# Patient Record
Sex: Female | Born: 1940 | Race: White | Hispanic: No | Marital: Married | Smoking: Never smoker
Health system: Southern US, Community
[De-identification: ages and names within clinical notes are randomized; demographics above are authoritative.]

## PROBLEM LIST (undated history)

## (undated) DIAGNOSIS — E785 Hyperlipidemia, unspecified: Secondary | ICD-10-CM

## (undated) DIAGNOSIS — D219 Benign neoplasm of connective and other soft tissue, unspecified: Secondary | ICD-10-CM

## (undated) DIAGNOSIS — E039 Hypothyroidism, unspecified: Secondary | ICD-10-CM

## (undated) DIAGNOSIS — T7840XA Allergy, unspecified, initial encounter: Secondary | ICD-10-CM

## (undated) DIAGNOSIS — B009 Herpesviral infection, unspecified: Secondary | ICD-10-CM

## (undated) DIAGNOSIS — K589 Irritable bowel syndrome without diarrhea: Secondary | ICD-10-CM

## (undated) DIAGNOSIS — J45909 Unspecified asthma, uncomplicated: Secondary | ICD-10-CM

## (undated) DIAGNOSIS — I491 Atrial premature depolarization: Secondary | ICD-10-CM

## (undated) DIAGNOSIS — K5790 Diverticulosis of intestine, part unspecified, without perforation or abscess without bleeding: Secondary | ICD-10-CM

## (undated) DIAGNOSIS — R42 Dizziness and giddiness: Secondary | ICD-10-CM

## (undated) HISTORY — DX: Benign neoplasm of connective and other soft tissue, unspecified: D21.9

## (undated) HISTORY — DX: Hyperlipidemia, unspecified: E78.5

## (undated) HISTORY — DX: Unspecified asthma, uncomplicated: J45.909

## (undated) HISTORY — PX: CHOLECYSTECTOMY: SHX55

## (undated) HISTORY — DX: Herpesviral infection, unspecified: B00.9

## (undated) HISTORY — DX: Irritable bowel syndrome, unspecified: K58.9

## (undated) HISTORY — DX: Atrial premature depolarization: I49.1

## (undated) HISTORY — DX: Diverticulosis of intestine, part unspecified, without perforation or abscess without bleeding: K57.90

## (undated) HISTORY — DX: Dizziness and giddiness: R42

## (undated) HISTORY — PX: EYE SURGERY: SHX253

## (undated) HISTORY — DX: Allergy, unspecified, initial encounter: T78.40XA

## (undated) HISTORY — PX: ABDOMINAL HYSTERECTOMY: SHX81

## (undated) HISTORY — PX: CATARACT EXTRACTION: SUR2

## (undated) HISTORY — PX: SEPTOPLASTY: SUR1290

## (undated) HISTORY — PX: THIGH FASCIOTOMY: SHX2495

---

## 1970-10-26 HISTORY — PX: REPAIR FASCIAL DEFECT LEG: SUR1172

## 1998-08-14 ENCOUNTER — Ambulatory Visit (HOSPITAL_COMMUNITY): Admission: RE | Admit: 1998-08-14 | Discharge: 1998-08-14 | Payer: Self-pay | Admitting: Specialist

## 1998-10-23 ENCOUNTER — Ambulatory Visit (HOSPITAL_COMMUNITY): Admission: RE | Admit: 1998-10-23 | Discharge: 1998-10-23 | Payer: Self-pay | Admitting: Specialist

## 1999-01-15 ENCOUNTER — Ambulatory Visit (HOSPITAL_COMMUNITY): Admission: RE | Admit: 1999-01-15 | Discharge: 1999-01-15 | Payer: Self-pay | Admitting: Specialist

## 1999-10-27 HISTORY — PX: OTHER SURGICAL HISTORY: SHX169

## 2000-03-16 ENCOUNTER — Encounter (INDEPENDENT_AMBULATORY_CARE_PROVIDER_SITE_OTHER): Payer: Self-pay

## 2000-03-16 ENCOUNTER — Ambulatory Visit (HOSPITAL_COMMUNITY): Admission: RE | Admit: 2000-03-16 | Discharge: 2000-03-16 | Payer: Self-pay | Admitting: Gastroenterology

## 2000-10-14 ENCOUNTER — Other Ambulatory Visit: Admission: RE | Admit: 2000-10-14 | Discharge: 2000-10-14 | Payer: Self-pay | Admitting: Obstetrics and Gynecology

## 2001-11-14 ENCOUNTER — Other Ambulatory Visit: Admission: RE | Admit: 2001-11-14 | Discharge: 2001-11-14 | Payer: Self-pay | Admitting: Obstetrics and Gynecology

## 2001-12-23 ENCOUNTER — Emergency Department (HOSPITAL_COMMUNITY): Admission: EM | Admit: 2001-12-23 | Discharge: 2001-12-23 | Payer: Self-pay | Admitting: Emergency Medicine

## 2001-12-23 ENCOUNTER — Encounter: Payer: Self-pay | Admitting: Emergency Medicine

## 2002-05-19 ENCOUNTER — Encounter: Admission: RE | Admit: 2002-05-19 | Discharge: 2002-05-19 | Payer: Self-pay | Admitting: Obstetrics and Gynecology

## 2002-05-19 ENCOUNTER — Encounter: Payer: Self-pay | Admitting: Obstetrics and Gynecology

## 2003-05-22 ENCOUNTER — Encounter: Admission: RE | Admit: 2003-05-22 | Discharge: 2003-05-22 | Payer: Self-pay | Admitting: Obstetrics and Gynecology

## 2003-05-22 ENCOUNTER — Encounter: Payer: Self-pay | Admitting: Obstetrics and Gynecology

## 2004-06-11 ENCOUNTER — Encounter: Admission: RE | Admit: 2004-06-11 | Discharge: 2004-06-11 | Payer: Self-pay | Admitting: Obstetrics and Gynecology

## 2005-03-20 ENCOUNTER — Ambulatory Visit: Payer: Self-pay | Admitting: Internal Medicine

## 2005-07-27 ENCOUNTER — Encounter: Admission: RE | Admit: 2005-07-27 | Discharge: 2005-07-27 | Payer: Self-pay | Admitting: Obstetrics and Gynecology

## 2005-08-14 ENCOUNTER — Ambulatory Visit: Payer: Self-pay | Admitting: Internal Medicine

## 2005-09-04 ENCOUNTER — Ambulatory Visit: Payer: Self-pay | Admitting: Internal Medicine

## 2005-10-02 ENCOUNTER — Ambulatory Visit: Payer: Self-pay | Admitting: Internal Medicine

## 2006-03-19 ENCOUNTER — Ambulatory Visit: Payer: Self-pay | Admitting: Internal Medicine

## 2006-07-30 ENCOUNTER — Encounter: Admission: RE | Admit: 2006-07-30 | Discharge: 2006-07-30 | Payer: Self-pay | Admitting: Obstetrics and Gynecology

## 2007-08-02 DIAGNOSIS — J45909 Unspecified asthma, uncomplicated: Secondary | ICD-10-CM | POA: Insufficient documentation

## 2007-08-02 DIAGNOSIS — Z860100 Personal history of colon polyps, unspecified: Secondary | ICD-10-CM | POA: Insufficient documentation

## 2007-08-08 ENCOUNTER — Ambulatory Visit: Payer: Self-pay | Admitting: Internal Medicine

## 2007-08-08 DIAGNOSIS — E785 Hyperlipidemia, unspecified: Secondary | ICD-10-CM

## 2007-08-08 DIAGNOSIS — R5381 Other malaise: Secondary | ICD-10-CM | POA: Insufficient documentation

## 2007-08-08 DIAGNOSIS — R5383 Other fatigue: Secondary | ICD-10-CM

## 2007-08-08 DIAGNOSIS — E7800 Pure hypercholesterolemia, unspecified: Secondary | ICD-10-CM | POA: Insufficient documentation

## 2007-08-08 DIAGNOSIS — M25559 Pain in unspecified hip: Secondary | ICD-10-CM

## 2007-08-08 LAB — CONVERTED CEMR LAB
Blood in Urine, dipstick: NEGATIVE
Protein, U semiquant: NEGATIVE
Specific Gravity, Urine: 1.02
Urobilinogen, UA: NEGATIVE
WBC Urine, dipstick: NEGATIVE

## 2007-08-09 ENCOUNTER — Encounter: Admission: RE | Admit: 2007-08-09 | Discharge: 2007-08-09 | Payer: Self-pay | Admitting: Obstetrics and Gynecology

## 2007-08-09 ENCOUNTER — Ambulatory Visit: Payer: Self-pay | Admitting: Internal Medicine

## 2007-08-10 ENCOUNTER — Encounter (INDEPENDENT_AMBULATORY_CARE_PROVIDER_SITE_OTHER): Payer: Self-pay | Admitting: *Deleted

## 2007-08-29 ENCOUNTER — Telehealth (INDEPENDENT_AMBULATORY_CARE_PROVIDER_SITE_OTHER): Payer: Self-pay | Admitting: *Deleted

## 2007-09-05 ENCOUNTER — Telehealth (INDEPENDENT_AMBULATORY_CARE_PROVIDER_SITE_OTHER): Payer: Self-pay | Admitting: *Deleted

## 2007-09-05 ENCOUNTER — Encounter (INDEPENDENT_AMBULATORY_CARE_PROVIDER_SITE_OTHER): Payer: Self-pay | Admitting: *Deleted

## 2007-12-09 ENCOUNTER — Ambulatory Visit: Payer: Self-pay | Admitting: Internal Medicine

## 2007-12-16 ENCOUNTER — Telehealth (INDEPENDENT_AMBULATORY_CARE_PROVIDER_SITE_OTHER): Payer: Self-pay | Admitting: *Deleted

## 2007-12-19 ENCOUNTER — Encounter (INDEPENDENT_AMBULATORY_CARE_PROVIDER_SITE_OTHER): Payer: Self-pay | Admitting: *Deleted

## 2007-12-19 LAB — CONVERTED CEMR LAB
ALT: 25 units/L (ref 0–35)
AST: 25 units/L (ref 0–37)
Albumin: 3.7 g/dL (ref 3.5–5.2)
Alkaline Phosphatase: 46 units/L (ref 39–117)
LDL Cholesterol: 103 mg/dL — ABNORMAL HIGH (ref 0–99)
Total CHOL/HDL Ratio: 3.6
Total Protein: 6.7 g/dL (ref 6.0–8.3)
Triglycerides: 123 mg/dL (ref 0–149)
VLDL: 25 mg/dL (ref 0–40)

## 2008-02-14 ENCOUNTER — Telehealth (INDEPENDENT_AMBULATORY_CARE_PROVIDER_SITE_OTHER): Payer: Self-pay | Admitting: *Deleted

## 2008-03-30 ENCOUNTER — Ambulatory Visit: Payer: Self-pay | Admitting: Internal Medicine

## 2008-05-28 ENCOUNTER — Telehealth: Payer: Self-pay | Admitting: Internal Medicine

## 2008-06-06 ENCOUNTER — Ambulatory Visit: Payer: Self-pay | Admitting: Internal Medicine

## 2008-06-10 LAB — CONVERTED CEMR LAB
Cholesterol: 184 mg/dL (ref 0–200)
HDL: 40.5 mg/dL (ref >39.0)
LDL Cholesterol: 119 mg/dL — ABNORMAL HIGH (ref 0–99)
TSH: 4.07 microintl units/mL (ref 0.35–5.50)
Triglycerides: 123 mg/dL (ref 0–149)

## 2008-06-11 ENCOUNTER — Encounter (INDEPENDENT_AMBULATORY_CARE_PROVIDER_SITE_OTHER): Payer: Self-pay | Admitting: *Deleted

## 2008-06-14 ENCOUNTER — Ambulatory Visit: Payer: Self-pay | Admitting: Internal Medicine

## 2008-06-22 ENCOUNTER — Encounter: Payer: Self-pay | Admitting: Internal Medicine

## 2008-07-06 ENCOUNTER — Encounter: Payer: Self-pay | Admitting: Internal Medicine

## 2008-07-13 ENCOUNTER — Telehealth (INDEPENDENT_AMBULATORY_CARE_PROVIDER_SITE_OTHER): Payer: Self-pay | Admitting: *Deleted

## 2008-07-26 ENCOUNTER — Telehealth (INDEPENDENT_AMBULATORY_CARE_PROVIDER_SITE_OTHER): Payer: Self-pay | Admitting: *Deleted

## 2008-08-15 ENCOUNTER — Encounter: Admission: RE | Admit: 2008-08-15 | Discharge: 2008-08-15 | Payer: Self-pay | Admitting: Obstetrics and Gynecology

## 2008-09-25 ENCOUNTER — Telehealth (INDEPENDENT_AMBULATORY_CARE_PROVIDER_SITE_OTHER): Payer: Self-pay | Admitting: *Deleted

## 2008-10-01 ENCOUNTER — Telehealth (INDEPENDENT_AMBULATORY_CARE_PROVIDER_SITE_OTHER): Payer: Self-pay | Admitting: *Deleted

## 2008-10-15 ENCOUNTER — Ambulatory Visit: Payer: Self-pay | Admitting: Internal Medicine

## 2008-10-16 ENCOUNTER — Telehealth (INDEPENDENT_AMBULATORY_CARE_PROVIDER_SITE_OTHER): Payer: Self-pay | Admitting: *Deleted

## 2008-10-16 LAB — CONVERTED CEMR LAB
ALT: 32 units/L (ref 0–35)
Alkaline Phosphatase: 59 units/L (ref 39–117)
CO2: 29 meq/L (ref 19–32)
Chloride: 108 meq/L (ref 96–112)
Creatinine, Ser: 0.8 mg/dL (ref 0.4–1.2)
GFR calc Af Amer: 93 mL/min
HDL: 50 mg/dL (ref >39.0)
LDL Cholesterol: 116 mg/dL — ABNORMAL HIGH (ref 0–99)
Potassium: 4.1 meq/L (ref 3.5–5.1)
Sodium: 142 meq/L (ref 135–145)
TSH: 0.09 microintl units/mL — ABNORMAL LOW (ref 0.35–5.50)
Total CHOL/HDL Ratio: 3.9

## 2008-10-18 ENCOUNTER — Ambulatory Visit: Payer: Self-pay | Admitting: Internal Medicine

## 2008-10-18 DIAGNOSIS — R946 Abnormal results of thyroid function studies: Secondary | ICD-10-CM

## 2008-10-18 DIAGNOSIS — K5730 Diverticulosis of large intestine without perforation or abscess without bleeding: Secondary | ICD-10-CM | POA: Insufficient documentation

## 2008-10-18 LAB — CONVERTED CEMR LAB
HDL goal, serum: 50 mg/dL
LDL Goal: 100 mg/dL

## 2008-10-31 ENCOUNTER — Telehealth (INDEPENDENT_AMBULATORY_CARE_PROVIDER_SITE_OTHER): Payer: Self-pay | Admitting: *Deleted

## 2008-12-19 ENCOUNTER — Ambulatory Visit: Payer: Self-pay | Admitting: Internal Medicine

## 2008-12-19 LAB — CONVERTED CEMR LAB
ALT: 21 units/L (ref 0–35)
AST: 21 units/L (ref 0–37)
Albumin: 3.9 g/dL (ref 3.5–5.2)
Alkaline Phosphatase: 45 units/L (ref 39–117)
Bilirubin, Direct: 0.1 mg/dL (ref 0.0–0.3)
Cholesterol: 207 mg/dL (ref 0–200)
Direct LDL: 123.6 mg/dL
Free T4: 0.7 ng/dL (ref 0.6–1.6)
HDL: 50.8 mg/dL (ref >39.0)
T3, Free: 3.1 pg/mL (ref 2.3–4.2)
TSH: 12.27 microintl units/mL — ABNORMAL HIGH (ref 0.35–5.50)
Total Bilirubin: 0.7 mg/dL (ref 0.3–1.2)
Total CHOL/HDL Ratio: 4.1
Total Protein: 6.9 g/dL (ref 6.0–8.3)
Triglycerides: 128 mg/dL (ref 0–149)
VLDL: 26 mg/dL (ref 0–40)

## 2008-12-20 ENCOUNTER — Ambulatory Visit: Payer: Self-pay | Admitting: Internal Medicine

## 2008-12-25 ENCOUNTER — Telehealth (INDEPENDENT_AMBULATORY_CARE_PROVIDER_SITE_OTHER): Payer: Self-pay | Admitting: *Deleted

## 2009-01-08 ENCOUNTER — Telehealth (INDEPENDENT_AMBULATORY_CARE_PROVIDER_SITE_OTHER): Payer: Self-pay | Admitting: *Deleted

## 2009-01-14 ENCOUNTER — Telehealth (INDEPENDENT_AMBULATORY_CARE_PROVIDER_SITE_OTHER): Payer: Self-pay | Admitting: *Deleted

## 2009-04-05 ENCOUNTER — Ambulatory Visit: Payer: Self-pay | Admitting: Internal Medicine

## 2009-04-06 LAB — CONVERTED CEMR LAB: TSH: 2.33 microintl units/mL (ref 0.35–5.50)

## 2009-04-08 ENCOUNTER — Encounter (INDEPENDENT_AMBULATORY_CARE_PROVIDER_SITE_OTHER): Payer: Self-pay | Admitting: *Deleted

## 2009-04-15 ENCOUNTER — Telehealth (INDEPENDENT_AMBULATORY_CARE_PROVIDER_SITE_OTHER): Payer: Self-pay | Admitting: *Deleted

## 2009-05-10 ENCOUNTER — Ambulatory Visit: Payer: Self-pay | Admitting: Internal Medicine

## 2009-06-03 ENCOUNTER — Telehealth (INDEPENDENT_AMBULATORY_CARE_PROVIDER_SITE_OTHER): Payer: Self-pay | Admitting: *Deleted

## 2009-06-07 ENCOUNTER — Telehealth (INDEPENDENT_AMBULATORY_CARE_PROVIDER_SITE_OTHER): Payer: Self-pay | Admitting: *Deleted

## 2009-07-09 ENCOUNTER — Telehealth (INDEPENDENT_AMBULATORY_CARE_PROVIDER_SITE_OTHER): Payer: Self-pay | Admitting: *Deleted

## 2009-08-09 ENCOUNTER — Encounter: Payer: Self-pay | Admitting: Internal Medicine

## 2009-08-20 ENCOUNTER — Encounter: Admission: RE | Admit: 2009-08-20 | Discharge: 2009-08-20 | Payer: Self-pay | Admitting: Obstetrics and Gynecology

## 2009-09-16 ENCOUNTER — Ambulatory Visit: Payer: Self-pay | Admitting: Internal Medicine

## 2009-09-18 ENCOUNTER — Telehealth (INDEPENDENT_AMBULATORY_CARE_PROVIDER_SITE_OTHER): Payer: Self-pay | Admitting: *Deleted

## 2009-09-24 LAB — CONVERTED CEMR LAB: TSH: 2.81 microintl units/mL (ref 0.35–5.50)

## 2009-09-25 ENCOUNTER — Encounter: Payer: Self-pay | Admitting: Internal Medicine

## 2009-10-29 ENCOUNTER — Encounter (INDEPENDENT_AMBULATORY_CARE_PROVIDER_SITE_OTHER): Payer: Self-pay | Admitting: *Deleted

## 2009-10-29 ENCOUNTER — Telehealth (INDEPENDENT_AMBULATORY_CARE_PROVIDER_SITE_OTHER): Payer: Self-pay | Admitting: *Deleted

## 2009-11-11 ENCOUNTER — Ambulatory Visit: Payer: Self-pay | Admitting: Internal Medicine

## 2009-12-10 ENCOUNTER — Telehealth (INDEPENDENT_AMBULATORY_CARE_PROVIDER_SITE_OTHER): Payer: Self-pay | Admitting: *Deleted

## 2010-01-29 ENCOUNTER — Telehealth (INDEPENDENT_AMBULATORY_CARE_PROVIDER_SITE_OTHER): Payer: Self-pay | Admitting: *Deleted

## 2010-01-30 ENCOUNTER — Telehealth (INDEPENDENT_AMBULATORY_CARE_PROVIDER_SITE_OTHER): Payer: Self-pay | Admitting: *Deleted

## 2010-03-10 ENCOUNTER — Ambulatory Visit: Payer: Self-pay | Admitting: Internal Medicine

## 2010-03-10 DIAGNOSIS — H9319 Tinnitus, unspecified ear: Secondary | ICD-10-CM

## 2010-03-10 DIAGNOSIS — H9313 Tinnitus, bilateral: Secondary | ICD-10-CM | POA: Insufficient documentation

## 2010-03-10 DIAGNOSIS — R259 Unspecified abnormal involuntary movements: Secondary | ICD-10-CM

## 2010-03-10 DIAGNOSIS — D179 Benign lipomatous neoplasm, unspecified: Secondary | ICD-10-CM | POA: Insufficient documentation

## 2010-05-06 ENCOUNTER — Encounter: Payer: Self-pay | Admitting: Internal Medicine

## 2010-05-19 ENCOUNTER — Encounter: Payer: Self-pay | Admitting: Internal Medicine

## 2010-08-11 ENCOUNTER — Encounter: Payer: Self-pay | Admitting: Internal Medicine

## 2010-08-20 ENCOUNTER — Encounter: Admission: RE | Admit: 2010-08-20 | Discharge: 2010-08-20 | Payer: Self-pay | Admitting: Obstetrics and Gynecology

## 2010-11-23 LAB — CONVERTED CEMR LAB
ALT: 22 units/L (ref 0–35)
ALT: 26 units/L (ref 0–35)
Albumin: 4.1 g/dL (ref 3.5–5.2)
Alkaline Phosphatase: 51 units/L (ref 39–117)
BUN: 16 mg/dL (ref 6–23)
BUN: 16 mg/dL (ref 6–23)
Basophils Absolute: 0 10*3/uL (ref 0.0–0.1)
Basophils Relative: 0.7 % (ref 0.0–1.0)
Basophils Relative: 0.7 % (ref 0.0–3.0)
Bilirubin, Direct: 0.1 mg/dL (ref 0.0–0.3)
Bilirubin, Direct: 0.1 mg/dL (ref 0.0–0.3)
CO2: 27 meq/L (ref 19–32)
Cholesterol: 197 mg/dL (ref 0–200)
Creatinine, Ser: 0.8 mg/dL (ref 0.4–1.2)
Eosinophils Absolute: 0.2 10*3/uL (ref 0.0–0.7)
Eosinophils Relative: 3.1 % (ref 0.0–5.0)
Free T4: 0.9 ng/dL (ref 0.6–1.6)
GFR calc Af Amer: 93 mL/min
Glucose, Bld: 92 mg/dL (ref 70–99)
HDL: 54.7 mg/dL (ref >39.0)
Hemoglobin: 13.4 g/dL (ref 12.0–15.0)
Lymphocytes Relative: 36.2 % (ref 12.0–46.0)
Lymphs Abs: 1.9 10*3/uL (ref 0.7–4.0)
MCHC: 34.2 g/dL (ref 30.0–36.0)
MCV: 89.6 fL (ref 78.0–100.0)
MCV: 89.6 fL (ref 78.0–100.0)
Monocytes Relative: 5.4 % (ref 3.0–12.0)
Monocytes Relative: 7.5 % (ref 3.0–11.0)
Neutro Abs: 2.9 10*3/uL (ref 1.4–7.7)
Neutro Abs: 3.1 10*3/uL (ref 1.4–7.7)
Neutrophils Relative %: 50.6 % (ref 43.0–77.0)
Platelets: 198 10*3/uL (ref 150.0–400.0)
RDW: 14.6 % (ref 11.5–14.6)
Sodium: 140 meq/L (ref 135–145)
Sodium: 144 meq/L (ref 135–145)
TSH: 4.2 microintl units/mL (ref 0.35–5.50)
Total Bilirubin: 0.6 mg/dL (ref 0.3–1.2)
Total Bilirubin: 0.8 mg/dL (ref 0.3–1.2)
Total CHOL/HDL Ratio: 4
Triglycerides: 199 mg/dL — ABNORMAL HIGH (ref 0–149)
VLDL: 44.8 mg/dL — ABNORMAL HIGH (ref 0.0–40.0)
WBC: 5.3 10*3/uL (ref 4.5–10.5)
WBC: 6.1 10*3/uL (ref 4.5–10.5)

## 2010-11-25 NOTE — Assessment & Plan Note (Signed)
Summary: MED REFILL///SPH   Vital Signs:  Patient profile:   70 year old female Height:      67.25 inches Weight:      197.6 pounds BMI:     30.83 Temp:     98.3 degrees F oral Pulse rate:   69 / minute Resp:     14 per minute BP sitting:   132 / 68  (left arm) Cuff size:   large  Vitals Entered By: Shonna Chock (Mar 10, 2010 11:16 AM) CC: 1.) Yearly Follow-up with fasting labs 2.) Tremours still present  3.)Ringing in ears worse x couple months 4.)Discuss Inhaler Use 5.) Cyst on right shoulder 6.)Sleep and memory issues Comments REVIEWED MED LIST, PATIENT AGREED DOSE AND INSTRUCTION CORRECT    CC:  1.) Yearly Follow-up with fasting labs 2.) Tremours still present  3.)Ringing in ears worse x couple months 4.)Discuss Inhaler Use 5.) Cyst on right shoulder 6.)Sleep and memory issues.  History of Present Illness: Melissa Lowe is here for med refill; she has several active concerns.The  #1 concern  is tremor which   initially  was of hands & chin > 12 months ago. Now it is mainly in L hand & in chin, especially @ rest. There is no FH of [place]'s; but her mother, now 59, has been noted to have hand tremors for 2-3 years. #2 concern  is increasing tinnitus R > L.No excess noise or ASA exposure. #3 ,she has to use albuterol pre -exercise for past  12 months. #4 "cyst " R shoulder  Preventive Screening-Counseling & Management  Alcohol-Tobacco     Smoking Status: never  Caffeine-Diet-Exercise     Does Patient Exercise: yes  Allergies: 1)  ! Tetracycline 2)  ! Sulfa  Past History:  Past Medical History: Asthma Hyperlipidemia ; PMH elevated LFTs on Lipitor Colonic polyps, hx of Diverticulosis, colon Tremor  Past Surgical History: Septoplasty Cholecystectomy Colon polypectomy 1999; negative   except Tics in 2002; 2004;2009  Cataract extraction & lens implants Hysterectomy for fibroids(no BSO) G 0 P 0  Family History: Family History of Asthma; no FH thyroid  disease Father: cancer ,? throat Mother: dementia, tremor Siblings:  sister asthma, breast cancer , hypercoagulopathy  Social History: no diet Married Never Smoked Alcohol use-yes: socially Regular exercise-yes: sporadic  Review of Systems General:  Complains of sleep disorder; denies fatigue; Disrupted sleep with difficulty falling back to sleep. Eyes:  Denies blurring, double vision, and vision loss-both eyes. ENT:  Complains of decreased hearing; denies ear discharge, earache, nasal congestion, and sinus pressure. CV:  Complains of shortness of breath with exertion; denies chest pain or discomfort, difficulty breathing at night, difficulty breathing while lying down, palpitations, swelling of feet, and swelling of hands; EIB picture. Resp:  Complains of excessive snoring; denies cough, hypersomnolence, morning headaches, and sputum productive. GI:  Denies abdominal pain, bloody stools, dark tarry stools, and indigestion. GU:  Denies discharge, dysuria, and hematuria. MS:  Denies joint pain, low back pain, mid back pain, and thoracic pain. Derm:  Denies changes in nail beds, dryness, hair loss, lesion(s), and rash. Neuro:  Denies brief paralysis, disturbances in coordination, numbness, poor balance, tingling, and weakness. Psych:  Denies anxiety and depression. Endo:  Denies cold intolerance, excessive hunger, excessive thirst, excessive urination, and heat intolerance. Heme:  Denies abnormal bruising and bleeding.  Physical Exam  General:  well-nourished; alert,appropriate and cooperative throughout examination Head:  Normocephalic and atraumatic without obvious abnormalities. No apparent alopecia or balding. Eyes:  No corneal or conjunctival inflammation noted. EOMI. Perrla. Funduscopic exam benign, without hemorrhages, exudates or papilledema.Field of  Vision grossly normal. Ears:  External ear exam shows no significant lesions or deformities.  Otoscopic examination reveals clear  canals, tympanic membranes are intact bilaterally without bulging, retraction, inflammation or discharge. Hearing is grossly normal bilaterally. Nose:  External nasal examination shows no deformity or inflammation. Nasal mucosa are pink and moist without lesions or exudates. Mouth:  Oral mucosa and oropharynx without lesions or exudates.  Teeth in good repair. Neck:  No deformities, masses, or tenderness noted. Lungs:  Normal respiratory effort, chest expands symmetrically. Lungs are clear to auscultation, no crackles or wheezes. Heart:  Normal rate and regular rhythm. S1 and S2 normal without gallop, murmur, click, rub.S4 Abdomen:  Bowel sounds positive,abdomen soft and non-tender without masses, organomegaly or hernias noted. Genitalia:  Dr Rosalio Macadamia Msk:  No deformity or scoliosis noted of thoracic or lumbar spine.   Pulses:  R and L carotid,radial,dorsalis pedis and posterior tibial pulses are full and equal bilaterally Extremities:  No clubbing, cyanosis, edema, or deformity noted with normal full range of motion of all joints.   Neurologic:  alert & oriented X3, cranial nerves II-XII intact, strength normal in all extremities, sensation intact to light touch, gait normal, DTRs symmetrical and normal, finger-to-nose normal, and Romberg negative.  Fine tremor of hands & head Skin:  Lipoma R shoulder area Cervical Nodes:  No lymphadenopathy noted Axillary Nodes:  No palpable lymphadenopathy Psych:  memory intact for recent and remote, normally interactive, and good eye contact.     Impression & Recommendations:  Problem # 1:  TREMOR (ICD-781.0)  head & hands; Beta blocker therapy contraindicated due to RAD (asthma)  Orders: Venipuncture (54098) TLB-BMP (Basic Metabolic Panel-BMET) (80048-METABOL) TLB-CBC Platelet - w/Differential (85025-CBCD) Neurology Referral (Neuro) TLB-TSH (Thyroid Stimulating Hormone) (84443-TSH)  Problem # 2:  TINNITUS (ICD-388.30)  Orders: ENT Referral  (ENT)  Problem # 3:  ASTHMA (ICD-493.90)  EIB variant; Serevent w/o anti-inflammatory may have increased long term risk. This agent may actually  exacerbate  tremor  The following medications were removed from the medication list:    Serevent Diskus 50 Mcg/dose Aepb (Salmeterol xinafoate) .Marland Kitchen... 1 spray once daily Her updated medication list for this problem includes:    Singulair 10 Mg Tabs (Montelukast sodium) .Marland Kitchen... 1 by mouth once daily    Albuterol 90 Mcg/act Aers (Albuterol) ..... Use as needed    Symbicort 80-4.5 Mcg/act Aero (Budesonide-formoterol fumarate) .Marland Kitchen... 1-2 puffs every 12 hrs ; gaargle & spit after use  Orders: Venipuncture (11914) TLB-CBC Platelet - w/Differential (85025-CBCD)  Problem # 4:  HYPERLIPIDEMIA NEC/NOS (ICD-272.4)  Orders: Venipuncture (78295) TLB-Lipid Panel (80061-LIPID) TLB-Hepatic/Liver Function Pnl (80076-HEPATIC) EKG w/ Interpretation (93000)  Problem # 5:  THYROID FUNCTION TEST, ABNORMAL (ICD-794.5)  Problem # 6:  LIPOMA (ICD-214.9) reassured as to benign nature  Complete Medication List: 1)  Singulair 10 Mg Tabs (Montelukast sodium) .Marland Kitchen.. 1 by mouth once daily 2)  Albuterol 90 Mcg/act Aers (Albuterol) .... Use as needed 3)  Multivitamins Tabs (Multiple vitamin) .Marland Kitchen.. 1 tablet daily 4)  Bl Vitamin C 500 Mg Tabs (Ascorbic acid) .Marland Kitchen.. 1 tablet daily 5)  Viactiv 500-100-40 Chew (Calcium-vitamin d-vitamin k) .Marland Kitchen.. 1 chewable daily 6)  Menostar 14 Mcg/24hr Ptwk (Estradiol) .... Change sat 7)  Vitamin D3 1000 Unit Caps (Cholecalciferol) .Marland Kitchen.. 1 by mouth once daily 8)  Pravachol 40 Mg Tabs (pravastatin Sodium)  .Marland Kitchen.. 1 at bedtime 9)  Prilosec Otc 20 Mg  Tbec (Omeprazole magnesium) .... Take 1 tablet by mouth once a day 10)  Aspirin Adult Low Strength 81 Mg Tbec (Aspirin) .Marland Kitchen.. 1 by mouth once daily 11)  Levoxyl 25 Mcg Tabs (Levothyroxine sodium) .... 2 once daily 12)  Symbicort 80-4.5 Mcg/act Aero (Budesonide-formoterol fumarate) .Marland Kitchen.. 1-2 puffs every 12  hrs ; gaargle & spit after use  Patient Instructions: 1)  Stop Serevent due to tremor & because of potential cardiovascular risks when used alone. Prescriptions: LEVOXYL 25 MCG TABS (LEVOTHYROXINE SODIUM) 2 once daily  #180 x 3   Entered and Authorized by:   Marga Melnick MD   Signed by:   Marga Melnick MD on 03/10/2010   Method used:   Print then Give to Patient   RxID:   [id number] SYMBICORT 80-4.5 MCG/ACT AERO (BUDESONIDE-FORMOTEROL FUMARATE) 1-2 puffs every 12 hrs ; gaargle & spit after use  #1 x 11   Entered and Authorized by:   Marga Melnick MD   Signed by:   Marga Melnick MD on 03/10/2010   Method used:   Print then Give to Patient   RxID:   [id number] PRAVACHOL 40 MG  TABS (PRAVASTATIN SODIUM) 1 at bedtime  #90 x 3   Entered and Authorized by:   Marga Melnick MD   Signed by:   Marga Melnick MD on 03/10/2010   Method used:   Print then Give to Patient   RxID:   [id number] ALBUTEROL 90 MCG/ACT  AERS (ALBUTEROL) use as needed  #3 x 2   Entered and Authorized by:   Marga Melnick MD   Signed by:   Marga Melnick MD on 03/10/2010   Method used:   Electronically to        [facility]* (retail)       [address]       [place], [place]  [id number]       Ph: [phone]       Fax: [phone]   RxID:   [id number] SINGULAIR 10 MG  TABS (MONTELUKAST SODIUM) 1 by mouth once daily  #90 x 3   Entered and Authorized by:   Marga Melnick MD   Signed by:   Marga Melnick MD on 03/10/2010   Method used:   Electronically to        [facility]* (retail)       [address]       [place], [place]  [address]       Ph: [phone]       Fax: [phone]   RxID:   [id number]

## 2010-11-25 NOTE — Progress Notes (Signed)
Summary: REFILL  Phone Note Refill Request Message from:  Fax from Pharmacy on January 29, 2010 8:36 AM  Refills Requested: Medication #1:  PRAVACHOL 40 MG  TABS (PRAVASTATIN SODIUM) 1 at bedtime**LABS DUE NOW** [facility] FAX [phone]   Method Requested: Fax to Local Pharmacy Next Appointment Scheduled: 02/07/2010 Initial call taken by: Barb Merino,  January 29, 2010 8:39 AM    Prescriptions: PRAVACHOL 40 MG  TABS (PRAVASTATIN SODIUM) 1 at bedtime**LABS DUE NOW**  #90 x 0   Entered by:   Shonna Chock   Authorized by:   Marga Melnick MD   Signed by:   Shonna Chock on 01/29/2010   Method used:   Faxed to ...       [facility]* (mail-order)             ,          Ph: [phone]       Fax: [phone]   RxID:   [id number]

## 2010-11-25 NOTE — Letter (Signed)
Summary: Primary Care Appointment Letter  [facility]  [address] [place], [place] [address]   Phone: [phone]  Fax: [phone]    10/29/2009 MRN: [id number]  [facility] [address] [place], [place]  [address]  Dear Ms. Garnetta Buddy,   Your Primary Care Physician Marga Melnick MD has indicated that:    ____X___it is time to schedule an appointment( Our records indicate that you are due for a yearly follow-up on your medications "30 min appointment" and fasting labs) This is necessary to continue refilling meds.    _______you missed your appointment on______ and need to call and          reschedule.    _______you need to have lab work done.    _______you need to schedule an appointment discuss lab or test results.    _______you need to call to reschedule your appointment that is                       scheduled on _________.     Please call our office as soon as possible. Our phone number is [phone]. Please press option 1. Our office is open 8a-12noon and 1p-5p, Monday through Friday.     Thank you,    [facility]

## 2010-11-25 NOTE — Progress Notes (Signed)
Summary: Med List Brought by Patient/Melissa Lowe  Med List Brought by Patient/Melissa Lowe   Imported By: Lanelle Bal 10/25/2008 09:40:39  _____________________________________________________________________  External Attachment:    Type:   Image     Comment:   External Document

## 2010-11-25 NOTE — Progress Notes (Signed)
Summary: refill  Phone Note Refill Request Message from:  Fax from Pharmacy on [facility] fax [phone]  Refills Requested: Medication #1:  PRAVACHOL 40 MG  TABS (PRAVASTATIN SODIUM) 1 at bedtime**LABS DUE NOW** Initial call taken by: Barb Merino,  October 29, 2009 9:20 AM    Prescriptions: PRAVACHOL 40 MG  TABS (PRAVASTATIN SODIUM) 1 at bedtime**LABS DUE NOW**  #90 x 0   Entered by:   Shonna Chock   Authorized by:   Marga Melnick MD   Signed by:   Shonna Chock on 10/29/2009   Method used:   Faxed to ...       [facility]* (mail-order)             ,          Ph: [phone]       Fax: [phone]   RxID:   [id number]

## 2010-11-25 NOTE — Miscellaneous (Signed)
Summary: [facility]  [facility]   Imported By: Lanelle Bal 08/19/2010 10:31:53  _____________________________________________________________________  External Attachment:    Type:   Image     Comment:   External Document

## 2010-11-25 NOTE — Progress Notes (Signed)
Summary: Refill Request  Phone Note Refill Request Message from:  Pharmacy on [facility] Fax #: [phone]  Refills Requested: Medication #1:  LEVOXYL 25 MCG TABS 1 once dailyX 1 week   Dosage confirmed as above?Dosage Confirmed   Supply Requested: 6 months Initial call taken by: Harold Barban,  December 10, 2009 10:39 AM    Prescriptions: LEVOXYL 25 MCG TABS (LEVOTHYROXINE SODIUM) 1 once dailyX 1 week ,then 2 once daily  #180 x 0   Entered by:   Kandice Hams   Authorized by:   Marga Melnick MD   Signed by:   Kandice Hams on 12/10/2009   Method used:   Faxed to ...       [facility]* (mail-order)             ,          Ph: [phone]       Fax: [phone]   RxID:   [id number]

## 2010-11-25 NOTE — Letter (Signed)
Summary: [facility]   Imported By: Lanelle Bal 06/04/2010 09:48:25  _____________________________________________________________________  External Attachment:    Type:   Image     Comment:   External Document

## 2010-11-25 NOTE — Progress Notes (Signed)
Summary: Refill Request  Phone Note Refill Request Message from:  Pharmacy on [facility] Fax #: [phone]  Refills Requested: Medication #1:  SINGULAIR 10 MG  TABS 1 by mouth once daily   Dosage confirmed as above?Dosage Confirmed   Supply Requested: 3 months   Notes: 1 refill Initial call taken by: Harold Barban,  January 30, 2010 8:39 AM    Prescriptions: SINGULAIR 10 MG  TABS (MONTELUKAST SODIUM) 1 by mouth once daily  #90 x 0   Entered by:   Shonna Chock   Authorized by:   Marga Melnick MD   Signed by:   Shonna Chock on 01/30/2010   Method used:   Faxed to ...       [facility]* (mail-order)             ,          Ph: [phone]       Fax: [phone]   RxID:   [id number]

## 2010-11-25 NOTE — Letter (Signed)
Summary: [facility]   Imported By: Lennie Odor 05/22/2010 11:47:24  _____________________________________________________________________  External Attachment:    Type:   Image     Comment:   External Document

## 2011-01-01 ENCOUNTER — Ambulatory Visit (INDEPENDENT_AMBULATORY_CARE_PROVIDER_SITE_OTHER): Payer: 59 | Admitting: Internal Medicine

## 2011-01-01 ENCOUNTER — Other Ambulatory Visit: Payer: Self-pay | Admitting: Internal Medicine

## 2011-01-01 ENCOUNTER — Encounter: Payer: Self-pay | Admitting: Internal Medicine

## 2011-01-01 DIAGNOSIS — R319 Hematuria, unspecified: Secondary | ICD-10-CM

## 2011-01-01 DIAGNOSIS — R1033 Periumbilical pain: Secondary | ICD-10-CM

## 2011-01-01 DIAGNOSIS — R197 Diarrhea, unspecified: Secondary | ICD-10-CM

## 2011-01-01 LAB — CONVERTED CEMR LAB
Glucose, Urine, Semiquant: NEGATIVE
Protein, U semiquant: 100

## 2011-01-01 LAB — CBC WITH DIFFERENTIAL/PLATELET
Basophils Relative: 0.3 % (ref 0.0–3.0)
Eosinophils Absolute: 0.1 10*3/uL (ref 0.0–0.7)
Lymphs Abs: 1.1 10*3/uL (ref 0.7–4.0)
Monocytes Relative: 9 % (ref 3.0–12.0)
Neutro Abs: 4.8 10*3/uL (ref 1.4–7.7)
Neutrophils Relative %: 73.1 % (ref 43.0–77.0)
RBC: 4.98 Mil/uL (ref 3.87–5.11)

## 2011-01-01 LAB — HEPATIC FUNCTION PANEL
ALT: 42 U/L — ABNORMAL HIGH (ref 0–35)
AST: 35 U/L (ref 0–37)
Alkaline Phosphatase: 60 U/L (ref 39–117)
Bilirubin, Direct: 0.3 mg/dL (ref 0.0–0.3)
Total Bilirubin: 0.7 mg/dL (ref 0.3–1.2)
Total Protein: 7.1 g/dL (ref 6.0–8.3)

## 2011-01-01 LAB — BASIC METABOLIC PANEL
BUN: 17 mg/dL (ref 6–23)
Calcium: 9.2 mg/dL (ref 8.4–10.5)
Creatinine, Ser: 1 mg/dL (ref 0.4–1.2)

## 2011-01-02 ENCOUNTER — Encounter: Payer: Self-pay | Admitting: Internal Medicine

## 2011-01-06 NOTE — Assessment & Plan Note (Signed)
Summary: fever, chills, diarrhea, stomach discomfort/cdj   Vital Signs:  Patient profile:   70 year old female Weight:      186 pounds BMI:     29.02 Temp:     98.6 degrees F oral Resp:     15 per minute BP sitting:   110 / 60  (left arm)  Vitals Entered By: Doristine Devoid CMA (January 01, 2011 12:08 PM) CC: fever up to 101.4, chills, diarrhea, and abdominal cramps xmon., Abdominal pain, Diarrhea   CC:  fever up to 101.4, chills, diarrhea, and abdominal cramps xmon., Abdominal pain, and Diarrhea.  History of Present Illness:    Onset 12/29/2010 as chills ; temp to 101.4 the next am. Now she  reports nausea, diarrhea, and anorexia, but denies vomiting, constipation, melena, and hematemesis.  The location of the pain is periumbilical.  The pain is described as intermittent , cramping in quality, and radiating to the back.  Associated symptoms include weight loss of 2# in 3 days.  The patient denies the following symptoms: chest pain, jaundice, and dark urine.  The pain has no trigger The pain is better with Hyocyamine. She took leftover Cipro & Metronidazole 03/06 &03/07 . PMH of Diverticulitis ;but  it did not present like this. It was LLQ cramping. She  reports >6 stools per day, watery/unformed stools, blood in stool, fecal urgency, nocturnal diarrhea, and fasting diarrhea, but denies mucus in stool, greasy stools, malodorous stools, fecal soiling, bloating, and gassiness.  She denies the following risk factors for diarrhea: recent antibiotic use, recent hospitalization, eating suspicious food, eating undercooked meat, eating raw eggs, eating shellfish, international travel, and drinking contaminated water.    Current Medications (verified): 1)  Singulair 10 Mg  Tabs (Montelukast Sodium) .Marland Kitchen.. 1 By Mouth Once Daily **appointment Due 02/2011** 2)  Albuterol 90 Mcg/act  Aers (Albuterol) .... Use As Needed 3)  Multivitamins   Tabs (Multiple Vitamin) .Marland Kitchen.. 1 Tablet Daily 4)  Bl Vitamin C 500 Mg   Tabs (Ascorbic Acid) .Marland Kitchen.. 1 Tablet Daily 5)  Viactiv 500-100-40  Chew (Calcium-Vitamin D-Vitamin K) .Marland Kitchen.. 1 Chewable Daily 6)  Menostar 14 Mcg/24hr Ptwk (Estradiol) .... Change Sat 7)  Vitamin D3 1000 Unit Caps (Cholecalciferol) .Marland Kitchen.. 1 By Mouth Once Daily 8)  Pravachol 40 Mg  Tabs (Pravastatin Sodium) .Marland Kitchen.. 1 At Bedtime 9)  Prilosec Otc 20 Mg  Tbec (Omeprazole Magnesium) .... Take 1 Tablet By Mouth Once A Day 10)  Aspirin Adult Low Strength 81 Mg  Tbec (Aspirin) .Marland Kitchen.. 1 By Mouth Once Daily 11)  Levoxyl 25 Mcg Tabs (Levothyroxine Sodium) .... 2 Once Daily 12)  Symbicort 80-4.5 Mcg/act Aero (Budesonide-Formoterol Fumarate) .Marland Kitchen.. 1-2 Puffs Every 12 Hrs ; [employer] After Use  Allergies (verified): 1)  ! Tetracycline 2)  ! Sulfa  Past History:  Past Medical History: Asthma Hyperlipidemia ; PMH elevated LFTs on Lipitor Colonic polyps, hx of Diverticulosis, colon; PMH of Diverticulitis Tremor  Past Surgical History: Septoplasty Cholecystectomy Colon polypectomy 1999; negative   except Tics in 2002; 2004;2009, Dr Kinnie Scales Cataract extraction & lens implants Hysterectomy for fibroids(no BSO) G 0 P 0  Physical Exam  General:  Appears uncomfortable  but in no acute distress; alert,appropriate and cooperative throughout examination Eyes:  No corneal or conjunctival inflammation noted. No icterus Mouth:  Oral mucosa and oropharynx without lesions or exudates.  Teeth in good repair. Tongue moist. Minimal uvular/ pharyngeal erythema.   Lungs:  Normal respiratory effort, chest expands symmetrically. Lungs are clear  to auscultation, no crackles or wheezes. Heart:  regular rhythm, no murmur, no rub, no JVD, no HJR, and S4 gallop.   Abdomen:  Bowel sounds positive but slightly decreased ,abdomen soft but   tender  to L of umbilicus without masses, organomegaly or hernias noted. Pulses:  R and L radial,dorsalis pedis and posterior tibial pulses are full and equal bilaterally Extremities:  No  clubbing, cyanosis, edema Skin:  Intact without suspicious lesions or rashes. Slight tenting ; no jaundice Cervical Nodes:  No lymphadenopathy noted Axillary Nodes:  No palpable lymphadenopathy; very ticklish Psych:  memory intact for recent and remote, normally interactive, and good eye contact.     Impression & Recommendations:  Problem # 1:  ABDOMINAL PAIN, PERIUMBILICAL (ICD-789.05)  Tender L lateral abdomen  Orders: Venipuncture (04540) TLB-CBC Platelet - w/Differential (85025-CBCD) TLB-Hepatic/Liver Function Pnl (80076-HEPATIC) TLB-BMP (Basic Metabolic Panel-BMET) (80048-METABOL) UA Dipstick W/ Micro (manual) (98119) T-Culture, Urine ([address])  Problem # 2:  DIARRHEA (ICD-787.91)  Orders: Venipuncture (30865) TLB-CBC Platelet - w/Differential (85025-CBCD) TLB-Hepatic/Liver Function Pnl (80076-HEPATIC) TLB-BMP (Basic Metabolic Panel-BMET) (80048-METABOL)  Her updated medication list for this problem includes:    Lonox 2.5-0.025 Mg Tabs (Diphenoxylate-atropine) .Marland Kitchen... 1 as needed for diarrhea  Complete Medication List: 1)  Singulair 10 Mg Tabs (Montelukast sodium) .Marland Kitchen.. 1 by mouth once daily **appointment due 02/2011** 2)  Albuterol 90 Mcg/act Aers (Albuterol) .... Use as needed 3)  Multivitamins Tabs (Multiple vitamin) .Marland Kitchen.. 1 tablet daily 4)  Bl Vitamin C 500 Mg Tabs (Ascorbic acid) .Marland Kitchen.. 1 tablet daily 5)  Viactiv 500-100-40 Chew (Calcium-vitamin d-vitamin k) .Marland Kitchen.. 1 chewable daily 6)  Menostar 14 Mcg/24hr Ptwk (Estradiol) .... Change sat 7)  Vitamin D3 1000 Unit Caps (Cholecalciferol) .Marland Kitchen.. 1 by mouth once daily 8)  Pravachol 40 Mg Tabs (pravastatin Sodium)  .Marland Kitchen.. 1 at bedtime 9)  Prilosec Otc 20 Mg Tbec (Omeprazole magnesium) .... Take 1 tablet by mouth once a day 10)  Aspirin Adult Low Strength 81 Mg Tbec (Aspirin) .Marland Kitchen.. 1 by mouth once daily 11)  Levoxyl 25 Mcg Tabs (Levothyroxine sodium) .... 2 once daily 12)  Symbicort 80-4.5 Mcg/act Aero (Budesonide-formoterol  fumarate) .Marland Kitchen.. 1-2 puffs every 12 hrs ; gaargle & spit after use 13)  Ciprofloxacin Hcl 500 Mg Tabs (Ciprofloxacin hcl) .Marland Kitchen.. 1 two times a day 14)  Metronidazole 500 Mg Tabs (Metronidazole) .Marland Kitchen.. 1 three times a day 15)  Lonox 2.5-0.025 Mg Tabs (Diphenoxylate-atropine) .Marland Kitchen.. 1 as needed for diarrhea   Patient Instructions: 1)  Stool cultures if diarrhea fails to resolve. 2)  Drink clear liquids only for the next 24 hours, then slowly add other liquids and food as you  tolerate them. Prescriptions: LONOX 2.5-0.025 MG TABS (DIPHENOXYLATE-ATROPINE) 1 as needed for diarrhea  #12 x 0   Entered and Authorized by:   Marga Melnick MD   Signed by:   Marga Melnick MD on 01/01/2011   Method used:   Print then Give to Patient   RxID:   [id number] METRONIDAZOLE 500 MG TABS (METRONIDAZOLE) 1 three times a day  #21 x 0   Entered and Authorized by:   Marga Melnick MD   Signed by:   Marga Melnick MD on 01/01/2011   Method used:   Electronically to        [facility] [phone]* (retail)       [address]       [place], [place]  [address]  Ph: [phone]       Fax: [phone]   RxID:   [id number] CIPROFLOXACIN HCL 500 MG TABS (CIPROFLOXACIN HCL) 1 two times a day  #14 x 0   Entered and Authorized by:   Marga Melnick MD   Signed by:   Marga Melnick MD on 01/01/2011   Method used:   Electronically to        [id number]* (retail)       [address]       [place], [place]  [address]       Ph: [phone]       Fax: [phone]   RxID:   [id number]    Orders Added: 1)  Est. Patient Level IV [88416] 2)  Venipuncture [60630] 3)  TLB-CBC Platelet - w/Differential [85025-CBCD] 4)  TLB-Hepatic/Liver Function Pnl [80076-HEPATIC] 5)  TLB-BMP (Basic Metabolic Panel-BMET) [80048-METABOL] 6)  UA Dipstick W/ Micro (manual) [81000] 7)  T-Culture, Urine [[address]-93235]    Laboratory  Results   Urine Tests   Date/Time Reported: January 01, 2011 1:37 PM   Routine Urinalysis   Color: straw Appearance: Hazy Glucose: negative   (Normal Range: Negative) Bilirubin: negative   (Normal Range: Negative) Ketone: large (80)   (Normal Range: Negative) Spec. Gravity: >=1.030   (Normal Range: 1.003-1.035) Blood: large   (Normal Range: Negative) pH: 5.0   (Normal Range: 5.0-8.0) Protein: 100   (Normal Range: Negative) Urobilinogen: negative   (Normal Range: 0-1) Nitrite: negative   (Normal Range: Negative) Leukocyte Esterace: moderate   (Normal Range: Negative)    Comments: cx sent [facility]  January 01, 2011 1:38 PM

## 2011-02-04 ENCOUNTER — Other Ambulatory Visit: Payer: Self-pay | Admitting: Internal Medicine

## 2011-02-09 ENCOUNTER — Ambulatory Visit (INDEPENDENT_AMBULATORY_CARE_PROVIDER_SITE_OTHER): Payer: 59 | Admitting: Internal Medicine

## 2011-02-09 ENCOUNTER — Encounter: Payer: Self-pay | Admitting: Internal Medicine

## 2011-02-09 VITALS — BP 118/76 | HR 72 | Temp 98.5°F | Wt 189.6 lb

## 2011-02-09 DIAGNOSIS — J309 Allergic rhinitis, unspecified: Secondary | ICD-10-CM

## 2011-02-09 DIAGNOSIS — J45909 Unspecified asthma, uncomplicated: Secondary | ICD-10-CM

## 2011-02-09 MED ORDER — FLUTICASONE PROPIONATE 50 MCG/ACT NA SUSP: 1.0000 | Freq: Every day | NASAL | Status: AC

## 2011-02-09 NOTE — Patient Instructions (Signed)
Use the fluticasone 1 spray daily to twice a day after cleansing the nares with sterile saline. This can be  A Neti Rinse or Neti pot

## 2011-02-09 NOTE — Progress Notes (Signed)
  Subjective:    Patient ID: Melissa Lowe, female    DOB: Sep 26, 1941, 70 y.o.   MRN: [id number]  HPI she has been on Symbicort with good control. The peak flows are good, in the range of 450 L per second.  Unfortunately this  Costs approximately $60 a month ($180 for 3 months).  She questions whether this is the best treatment for her. Previously she was on Serevent by itself.  Over the weekend she's had some extrinsic symptoms with itchy eyes and hard sneezing. She has drainage which is clear from her head. She's had some frontal headache/pressure. She denies fever chills or sweats or purulent secretions. He also started having some cough.       Review of Systems     Objective:   Physical Exam on exam she's in no acute distress. The nares are clear; she has some septal dislocation.   Ear  exam and oropharyngeal exam are  unremarkable.  She has no lymphadenopathy the neck or axilla.  Chest is clear without increased work of breathing or rales, rhonchi, or wheezes.  She has a slow S4 without significant murmurs or gallops.         Assessment & Plan:  #1 asthma/reactive airways disease with good control.  #2 new-onset extrinsic rhinoconjunctivitis symptoms, probably pollen induced.  Plan: #1  Neti Rinse will recommended for nasal clearing. Generic fluticasone would also be appropriate. Zyrtec at night should control the extrinsic symptoms.  She's been asked to see what would be the alternative to using Symbicort on her plan. It is critical to treat the inflammation; it was explained albuterol and Serevent would not have any anti-inflammatory effect.

## 2011-03-13 NOTE — Procedures (Signed)
[facility]  Patient:    Melissa Lowe, Melissa Lowe                    MRN: [id number] Proc. Date: 03/16/00 Adm. Date:  [id number] Disc. Date: [id number] Attending:  Deneen Harts CC:         Titus Dubin. Alwyn Ren, M.D. [facility]             Sherry A. Rosalio Macadamia, M.D.                           Procedure Report  PROCEDURE: Colonoscopic polypectomy.  ENDOSCOPIST: Griffith Citron, M.D.  INDICATIONS FOR PROCEDURE: The patient is a 70 year old white female with recurrent left lower quadrant pain beginning June 1999, approximately every six months with diagnosis of recurrent diverticulitis.  Abdominal CT revealed minimal diverticular change and no acute inflammation.  The patient is undergoing colonoscopy for neoplasia surveillance and to further evaluate etiology of her abdominal pain.  Bowel habits are regular.  No change in stool caliber.  Denies hematochezia.  Hemoccult negative January 2001.  DESCRIPTION OF PROCEDURE: After reviewing the nature of the procedure with the patient including potential risks and complications, and after discussion of alternative methods of diagnosis and treatment, informed consent was signed.  The patient was premedicated, receiving IV sedation totalling Versed 6 mg, fentanyl 75 mcg administered in divided doses prior to and during the course of the procedure.  Using an Olympus pediatric PCF-140L video colonoscope the rectum was intubated after normal digital examination.  The scope was inserted and advanced under direct vision around the entire length of the colon to the cecum, identified by the appendiceal orifice and ileocecal valve.  Preparation was excellent throughout.  The terminal ileum was intubated over its distal 10 cm, which appeared normal. Photo documentation was obtained.  The scope was then withdrawal into the cecum, where photo documentation of the appendix and ileocecal valve were obtained.  The scope was  slowly withdrawal with careful inspection of the entire colon in a retrograde manner, including retroflex view of the rectal vault.  A 5 mm polyp was located in the proximal descending colon.  This was resected with hot biopsy forceps.  Two additional sessile polyps, one at 60 cm and one at 50 cm, both approximately 8 mm in diameter, were resected with electrocautery snare, recovered, and submitted to pathology.  No additional neoplasia was identified.  Sigmoid diverticulosis was present.  This was quite mild and the diverticular pockets were quite shallow in nature.  No acute inflammation.  Muscular sigmoid contractions were appreciated.  Retroflex view of the rectal vault was normal.  The colon was decompressed and the scope withdrawal.  The patient tolerated the procedure without difficulty, being maintained on video scope monitoring, with low-flow oxygen throughout.  Time 2, technical 2, preparation 1, total score = 5.  ASSESSMENT:  1. Colon polyps, benign appearing, sessile, resected; pathology pending.  2. Diverticulosis, mild, sigmoid.  RECOMMENDATIONS:  1. Follow up pathology.  2. Post polypectomy instructions reviewed.  3. Repeat colonoscopy in three years if adenoma.  4. Antispasmodic therapy p.r.n. left lower quadrant pain. DD:  03/16/00 TD:  03/21/00 Job: [id number]

## 2011-03-29 ENCOUNTER — Other Ambulatory Visit: Payer: Self-pay | Admitting: Internal Medicine

## 2011-04-27 ENCOUNTER — Other Ambulatory Visit: Payer: Self-pay | Admitting: Internal Medicine

## 2011-04-27 NOTE — Telephone Encounter (Signed)
**  Yearly DUE**

## 2011-04-28 ENCOUNTER — Other Ambulatory Visit: Payer: Self-pay | Admitting: Internal Medicine

## 2011-04-30 ENCOUNTER — Other Ambulatory Visit: Payer: Self-pay | Admitting: *Deleted

## 2011-04-30 ENCOUNTER — Other Ambulatory Visit: Payer: Self-pay

## 2011-04-30 MED ORDER — ALBUTEROL 90 MCG/ACT IN AERS: 2.0000 | INHALATION_SPRAY | RESPIRATORY_TRACT | Status: DC | PRN

## 2011-04-30 MED ORDER — BUDESONIDE-FORMOTEROL FUMARATE 80-4.5 MCG/ACT IN AERO: 2.0000 | INHALATION_SPRAY | Freq: Two times a day (BID) | RESPIRATORY_TRACT | Status: DC

## 2011-04-30 NOTE — Telephone Encounter (Signed)
RX sent to pharmacy  

## 2011-05-25 ENCOUNTER — Telehealth: Payer: Self-pay | Admitting: Internal Medicine

## 2011-05-25 NOTE — Telephone Encounter (Signed)
Patient due for labs- appt scheduled [phone] - need lab order

## 2011-05-25 NOTE — Telephone Encounter (Signed)
Lipid/Hep 272.4/995.20 TSH 244.9.  If patient was to schedule CPX she could have full lab panel: Lipid/Hep/BMP/CBCD/TSH/Stool Cards v70.0/272/4/995/20/244.9 (If she has a supplement insurance to [employer])

## 2011-06-02 ENCOUNTER — Telehealth: Payer: Self-pay | Admitting: Internal Medicine

## 2011-06-02 ENCOUNTER — Other Ambulatory Visit: Payer: Self-pay | Admitting: Internal Medicine

## 2011-06-02 DIAGNOSIS — E039 Hypothyroidism, unspecified: Secondary | ICD-10-CM

## 2011-06-02 DIAGNOSIS — T887XXA Unspecified adverse effect of drug or medicament, initial encounter: Secondary | ICD-10-CM

## 2011-06-02 DIAGNOSIS — E785 Hyperlipidemia, unspecified: Secondary | ICD-10-CM

## 2011-06-02 NOTE — Telephone Encounter (Signed)
Pt decided to make appt to discuss diverticulitis and rectocele w/ Hop tomorrow.

## 2011-06-03 ENCOUNTER — Encounter: Payer: Self-pay | Admitting: Internal Medicine

## 2011-06-03 ENCOUNTER — Ambulatory Visit (INDEPENDENT_AMBULATORY_CARE_PROVIDER_SITE_OTHER): Payer: 59 | Admitting: Internal Medicine

## 2011-06-03 ENCOUNTER — Other Ambulatory Visit: Payer: 59

## 2011-06-03 DIAGNOSIS — E039 Hypothyroidism, unspecified: Secondary | ICD-10-CM

## 2011-06-03 DIAGNOSIS — M545 Low back pain: Secondary | ICD-10-CM

## 2011-06-03 DIAGNOSIS — T887XXA Unspecified adverse effect of drug or medicament, initial encounter: Secondary | ICD-10-CM

## 2011-06-03 DIAGNOSIS — Z86010 Personal history of colonic polyps: Secondary | ICD-10-CM

## 2011-06-03 DIAGNOSIS — E785 Hyperlipidemia, unspecified: Secondary | ICD-10-CM

## 2011-06-03 DIAGNOSIS — R1032 Left lower quadrant pain: Secondary | ICD-10-CM

## 2011-06-03 DIAGNOSIS — K5730 Diverticulosis of large intestine without perforation or abscess without bleeding: Secondary | ICD-10-CM

## 2011-06-03 LAB — LIPID PANEL
Cholesterol: 157 mg/dL (ref 0–200)
HDL: 59.2 mg/dL (ref >39.00)
Triglycerides: 66 mg/dL (ref 0.0–149.0)
VLDL: 13.2 mg/dL (ref 0.0–40.0)

## 2011-06-03 LAB — CBC WITH DIFFERENTIAL/PLATELET
Basophils Absolute: 0 10*3/uL (ref 0.0–0.1)
Basophils Relative: 0.7 % (ref 0.0–3.0)
Eosinophils Absolute: 0.1 10*3/uL (ref 0.0–0.7)
Hemoglobin: 13 g/dL (ref 12.0–15.0)
Lymphocytes Relative: 20.4 % (ref 12.0–46.0)
MCHC: 33.5 g/dL (ref 30.0–36.0)
MCV: 89.7 fl (ref 78.0–100.0)
Monocytes Absolute: 0.7 10*3/uL (ref 0.1–1.0)
Neutro Abs: 4.9 10*3/uL (ref 1.4–7.7)
Neutrophils Relative %: 67.6 % (ref 43.0–77.0)
RBC: 4.31 Mil/uL (ref 3.87–5.11)
RDW: 14.8 % — ABNORMAL HIGH (ref 11.5–14.6)

## 2011-06-03 LAB — HEPATIC FUNCTION PANEL
Albumin: 4 g/dL (ref 3.5–5.2)
Alkaline Phosphatase: 81 U/L (ref 39–117)
Total Protein: 7 g/dL (ref 6.0–8.3)

## 2011-06-03 LAB — TSH: TSH: 1.82 u[IU]/mL (ref 0.35–5.50)

## 2011-06-03 LAB — POCT URINALYSIS DIPSTICK
Bilirubin, UA: NEGATIVE
Blood, UA: NEGATIVE
Glucose, UA: NEGATIVE
Leukocytes, UA: NEGATIVE
Nitrite, UA: NEGATIVE
Urobilinogen, UA: 0.2
pH, UA: 5

## 2011-06-03 MED ORDER — TRAMADOL HCL 50 MG PO TABS: 50.0000 mg | ORAL_TABLET | Freq: Four times a day (QID) | ORAL | Status: DC | PRN

## 2011-06-03 NOTE — Patient Instructions (Addendum)
Complete the entire course of antibiotics. Review this note to make corrections as needed. Share data with your gynecologist.   She has requested a standing prescription for Cipro and metronidazole at home should she have recurrent flares of diverticulitis. I shall ask Dr. Jennye Boroughs opinion  of this course of action. If she is having recurrent diverticulitis; she should be under his direct supervision for management

## 2011-06-03 NOTE — Progress Notes (Signed)
  Subjective:    Patient ID: Melissa Lowe, female    DOB: 24-Sep-1941, 70 y.o.   MRN: [id number]  HPI ABDOMINAL PAIN: Location: LLQ  Onset: 05/29/2011   Radiation: no  Severity: up to 9 Quality: cramping  Duration: up to 10 sec but recurrent 8/3-4  Better with: Cipro & Metronidazole & soft/liquid diet  Worse with: no triggers Symptoms Nausea/Vomiting: no  Diarrhea: 8/5 Constipation: no  Melena/BRBPR: no  Anorexia: yes,   Fever/Chills: yes, 8/4  Up to 101.1  Dysuria/hematuria/pyuria: no  Rash: no  Wt loss: no   LMP: TAH 1998 for fibroids Vaginal  discharge/ bleeding: no    Past Surgeries: last colonoscopy 2010 by Dr Medoff:diverticulosis; pre- malignant polyp 2000, but none since.  While showering she has felt pressure in the rectal area. She has gone to reference sources and questions the rectocele versus enterocele.She is to be evaluated 8/16  by her new gynecologist, Dr Seymour Bars. She has had some lumbosacral area back pain intermittently since 8/3; this was treated with aspirin.       Review of Systems      Objective:   Physical Exam Gen.: Healthy and well-nourished in appearance. Alert, appropriate and cooperative throughout exam. Eyes: No corneal or conjunctival inflammation noted.No icterus Mouth: Oral mucosa and oropharynx reveal no lesions or exudates. Teeth in good repair. No oropharyngeal erythema Neck: No deformities, masses, or tenderness noted.  Lungs: Normal respiratory effort; chest expands symmetrically. Lungs are clear to auscultation without rales, wheezes, or increased work of breathing. Heart: Normal rate and rhythm. Normal S1 and S2. No gallop, click, or rub. S4 w/o  murmur. Abdomen: Bowel sounds normal; abdomen soft and essentially  nontender. No masses, organomegaly or hernias noted.                                            Musculoskeletal/extremities: No deformity or scoliosis noted of  the thoracic or lumbar spine. No clubbing, cyanosis, edema.  Hammertoe & bunions of feet  noted. Range of motion  normal .Tone & strength  normal.Nail health  Good. She lay back & sat up w/o help. Vascular: Carotid, radial artery, dorsalis pedis and  posterior tibial pulses are full and equal. No bruits present. Neurologic: Alert and oriented x3. Deep tendon reflexes symmetrical and normal.  Heel & toe walking WNL.        Skin: Intact without suspicious lesions or rashes. Lymph: No cervical, axillary, or inguinal lymphadenopathy present. Psych: Mood and affect are normal. Normally interactive                                                                                         Assessment & Plan:  #1 left lower quadrant pain; most likely low-grade diverticulitis responsive to antibiotic therapy  #2 low back pain; this is more likely low back syndrome rather than related to the rectocele, if present.  Plan: See orders and recommendations.

## 2011-06-12 ENCOUNTER — Other Ambulatory Visit: Payer: Self-pay

## 2011-06-12 MED ORDER — MONTELUKAST SODIUM 10 MG PO TABS: 10.0000 mg | ORAL_TABLET | Freq: Every day | ORAL | Status: DC

## 2011-06-12 NOTE — Telephone Encounter (Signed)
RX sent to pharmacy  

## 2011-07-15 ENCOUNTER — Other Ambulatory Visit: Payer: Self-pay | Admitting: Obstetrics & Gynecology

## 2011-07-15 DIAGNOSIS — Z1231 Encounter for screening mammogram for malignant neoplasm of breast: Secondary | ICD-10-CM

## 2011-09-01 ENCOUNTER — Ambulatory Visit
Admission: RE | Admit: 2011-09-01 | Discharge: 2011-09-01 | Disposition: A | Discharge status: Home / Self Care | Payer: 59 | Source: Ambulatory Visit | Attending: Obstetrics & Gynecology | Admitting: Obstetrics & Gynecology

## 2011-09-01 DIAGNOSIS — Z1231 Encounter for screening mammogram for malignant neoplasm of breast: Secondary | ICD-10-CM

## 2011-10-07 ENCOUNTER — Other Ambulatory Visit: Payer: Self-pay | Admitting: Internal Medicine

## 2011-10-23 ENCOUNTER — Ambulatory Visit (INDEPENDENT_AMBULATORY_CARE_PROVIDER_SITE_OTHER): Payer: 59 | Admitting: Internal Medicine

## 2011-10-23 ENCOUNTER — Encounter: Payer: Self-pay | Admitting: Internal Medicine

## 2011-10-23 DIAGNOSIS — J209 Acute bronchitis, unspecified: Secondary | ICD-10-CM

## 2011-10-23 DIAGNOSIS — J45901 Unspecified asthma with (acute) exacerbation: Secondary | ICD-10-CM

## 2011-10-23 MED ORDER — AZITHROMYCIN 250 MG PO TABS: ORAL_TABLET | ORAL | Status: AC

## 2011-10-23 NOTE — Patient Instructions (Addendum)
Increase the maintenance inhaler (Symbicort) to 2 puffs every 12 hours. Use your rescue MDI  1-2 puffs every 4 hours if having coughing and wheezing.

## 2011-10-23 NOTE — Progress Notes (Signed)
  Subjective:    Patient ID: Melissa Lowe, female    DOB: 1940-11-20, 70 y.o.   MRN: [id number]  HPI Respiratory tract infection Onset/symptoms:1 week ago as dry & nasal congestion Exposures (illness/environmental/extrinsic):no Progression of symptoms:to lower chest congestion & paroxysmal cough with scant sputum Treatments/response:Hall's lozenges & ASA with some benefit. Isolated rescue MDI use on 12/24 Present symptoms: Fever/chills/sweats:no Frontal headache:no Facial pain:no Nasal purulence:no Sore throat:no Dental pain:no Lymphadenopathy:no Wheezing/shortness of breath:no but peak flow 400 vs normal of  450 Cough/sputum/hemoptysis: sputum not visualized Pleuritic pain:no Associated extrinsic/allergic symptoms:itchy eyes/ sneezing:sneezing Smoking history:never           Review of Systems     Objective:   Physical Exam General appearance is of good health and nourishment; no acute distress or increased work of breathing is present.  No  lymphadenopathy about the head, neck, or axilla noted.   Eyes: No conjunctival inflammation or lid edema is present.   Ears:  External ear exam shows no significant lesions or deformities.  Otoscopic examination reveals clear canals, tympanic membranes are intact bilaterally without bulging, retraction, inflammation or discharge.  Nose:  External nasal examination shows no deformity or inflammation. Nasal mucosa are pink and moist without lesions or exudates. No septal dislocation .No obstruction to airflow.   Oral exam: Dental hygiene is good; lips and gums are healthy appearing.There is no oropharyngeal erythema or exudate noted.     Heart:  Normal rate and regular rhythm. S1 and S2 normal without gallop, murmur, click, rub or other extra sounds.   Lungs: She exhibits homogenous, low-grade, musical wheezing over the posterior chest. This is greatest at the mid chest level. No increased work of breathing.    Extremities:  No  cyanosis, edema, or clubbing  noted    Skin: Warm & dry           Assessment & Plan:    #1 bronchitis, acute with bronchospasm. This is superimposed on a past history of asthma. There is no suggestion of concomitant rhinosinusitis. Although there is not been a significant decrease in her peak flow recordings and she has not been using her rescue MDI; her maintenance therapy should be increased.  Plan: See orders and recommendations

## 2011-11-03 ENCOUNTER — Other Ambulatory Visit: Payer: Self-pay | Admitting: Internal Medicine

## 2011-11-10 ENCOUNTER — Other Ambulatory Visit: Payer: Self-pay | Admitting: *Deleted

## 2011-11-10 MED ORDER — LEVOTHYROXINE SODIUM 25 MCG PO TABS: ORAL_TABLET | ORAL | Status: DC

## 2011-11-10 NOTE — Telephone Encounter (Signed)
Left message on voicemail informing patient that we can correct rx and send in for a 90 day supply for patient had recent labs will will ok #180/1 refill, patient to call and verify pharmacy location

## 2011-11-10 NOTE — Telephone Encounter (Signed)
Patient called back and stated she would like rx sent to [facility]

## 2011-11-10 NOTE — Telephone Encounter (Signed)
Pt left VM that she usually get this med renewed for 90-days supply but for some reason that last couple of times it has been for 30 days.  Pt would like to know why this has been done and would like to get new Rx sent in for 90 day supply. Pt did not leave pharmacy.

## 2012-03-07 ENCOUNTER — Telehealth: Payer: Self-pay | Admitting: *Deleted

## 2012-03-07 MED ORDER — SYNTHROID 25 MCG PO TABS: 25.0000 ug | ORAL_TABLET | Freq: Every day | ORAL | Status: DC

## 2012-03-07 NOTE — Telephone Encounter (Signed)
Rx sent, left Pt detail message.  

## 2012-03-07 NOTE — Telephone Encounter (Signed)
Pt states that pharmacy advise that levothyroxine is on recall so alternative med is needed. Please advise

## 2012-03-07 NOTE — Telephone Encounter (Signed)
Can switch to Synthroid (DAW) daily

## 2012-03-14 MED ORDER — LEVOTHYROXINE SODIUM 25 MCG PO TABS: 25.0000 ug | ORAL_TABLET | Freq: Every day | ORAL | Status: DC

## 2012-03-14 NOTE — Telephone Encounter (Signed)
Pt called back stating that insurance will not cover Synthroid DAW but will do the generic levoxyl .Please advise

## 2012-03-14 NOTE — Telephone Encounter (Signed)
Addended by: Arnette Norris on: 03/14/2012 05:44 PM   Modules accepted: Orders

## 2012-03-14 NOTE — Telephone Encounter (Signed)
Ok to change but testing will need to be done in 2 months  244.9  TSH

## 2012-03-14 NOTE — Telephone Encounter (Signed)
Left message to call office

## 2012-03-14 NOTE — Telephone Encounter (Signed)
Rx faxed

## 2012-03-15 NOTE — Telephone Encounter (Signed)
Discuss with patient will call back later to schedule appt.

## 2012-03-16 ENCOUNTER — Other Ambulatory Visit: Payer: Self-pay | Admitting: *Deleted

## 2012-03-16 MED ORDER — LEVOTHYROXINE SODIUM 25 MCG PO TABS: 25.0000 ug | ORAL_TABLET | Freq: Every day | ORAL | Status: DC

## 2012-05-07 ENCOUNTER — Other Ambulatory Visit: Payer: Self-pay | Admitting: Internal Medicine

## 2012-05-12 ENCOUNTER — Other Ambulatory Visit: Payer: Self-pay | Admitting: Internal Medicine

## 2012-05-12 ENCOUNTER — Encounter: Payer: Self-pay | Admitting: Internal Medicine

## 2012-05-12 ENCOUNTER — Ambulatory Visit (INDEPENDENT_AMBULATORY_CARE_PROVIDER_SITE_OTHER): Payer: 59 | Admitting: Internal Medicine

## 2012-05-12 VITALS — BP 126/70 | HR 64 | Temp 98.5°F | Wt 194.2 lb

## 2012-05-12 DIAGNOSIS — R0789 Other chest pain: Secondary | ICD-10-CM

## 2012-05-12 DIAGNOSIS — T887XXA Unspecified adverse effect of drug or medicament, initial encounter: Secondary | ICD-10-CM

## 2012-05-12 DIAGNOSIS — R0602 Shortness of breath: Secondary | ICD-10-CM

## 2012-05-12 DIAGNOSIS — E039 Hypothyroidism, unspecified: Secondary | ICD-10-CM

## 2012-05-12 DIAGNOSIS — E785 Hyperlipidemia, unspecified: Secondary | ICD-10-CM

## 2012-05-12 LAB — CK TOTAL AND CKMB (NOT AT ARMC)
CK, MB: 2.1 ng/mL (ref 0.3–4.0)
Total CK: 76 U/L (ref 7–177)

## 2012-05-12 LAB — TROPONIN I: Troponin I: <0.01 ng/mL (ref <0.06)

## 2012-05-12 MED ORDER — AZITHROMYCIN 250 MG PO TABS: ORAL_TABLET | ORAL | Status: DC

## 2012-05-12 NOTE — Addendum Note (Signed)
Addended by: Maurice Small on: 05/12/2012 05:02 PM   Modules accepted: Orders

## 2012-05-12 NOTE — Addendum Note (Signed)
Addended by: Silvio Pate D on: 05/12/2012 05:07 PM   Modules accepted: Orders

## 2012-05-12 NOTE — Patient Instructions (Addendum)
Order for x-rays entered into  the computer; these will be performed at [address] [facility]. across from [facility]. No appointment is necessary.  Use an anti-inflammatory cream such as Aspercreme or Zostrix cream twice a day to the affected area as needed. In lieu of this warm moist compresses or  hot water bottle can be used. Do not apply ice . Consider glucosamine sulfate 1500 mg daily for joint symptoms. Take this daily  for 2-3 weeks . This will rehydrate the cartilages.  To prevent palpitations or premature beats, avoid stimulants such as decongestants, diet pills, nicotine, or caffeine (coffee, tea, cola, or chocolate) to excess.  Please try to go on My Chart within the next 24 hours to allow me to release the results directly to you.

## 2012-05-12 NOTE — Addendum Note (Signed)
Addended by: Silvio Pate D on: 05/12/2012 05:09 PM   Modules accepted: Orders

## 2012-05-12 NOTE — Progress Notes (Signed)
  Subjective:    Patient ID: Melissa Lowe, female    DOB: May 02, 1941, 71 y.o.   MRN: [id number]  HPI She has had intermittent substernal chest "dull pressure" with inspiration in the past 10 days. There was no trigger or specific injury. She questioned whether her albuterol inhaler which she used prior to the onset of her symptoms may have been contaminated with dust .  There is no radiation of this pressure; there is no associated diaphoresis or nausea. She does describe some intermittent nonproductive cough; she has had no hemoptysis or significant asthma flare.  There is no past medical history or family history of coronary disease    Review of Systems She is not having abdominal pain or dysphagia. She has not had fever, chills, or sweats. She also denies palpitations, edema, claudication     Objective:   Physical Exam Gen.:  well-nourished in appearance. Alert, appropriate and cooperative throughout exam.  Eyes: No corneal or conjunctival inflammation noted.  Mouth: Oral mucosa and oropharynx reveal no lesions or exudates. Teeth in good repair. Neck: No deformities, masses, or tenderness noted.  Lungs: Normal respiratory effort; chest expands symmetrically. Lungs are clear to auscultation without rales, wheezes, or increased work of breathing.  Chest: There is some discomfort with pressure over the anterior chest with the arms rotated posteriorly Heart: Normal rate and rhythm. Normal S1 and S2. No gallop, click, or rub. S4 w/o murmur. Abdomen: Bowel sounds normal; abdomen soft and nontender. No masses, organomegaly or hernias noted.                                                                                 Musculoskeletal/extremities: No deformity or scoliosis noted of  the thoracic or lumbar spine. No clubbing, cyanosis, edema, or deformity noted.   Joints normal. Nail health  good. Homans sign is negative bilaterally Vascular: Carotid, radial artery, dorsalis pedis and   posterior tibial pulses are full and equal. No bruits present. Neurologic: Alert and oriented x3. Deep tendon reflexes symmetrical and normal.          Skin: Intact without suspicious lesions or rashes. Lymph: No cervical, axillary lymphadenopathy present. Psych: Mood and affect are normal. Normally interactive                                                                                         Assessment & Plan:  #1 atypical chest pain; a chest wall (costochondritis) or pleuritic component is suggested by her history and the sternal tenderness. EKG reveals no ischemic changes. She does have occasional premature atrial contractions which are asymptomatic  #2 asthma without significant exacerbation  Plan: See orders and recommendations

## 2012-05-13 ENCOUNTER — Ambulatory Visit (INDEPENDENT_AMBULATORY_CARE_PROVIDER_SITE_OTHER)
Admission: RE | Admit: 2012-05-13 | Discharge: 2012-05-13 | Disposition: A | Discharge status: Home / Self Care | Payer: 59 | Source: Ambulatory Visit | Attending: Internal Medicine | Admitting: Internal Medicine

## 2012-05-13 DIAGNOSIS — R0789 Other chest pain: Secondary | ICD-10-CM

## 2012-05-16 ENCOUNTER — Telehealth: Payer: Self-pay | Admitting: Internal Medicine

## 2012-05-16 NOTE — Telephone Encounter (Signed)
05/16/12 On 05/11/12 Took call from pt and and tried to make appt to see doctor for that day for chest pressure. Pt was out of town and wanted an appt for Friday instead. Spoke with nurse in office and she stated to please tell pt to come in asap. Pt agreed to come in the following day vs two days out (which is what she requesting) Amym

## 2012-05-19 LAB — TSH: TSH: 5.353 u[IU]/mL — ABNORMAL HIGH (ref 0.350–4.500)

## 2012-05-19 LAB — LIPID PANEL: Cholesterol: 192 mg/dL (ref 0–200)

## 2012-05-19 LAB — HEPATIC FUNCTION PANEL
ALT: 20 U/L (ref 0–35)
AST: 24 U/L (ref 0–37)
Bilirubin, Direct: <0.1 mg/dL (ref 0.0–0.3)
Total Bilirubin: 0.3 mg/dL (ref 0.3–1.2)

## 2012-05-27 ENCOUNTER — Other Ambulatory Visit: Payer: 59

## 2012-05-30 ENCOUNTER — Other Ambulatory Visit (INDEPENDENT_AMBULATORY_CARE_PROVIDER_SITE_OTHER): Payer: 59

## 2012-05-30 DIAGNOSIS — T887XXA Unspecified adverse effect of drug or medicament, initial encounter: Secondary | ICD-10-CM

## 2012-05-30 DIAGNOSIS — E039 Hypothyroidism, unspecified: Secondary | ICD-10-CM

## 2012-05-30 DIAGNOSIS — E785 Hyperlipidemia, unspecified: Secondary | ICD-10-CM

## 2012-05-30 LAB — LIPID PANEL
Cholesterol: 187 mg/dL (ref 0–200)
HDL: 55.7 mg/dL (ref >39.00)
LDL Cholesterol: 102 mg/dL — ABNORMAL HIGH (ref 0–99)
VLDL: 29.4 mg/dL (ref 0.0–40.0)

## 2012-05-30 LAB — HEPATIC FUNCTION PANEL
ALT: 19 U/L (ref 0–35)
AST: 21 U/L (ref 0–37)
Albumin: 4.1 g/dL (ref 3.5–5.2)
Total Bilirubin: 0.4 mg/dL (ref 0.3–1.2)

## 2012-05-30 LAB — TSH: TSH: 4.2 u[IU]/mL (ref 0.35–5.50)

## 2012-06-10 ENCOUNTER — Other Ambulatory Visit: Payer: Self-pay | Admitting: Family Medicine

## 2012-06-10 ENCOUNTER — Other Ambulatory Visit: Payer: Self-pay | Admitting: Internal Medicine

## 2012-07-13 NOTE — Telephone Encounter (Signed)
No comment

## 2012-07-19 ENCOUNTER — Telehealth: Payer: Self-pay

## 2012-07-19 NOTE — Telephone Encounter (Signed)
Patient wants to know which flu shot you recommend her getting the regular one or the new one for people over 65.

## 2012-07-19 NOTE — Telephone Encounter (Signed)
The new vaccine is recommended for those of [place] over 65. My only reservation is our lack of experience with this. I recommend she discuss this with her pharmacist as well to be cautious

## 2012-07-20 NOTE — Telephone Encounter (Signed)
Pt informed of Dr. Frederik Pear suggestion, she will double check with her pharmacist.

## 2012-07-29 ENCOUNTER — Other Ambulatory Visit: Payer: Self-pay

## 2012-07-29 NOTE — Telephone Encounter (Signed)
Last OV 05/12/12. Last filled 06/10/12 #90 x3. Pt states need to increase dosage because pt states over heats easily, bloating. Doesn't feel as good as when taking old stronger med. Plz Advise    MW

## 2012-08-01 MED ORDER — LEVOTHYROXINE SODIUM 25 MCG PO TABS: 25.0000 ug | ORAL_TABLET | Freq: Every day | ORAL | Status: DC

## 2012-08-01 NOTE — Telephone Encounter (Signed)
Refill # 30;  schedule  Labs & follow up appt :  TSH. Diagnoses /Codes: 244.9

## 2012-08-01 NOTE — Telephone Encounter (Signed)
Rx sent spoke with pt and scheduled appt 11/09/11 10am.       MW

## 2012-08-02 ENCOUNTER — Other Ambulatory Visit: Payer: Self-pay | Admitting: Obstetrics & Gynecology

## 2012-08-02 DIAGNOSIS — Z1231 Encounter for screening mammogram for malignant neoplasm of breast: Secondary | ICD-10-CM

## 2012-08-05 ENCOUNTER — Other Ambulatory Visit: Payer: Self-pay | Admitting: Internal Medicine

## 2012-08-29 ENCOUNTER — Other Ambulatory Visit (INDEPENDENT_AMBULATORY_CARE_PROVIDER_SITE_OTHER): Payer: 59

## 2012-08-29 ENCOUNTER — Encounter: Payer: Self-pay | Admitting: Internal Medicine

## 2012-08-29 DIAGNOSIS — E039 Hypothyroidism, unspecified: Secondary | ICD-10-CM

## 2012-09-01 ENCOUNTER — Ambulatory Visit
Admission: RE | Admit: 2012-09-01 | Discharge: 2012-09-01 | Disposition: A | Discharge status: Home / Self Care | Payer: 59 | Source: Ambulatory Visit | Attending: Obstetrics & Gynecology | Admitting: Obstetrics & Gynecology

## 2012-09-01 DIAGNOSIS — Z1231 Encounter for screening mammogram for malignant neoplasm of breast: Secondary | ICD-10-CM

## 2012-09-02 ENCOUNTER — Ambulatory Visit (INDEPENDENT_AMBULATORY_CARE_PROVIDER_SITE_OTHER): Payer: 59 | Admitting: Internal Medicine

## 2012-09-02 ENCOUNTER — Encounter: Payer: Self-pay | Admitting: Internal Medicine

## 2012-09-02 VITALS — BP 114/78 | HR 80 | Wt 197.6 lb

## 2012-09-02 DIAGNOSIS — E785 Hyperlipidemia, unspecified: Secondary | ICD-10-CM

## 2012-09-02 DIAGNOSIS — R946 Abnormal results of thyroid function studies: Secondary | ICD-10-CM

## 2012-09-02 MED ORDER — LEVOTHYROXINE SODIUM 50 MCG PO TABS: 50.0000 ug | ORAL_TABLET | Freq: Every day | ORAL | Status: DC

## 2012-09-02 NOTE — Patient Instructions (Addendum)
The most common cause of elevated triglycerides is the ingestion of sugar from high fructose corn syrup sources added to processed foods & drinks.  Eat a low-fat diet with lots of fruits and vegetables, up to 7-9 servings per day. Consume less than 30 (preferably ZERO) grams of sugar per day from foods & drinks with High Fructose Corn Syrup (HFCS) sugar as #1,2,3 or # 4 on label.[employer], [employer] & [employer] do not carry products with HFCS. Please  schedule  TSH in 6-8 weeks & again in 4 months with fasting lipids.PLEASE BRING THESE INSTRUCTIONS TO FOLLOW UP  LAB APPOINTMENT.This will guarantee correct labs are drawn, eliminating need for repeat blood sampling ( needle sticks ! ). Diagnoses /Codes: 244.9, 277.7

## 2012-09-02 NOTE — Assessment & Plan Note (Signed)
As she is only been on the 50 mcg one month; no change will be made and the TSH will be checked in 6-8 weeks. She will get the generic thyroid locally verified no change in brand of the generic.

## 2012-09-02 NOTE — Progress Notes (Signed)
  Subjective:    Patient ID: Melissa Lowe, female    DOB: 01-10-1941, 71 y.o.   MRN: [id number]  HPI Approximately one month ago her thyroid supplement was increased from 25 mcg a day to 50 mcg a day. Her last TSH was 5.353 on 05/12/12. Approximately a year ago it was therapeutic at 1.82. In fact it had been therapeutic for several years dating back to 12/19/08 when it was profoundly hypothyroid at 12.27.   Review of Systems When the TSH was mildly elevated her triglycerides were noted to be 206; these had been 66 one year earlier . She does describe increased ice cream in her diet.       Objective:   Physical Exam  Gen.: well-nourished; in no acute distress Eyes: Extraocular motion intact; no lid lag or proptosis Neck: thyroid normal Heart: Normal rhythm and rate without significant murmur, gallop, or extra heart sounds Lungs: Chest clear to auscultation without rales,rales, wheezes Neuro:Deep tendon reflexes are equal and within normal limits; slight hand  tremors  Skin: Warm and dry without significant lesions or rashes; no onycholysis Psych: Normally communicative and interactive; no abnormal mood or affect clinically.         Assessment & Plan:

## 2012-09-02 NOTE — Assessment & Plan Note (Addendum)
The suboptimal corrected thyroid function may have contributed; but the major issue appears to be nutritional. Nutritional interventions discussed; fasting lipids are recommended with repeat TSH in 4 months.

## 2012-09-04 ENCOUNTER — Other Ambulatory Visit: Payer: Self-pay | Admitting: Internal Medicine

## 2012-09-06 ENCOUNTER — Ambulatory Visit: Payer: 59 | Admitting: Internal Medicine

## 2012-09-30 ENCOUNTER — Telehealth: Payer: Self-pay | Admitting: *Deleted

## 2012-09-30 NOTE — Telephone Encounter (Signed)
Per Pt insurance no PA required if Pt uses levothyroxine  (generic) instead of brand name synthroid, .Please advise PA needed.

## 2012-09-30 NOTE — Telephone Encounter (Signed)
It appears her insurance will not cover Synthroid but will cover generic levothyroxin. The generic levothyroxine will be fine as long as the specific generic is not changed with each refill as not all generics or equivalent. Her insurance company will be picking a specific generic in which they have confidence based on serum level testing.

## 2012-10-03 NOTE — Telephone Encounter (Signed)
Spoke to pharmacy Rx has already been sold and picked up by Pt so we may disregard PA request.

## 2012-10-31 ENCOUNTER — Other Ambulatory Visit: Payer: Self-pay | Admitting: Internal Medicine

## 2012-11-08 ENCOUNTER — Ambulatory Visit: Payer: 59 | Admitting: Internal Medicine

## 2012-12-13 ENCOUNTER — Encounter: Payer: Self-pay | Admitting: Internal Medicine

## 2012-12-13 ENCOUNTER — Ambulatory Visit (INDEPENDENT_AMBULATORY_CARE_PROVIDER_SITE_OTHER): Payer: 59 | Admitting: Internal Medicine

## 2012-12-13 VITALS — BP 120/78 | HR 73 | Temp 98.2°F | Wt 195.4 lb

## 2012-12-13 DIAGNOSIS — J209 Acute bronchitis, unspecified: Secondary | ICD-10-CM

## 2012-12-13 DIAGNOSIS — J45901 Unspecified asthma with (acute) exacerbation: Secondary | ICD-10-CM

## 2012-12-13 MED ORDER — AMOXICILLIN 500 MG PO CAPS: 500.0000 mg | ORAL_CAPSULE | Freq: Three times a day (TID) | ORAL | Status: DC

## 2012-12-13 MED ORDER — PREDNISONE 20 MG PO TABS: 20.0000 mg | ORAL_TABLET | Freq: Two times a day (BID) | ORAL | Status: DC

## 2012-12-13 NOTE — Patient Instructions (Addendum)
Use Symbicort as 2 puffs every 12 hours until you are using the albuterol less than twice a week. Gargle and spit after using this agent. Once you're stable you could drop back to one inhalation a day as maintenance/prophylaxis. Plain Mucinex (NOT D) for thick secretions ;force NON dairy fluids .   Nasal cleansing in the shower as discussed with lather of mild shampoo.After 10 seconds wash off lather while  exhaling through nostrils. Make sure that all residual soap is removed to prevent irritation.  Use a Neti pot daily only  as needed for significant sinus congestion; going from open side to congested side . Plain Allegra (NOT D )  160 daily , Loratidine 10 mg , OR Zyrtec 10 mg @ bedtime  as needed for itchy eyes & sneezing.

## 2012-12-13 NOTE — Progress Notes (Signed)
  Subjective:    Patient ID: Melissa Lowe, female    DOB: 07/29/1941, 72 y.o.   MRN: [id number]  HPI The respiratory tract symptoms began 12/09/12  as chest congestion w/o cough or sputum.  No known exposures to sick family, friends, work associates, allergens , or environmental triggers .  Significant active  associated symptoms include frontal headache . Cough as of 2/15 with sputum which she swallowed .Also present were  shortness of  & wheezing .    Fever  & isolated chills w/o sweats  were present .     Myalgias in shoulder area w/o arthralgias were  present  Flu shot  current        Treatment with  ASA, Mucinex DM was partially effective   There has been flare of asthma; rescue MDI used every 3-3.5 hours.   She's been using Symbicort 80 as 1 puff once daily. She feels that using it more than this causes  dizziness  No smoking hx             Review of Systems Itchy , watery eyes & sneezing were not noted. Symptoms not present include  facial pain ,dental pain ,sore throat, nasal purulence, earache or  otic discharge        Objective:   Physical Exam General appearance:good health ;well nourished; no acute distress or increased work of breathing is present.   Eyes: No conjunctival inflammation or lid edema is present.  Ears:  External ear exam shows no significant lesions or deformities.  Otoscopic examination reveals clear canals, tympanic membranes are intact bilaterally without bulging, retraction, inflammation or discharge. Nose:  External nasal examination shows no deformity or inflammation. Nasal mucosa are pink and moist without lesions or exudates. No septal dislocation or deviation.No obstruction to airflow. Hyponasal speech Oral exam: Dental hygiene is good; lips and gums are healthy appearing.There is no oropharyngeal erythema or exudate noted.  Neck:  No deformities, masses, or tenderness noted.     Heart:  Normal rate and regular rhythm.  S1 and S2 normal without gallop, murmur, click, rub or other extra sounds. Heart sounds are distant Lungs: She exhibits diffuse musical wheezing and rhonchi in all lung fields.No increased work of breathing.   Extremities:  No cyanosis, edema, or clubbing  noted  No  lymphadenopathy about the head, neck, or axilla noted.  Skin: Warm & dry           Assessment & Plan:   #1 bronchitis, acute  #2 asthma exacerbation. The pathophysiology of asthma was discussed with special reference to its inflammatory component. The risk of excess use of the beta agonist was discussed. Focus should be on treating the inflammatory component with the inhaled steroids.  Plan: See orders and recommendations

## 2013-01-09 ENCOUNTER — Other Ambulatory Visit: Payer: Self-pay | Admitting: Internal Medicine

## 2013-01-19 ENCOUNTER — Encounter (HOSPITAL_COMMUNITY): Payer: Self-pay | Admitting: Pharmacist

## 2013-01-23 ENCOUNTER — Other Ambulatory Visit: Payer: Self-pay | Admitting: Obstetrics & Gynecology

## 2013-01-23 ENCOUNTER — Encounter (HOSPITAL_COMMUNITY): Payer: Self-pay

## 2013-01-23 ENCOUNTER — Encounter (HOSPITAL_COMMUNITY)
Admission: RE | Admit: 2013-01-23 | Discharge: 2013-01-23 | Disposition: A | Discharge status: Home / Self Care | Payer: Medicare Other | Source: Ambulatory Visit | Attending: Obstetrics & Gynecology | Admitting: Obstetrics & Gynecology

## 2013-01-23 HISTORY — DX: Hypothyroidism, unspecified: E03.9

## 2013-01-23 LAB — CBC
HCT: 37.8 % (ref 36.0–46.0)
Hemoglobin: 12.4 g/dL (ref 12.0–15.0)
MCHC: 32.8 g/dL (ref 30.0–36.0)
WBC: 6.7 10*3/uL (ref 4.0–10.5)

## 2013-01-23 LAB — BASIC METABOLIC PANEL
BUN: 14 mg/dL (ref 6–23)
Chloride: 104 mEq/L (ref 96–112)
GFR calc Af Amer: 71 mL/min — ABNORMAL LOW (ref >90)
GFR calc non Af Amer: 61 mL/min — ABNORMAL LOW (ref >90)
Glucose, Bld: 139 mg/dL — ABNORMAL HIGH (ref 70–99)
Potassium: 3.5 mEq/L (ref 3.5–5.1)
Sodium: 139 mEq/L (ref 135–145)

## 2013-01-23 NOTE — Pre-Procedure Instructions (Signed)
Patient concerned about being a mouth breather, due to sinus congestion, and waking up in PACU with dry mouth.

## 2013-01-23 NOTE — Patient Instructions (Addendum)
Your procedure is scheduled on:01/30/13  Enter through the Main Entrance at :6am Pick up desk phone and dial [phone] and inform us of your arrival.  Please call [phone] if you have any problems the morning of surgery.  Remember: Do not eat or drink after midnight:Sunday   Take these meds the morning of surgery with a sip of water:thyroid med,Omeprazole, Symbicort  DO NOT wear jewelry, eye make-up, lipstick,body lotion, or dark fingernail polish. Do not shave for 48 hours prior to surgery.  If you are to be admitted after surgery, leave suitcase in car until your room has been assigned. Patients discharged on the day of surgery will not be allowed to drive home.  Your procedure is scheduled on:  Enter through the Main Entrance at : Pick up desk phone and dial [phone] and inform us of your arrival.  Please call [phone] if you have any problems the morning of surgery.  Remember: Do not eat or drink after midnight:   Take these meds the morning of surgery with a sip of water:  DO NOT wear jewelry, eye make-up, lipstick,body lotion, or dark fingernail polish. Do not shave for 48 hours prior to surgery.  If you are to be admitted after surgery, leave suitcase in car until your room has been assigned. Patients discharged on the day of surgery will not be allowed to drive home.  Your procedure is scheduled on:  Enter through the Main Entrance at : Pick up desk phone and dial [phone] and inform us of your arrival.  Please call [phone] if you have any problems the morning of surgery.  Remember: Do not eat or drink after midnight:   Take these meds the morning of surgery with a sip of water:  DO NOT wear jewelry, eye make-up, lipstick,body lotion, or dark fingernail polish. Do not shave for 48 hours prior to surgery.  If you are to be admitted after surgery, leave suitcase in car until your room has been assigned. Patients discharged on the day of surgery will not be  allowed to drive home.

## 2013-01-29 MED ORDER — DEXTROSE 5 % IV SOLN
2.0000 g | INTRAVENOUS | Status: AC
  Administered 2013-01-30: 2 g via INTRAVENOUS
  Filled 2013-01-29: qty 2

## 2013-01-30 ENCOUNTER — Encounter (HOSPITAL_COMMUNITY): Payer: Self-pay | Admitting: Anesthesiology

## 2013-01-30 ENCOUNTER — Ambulatory Visit (HOSPITAL_COMMUNITY)
Admission: RE | Admit: 2013-01-30 | Discharge: 2013-01-30 | Disposition: A | Discharge status: Home / Self Care | Payer: Medicare Other | Source: Ambulatory Visit | Attending: Obstetrics & Gynecology | Admitting: Obstetrics & Gynecology

## 2013-01-30 ENCOUNTER — Ambulatory Visit (HOSPITAL_COMMUNITY): Payer: Medicare Other | Admitting: Anesthesiology

## 2013-01-30 ENCOUNTER — Encounter (HOSPITAL_COMMUNITY)
Admission: RE | Disposition: A | Discharge status: Home / Self Care | Payer: Self-pay | Source: Ambulatory Visit | Attending: Obstetrics & Gynecology

## 2013-01-30 DIAGNOSIS — I9589 Other hypotension: Secondary | ICD-10-CM | POA: Insufficient documentation

## 2013-01-30 DIAGNOSIS — N993 Prolapse of vaginal vault after hysterectomy: Secondary | ICD-10-CM | POA: Insufficient documentation

## 2013-01-30 HISTORY — PX: ANTERIOR AND POSTERIOR REPAIR: SHX5121

## 2013-01-30 LAB — COMPREHENSIVE METABOLIC PANEL
ALT: 16 U/L (ref 0–35)
AST: 19 U/L (ref 0–37)
Alkaline Phosphatase: 48 U/L (ref 39–117)
CO2: 26 mEq/L (ref 19–32)
Chloride: 104 mEq/L (ref 96–112)
GFR calc Af Amer: 80 mL/min — ABNORMAL LOW (ref >90)
GFR calc non Af Amer: 69 mL/min — ABNORMAL LOW (ref >90)
Glucose, Bld: 91 mg/dL (ref 70–99)
Sodium: 139 mEq/L (ref 135–145)
Total Bilirubin: 0.5 mg/dL (ref 0.3–1.2)

## 2013-01-30 LAB — CBC
Hemoglobin: 13.3 g/dL (ref 12.0–15.0)
MCH: 29.6 pg (ref 26.0–34.0)
MCV: 89.3 fL (ref 78.0–100.0)
Platelets: 270 10*3/uL (ref 150–400)
RBC: 4.5 MIL/uL (ref 3.87–5.11)
WBC: 7.1 10*3/uL (ref 4.0–10.5)

## 2013-01-30 SURGERY — ANTERIOR (CYSTOCELE) AND POSTERIOR REPAIR (RECTOCELE)
Anesthesia: Monitor Anesthesia Care | Site: Perineum | Wound class: Clean Contaminated

## 2013-01-30 MED ORDER — ESTRADIOL 0.1 MG/GM VA CREA
TOPICAL_CREAM | VAGINAL | Status: DC | PRN
  Administered 2013-01-30: 1 via VAGINAL

## 2013-01-30 MED ORDER — ACETAMINOPHEN 160 MG/5ML PO SOLN: 975.0000 mg | Freq: Once | ORAL | Status: AC

## 2013-01-30 MED ORDER — PROPOFOL 10 MG/ML IV EMUL
INTRAVENOUS | Status: AC
  Filled 2013-01-30: qty 20

## 2013-01-30 MED ORDER — ONDANSETRON HCL 4 MG/2ML IJ SOLN
INTRAMUSCULAR | Status: DC | PRN
  Administered 2013-01-30: 4 mg via INTRAVENOUS

## 2013-01-30 MED ORDER — LACTATED RINGERS IV SOLN
INTRAVENOUS | Status: DC
  Administered 2013-01-30: 14:00:00 via INTRAVENOUS

## 2013-01-30 MED ORDER — PHENYLEPHRINE HCL 10 MG/ML IJ SOLN
INTRAMUSCULAR | Status: DC | PRN
  Administered 2013-01-30: 120 ug via INTRAVENOUS
  Administered 2013-01-30: 80 ug via INTRAVENOUS

## 2013-01-30 MED ORDER — DIPHENHYDRAMINE HCL 50 MG/ML IJ SOLN
INTRAMUSCULAR | Status: AC
  Filled 2013-01-30: qty 1

## 2013-01-30 MED ORDER — PHENYLEPHRINE 40 MCG/ML (10ML) SYRINGE FOR IV PUSH (FOR BLOOD PRESSURE SUPPORT)
PREFILLED_SYRINGE | INTRAVENOUS | Status: AC
  Filled 2013-01-30: qty 5

## 2013-01-30 MED ORDER — LIDOCAINE-EPINEPHRINE 1 %-1:100000 IJ SOLN
INTRAMUSCULAR | Status: DC | PRN
  Administered 2013-01-30: 20 mL

## 2013-01-30 MED ORDER — BUPIVACAINE IN DEXTROSE 0.75-8.25 % IT SOLN
INTRATHECAL | Status: DC | PRN
  Administered 2013-01-30: 1.6 mL via INTRATHECAL

## 2013-01-30 MED ORDER — DIPHENHYDRAMINE HCL 50 MG/ML IJ SOLN
INTRAMUSCULAR | Status: DC | PRN
  Administered 2013-01-30 (×3): 12.5 mg via INTRAVENOUS

## 2013-01-30 MED ORDER — PHENYLEPHRINE HCL 10 MG/ML IJ SOLN: INTRAMUSCULAR | Status: DC | PRN

## 2013-01-30 MED ORDER — PROPOFOL 10 MG/ML IV BOLUS
INTRAVENOUS | Status: DC | PRN
  Administered 2013-01-30 (×3): 10 ug via INTRAVENOUS
  Administered 2013-01-30: 20 ug via INTRAVENOUS
  Administered 2013-01-30 (×3): 10 ug via INTRAVENOUS
  Administered 2013-01-30: 20 ug via INTRAVENOUS
  Administered 2013-01-30 (×3): 10 ug via INTRAVENOUS
  Administered 2013-01-30: 20 ug via INTRAVENOUS
  Administered 2013-01-30 (×5): 10 ug via INTRAVENOUS

## 2013-01-30 MED ORDER — ESTRADIOL 0.1 MG/GM VA CREA
TOPICAL_CREAM | VAGINAL | Status: AC
  Filled 2013-01-30: qty 42.5

## 2013-01-30 MED ORDER — LACTATED RINGERS IV SOLN
INTRAVENOUS | Status: DC
  Administered 2013-01-30 (×2): via INTRAVENOUS

## 2013-01-30 MED ORDER — OXYCODONE-ACETAMINOPHEN 7.5-325 MG PO TABS: 1.0000 | ORAL_TABLET | Freq: Four times a day (QID) | ORAL | Status: DC | PRN

## 2013-01-30 MED ORDER — ONDANSETRON HCL 4 MG/2ML IJ SOLN
INTRAMUSCULAR | Status: AC
  Filled 2013-01-30: qty 2

## 2013-01-30 MED ORDER — LIDOCAINE HCL (CARDIAC) 20 MG/ML IV SOLN
INTRAVENOUS | Status: DC | PRN
  Administered 2013-01-30: 30 mg via INTRAVENOUS

## 2013-01-30 MED ORDER — OXYCODONE-ACETAMINOPHEN 5-325 MG PO TABS
1.0000 | ORAL_TABLET | ORAL | Status: DC | PRN
  Administered 2013-01-30 (×2): 1 via ORAL
  Filled 2013-01-30 (×2): qty 1

## 2013-01-30 MED ORDER — IBUPROFEN 600 MG PO TABS
600.0000 mg | ORAL_TABLET | Freq: Four times a day (QID) | ORAL | Status: DC | PRN
  Administered 2013-01-30: 600 mg via ORAL
  Filled 2013-01-30: qty 1

## 2013-01-30 MED ORDER — ACETAMINOPHEN 160 MG/5ML PO SOLN
ORAL | Status: AC
  Administered 2013-01-30: 975 mg via ORAL
  Filled 2013-01-30: qty 40.6

## 2013-01-30 MED ORDER — LIDOCAINE HCL (CARDIAC) 20 MG/ML IV SOLN
INTRAVENOUS | Status: AC
  Filled 2013-01-30: qty 5

## 2013-01-30 MED ORDER — FENTANYL CITRATE 0.05 MG/ML IJ SOLN: 25.0000 ug | INTRAMUSCULAR | Status: DC | PRN

## 2013-01-30 SURGICAL SUPPLY — 37 items
BLADE SURG 15 STRL LF C SS BP (BLADE) ×1 IMPLANT
BLADE SURG 15 STRL SS (BLADE) ×2
CATH ROBINSON RED A/P 14FR (CATHETERS) ×2 IMPLANT
CLOTH BEACON ORANGE TIMEOUT ST (SAFETY) ×2 IMPLANT
DECANTER SPIKE VIAL GLASS SM (MISCELLANEOUS) ×2 IMPLANT
DRAPE HYSTEROSCOPY (DRAPE) ×2 IMPLANT
DRAPE STERI URO 9X17 APER PCH (DRAPES) ×2 IMPLANT
GAUZE PACKING 1 X5 YD ST (GAUZE/BANDAGES/DRESSINGS) ×2 IMPLANT
GAUZE PACKING IODOFORM 1/2 (PACKING) ×2 IMPLANT
GLOVE BIO SURGEON STRL SZ 6.5 (GLOVE) ×4 IMPLANT
GLOVE BIOGEL PI IND STRL 7.0 (GLOVE) ×2 IMPLANT
GLOVE BIOGEL PI IND STRL 7.5 (GLOVE) ×1 IMPLANT
GLOVE BIOGEL PI INDICATOR 7.0 (GLOVE) ×2
GLOVE BIOGEL PI INDICATOR 7.5 (GLOVE) ×1
GLOVE SURG SS PI 7.0 STRL IVOR (GLOVE) ×10 IMPLANT
GOWN STRL REIN XL XLG (GOWN DISPOSABLE) ×8 IMPLANT
NEEDLE HYPO 22GX1.5 SAFETY (NEEDLE) ×2 IMPLANT
NEEDLE SPNL 22GX3.5 QUINCKE BK (NEEDLE) ×2 IMPLANT
PACK VAGINAL WOMENS (CUSTOM PROCEDURE TRAY) ×2 IMPLANT
SUT VIC AB 0 CT1 27 (SUTURE) ×1
SUT VIC AB 0 CT1 27XBRD ANBCTR (SUTURE) ×1 IMPLANT
SUT VIC AB 2-0 CT1 27 (SUTURE) ×4
SUT VIC AB 2-0 CT1 TAPERPNT 27 (SUTURE) ×2 IMPLANT
SUT VIC AB 2-0 CT2 27 (SUTURE) ×2 IMPLANT
SUT VIC AB 2-0 SH 27 (SUTURE) ×6
SUT VIC AB 2-0 SH 27XBRD (SUTURE) ×3 IMPLANT
SUT VIC AB 2-0 UR5 27 (SUTURE) ×8 IMPLANT
SUT VIC AB 3-0 SH 27 (SUTURE)
SUT VIC AB 3-0 SH 27X BRD (SUTURE) IMPLANT
SUT VIC AB 4-0 SH 27 (SUTURE) ×2
SUT VIC AB 4-0 SH 27XANBCTRL (SUTURE) ×1 IMPLANT
SUT VICRYL 0 UR6 27IN ABS (SUTURE) ×4 IMPLANT
SUT VICRYL 2 0 18  UND BR (SUTURE)
SUT VICRYL 2 0 18 UND BR (SUTURE) IMPLANT
TOWEL OR 17X24 6PK STRL BLUE (TOWEL DISPOSABLE) ×6 IMPLANT
TRAY FOLEY CATH 14FR (SET/KITS/TRAYS/PACK) ×2 IMPLANT
WATER STERILE IRR 1000ML POUR (IV SOLUTION) ×4 IMPLANT

## 2013-01-30 NOTE — Anesthesia Postprocedure Evaluation (Signed)
  Anesthesia Post-op Note  Patient: Melissa Lowe  Procedure(s) Performed: Procedure(s):  POSTERIOR REPAIR (RECTOCELE) (N/A)  Patient Location: PACU  Anesthesia Type:Spinal  Level of Consciousness: awake, alert  and oriented  Airway and Oxygen Therapy: Patient Spontanous Breathing  Post-op Pain: none  Post-op Assessment: Post-op Vital signs reviewed, Patient's Cardiovascular Status Stable, Respiratory Function Stable, Patent Airway, No signs of Nausea or vomiting, Pain level controlled, No headache and No backache  Post-op Vital Signs: Reviewed and stable  Complications: No apparent anesthesia complications

## 2013-01-30 NOTE — Anesthesia Procedure Notes (Signed)
Spinal  Patient location during procedure: OR Start time: 01/30/2013 7:41 AM Staffing Anesthesiologist: Ryelynn Guedea A. Performed by: anesthesiologist  Preanesthetic Checklist Completed: patient identified, site marked, surgical consent, pre-op evaluation, timeout performed, IV checked, risks and benefits discussed and monitors and equipment checked Spinal Block Patient position: sitting Prep: site prepped and draped and DuraPrep Patient monitoring: heart rate, cardiac monitor, continuous pulse ox and blood pressure Approach: midline Location: L3-4 Injection technique: single-shot Needle Needle type: Sprotte  Needle gauge: 24 G Needle length: 9 cm Assessment Sensory level: T6 Additional Notes Patient tolerated procedure well. Adequate sensory level.

## 2013-01-30 NOTE — Op Note (Signed)
01/30/2013  9:06 AM  PATIENT:  Melissa Lowe  72 y.o. female  PRE-OPERATIVE DIAGNOSIS:  Grade 2-3 Rectocele    POST-OPERATIVE DIAGNOSIS:  Grade 2-3 Rectocele    PROCEDURE:  Procedure(s):  POSTERIOR REPAIR (RECTOCELE)  SURGEON:  Surgeon(s): [name], MD  ASSISTANTS: none   ANESTHESIA:   spinal  PROCEDURE:  Under spinal anesthesia, the patient is in lithotomy position. She is prepped with Betadine on the suprapubic, vulvar and vaginal areas. A Foley is put in place. She is draped as usual.  A grade 2-3 over 3 rectocele is present.  Allis clamps are placed on either side of the perineum.  The posterior submucosa of the vagina is infiltrated with lidocaine 1% with epinephrine in a 1-100 concentration.  A triangular skin incision is made at the perineum with the scalpel.  And that skin was resected.  We then used the stroly scissors to dissect the submucosa and then made an incision of the posterior vaginal mucosa all the way to about 1 cm of the apex of the vagina.  We then used the scalpel and the stroly scissors to release the sub-mucosal fascia and release the rectocele.  We then used a Vicryl 2-0 to reduce the rectocele by reinforcing the fascia side-to-side in a running suture.  We then cut the excess vaginal mucosa on either side with Mayo scissors. We closed the vaginal mucosa in a running suture of Vicryl 2-0. The perineum was reinforced by 2 separate stitches of Vicryl 2-0 at the levator ani muscles.  The skin of the perineum was then closed with a subcuticular stitch of Vicryl 4-0. Hemostasis was adequate at all levels. Packing with Estrace cream was inserted in the vagina.  The patient was brought to recovery room in good and stable status.   ESTIMATED BLOOD LOSS: 50 CC   Intake/Output Summary (Last 24 hours) at 01/30/13 0906 Last data filed at 01/30/13 0830  Gross per 24 hour  Intake   1700 ml  Output    250 ml  Net   1450 ml     BLOOD ADMINISTERED:none   LOCAL  MEDICATIONS USED:  LIDOCAINE WITH EPINEPHRINE  SPECIMEN:  No Specimen  DISPOSITION OF SPECIMEN:  N/A  COUNTS:  YES  PLAN OF CARE: Transfer to PACU

## 2013-01-30 NOTE — Transfer of Care (Signed)
Immediate Anesthesia Transfer of Care Note  Patient: Melissa Lowe  Procedure(s) Performed: Procedure(s):  POSTERIOR REPAIR (RECTOCELE) (N/A)  Patient Location: PACU  Anesthesia Type:Spinal  Level of Consciousness: awake, alert  and patient cooperative  Airway & Oxygen Therapy: Patient Spontanous Breathing  Post-op Assessment: Report given to PACU RN  Post vital signs: Reviewed  Complications: No apparent anesthesia complications

## 2013-01-30 NOTE — Anesthesia Preprocedure Evaluation (Signed)
Anesthesia Evaluation  Patient identified by MRN, date of birth, ID band Patient awake    Reviewed: Allergy & Precautions, H&P , NPO status , Patient's Chart, lab work & pertinent test results, reviewed documented beta blocker date and time   History of Anesthesia Complications Negative for: history of anesthetic complications  Airway Mallampati: II TM Distance: >3 FB Neck ROM: full    Dental  (+) Teeth Intact   Pulmonary asthma (since 72 yo, rare rescue inhaler use) , Recent URI  (bronchitis in 3/14), Resolved,  breath sounds clear to auscultation  Pulmonary exam normal       Cardiovascular Exercise Tolerance: Good negative cardio ROS  Rhythm:regular Rate:Normal     Neuro/Psych  Neuromuscular disease (hand tremor L>R, occasionally lip tremor too) negative psych ROS   GI/Hepatic Neg liver ROS, GERD-  Medicated,  Endo/Other  Hypothyroidism   Renal/GU negative Renal ROS  Female GU complaint     Musculoskeletal   Abdominal   Peds  Hematology negative hematology ROS (+)   Anesthesia Other Findings   Reproductive/Obstetrics negative OB ROS                           Anesthesia Physical Anesthesia Plan  ASA: II  Anesthesia Plan: Spinal and MAC   Post-op Pain Management:    Induction:   Airway Management Planned:   Additional Equipment:   Intra-op Plan:   Post-operative Plan:   Informed Consent: I have reviewed the patients History and Physical, chart, labs and discussed the procedure including the risks, benefits and alternatives for the proposed anesthesia with the patient or authorized representative who has indicated his/her understanding and acceptance.   Dental Advisory Given  Plan Discussed with: CRNA and Surgeon  Anesthesia Plan Comments:         Anesthesia Quick Evaluation

## 2013-01-30 NOTE — Progress Notes (Signed)
Pt with hypotension bp 88/44 and pt states she is a bit dizzy, Dr Malen Gauze made aware, order received for fluid bolus, will continue to monitor and manage bp closely.

## 2013-01-30 NOTE — Discharge Summary (Signed)
  Physician Discharge Summary  Patient ID: Melissa Lowe MRN: [id number] DOB/AGE: June 03, 1941 72 y.o.  Admit date: 01/30/2013 Discharge date: 01/30/2013  Admission Diagnoses: Grade 2-3 Rectocele  57250  Discharge Diagnoses: Grade 2-3 Rectocele  57250        Active Problems:   * No active hospital problems. *   Discharged Condition: good  Hospital Course: Outpatient  Consults: None  Treatments: surgery: Colporraphy, Posterior Repair  Disposition: D/C home     Medication List    ASK your doctor about these medications       aspirin 81 MG tablet  Take 81 mg by mouth at bedtime. Stopped taking 3/24 in prep for surgery     budesonide-formoterol 80-4.5 MCG/ACT inhaler  Commonly known as:  SYMBICORT  Inhale 2 puffs into the lungs daily.     CALTRATE 600+D 600-400 MG-UNIT per chew tablet  Generic drug:  Calcium Carbonate-Vitamin D  Chew 1 tablet by mouth daily.     levothyroxine 50 MCG tablet  Commonly known as:  SYNTHROID, LEVOTHROID  Take 1 tablet (50 mcg total) by mouth daily.     MENOSTAR 14 MCG/24HR  Generic drug:  estradiol  Place 1 patch onto the skin once a week. Changes patch every Saturday.     montelukast 10 MG tablet  Commonly known as:  SINGULAIR  TAKE 1 TABLET DAILY     multivitamin capsule  Take 1 capsule by mouth daily.     omeprazole 20 MG capsule  Commonly known as:  PRILOSEC  Take 20 mg by mouth daily.     pravastatin 40 MG tablet  Commonly known as:  PRAVACHOL  TAKE 1 TABLET AT BEDTIME     vitamin C 500 MG tablet  Commonly known as:  ASCORBIC ACID  Take 500 mg by mouth daily.     Vitamin D3 1000 UNITS Caps  Take by mouth daily.           Follow-up Information   Follow up with Kameelah Minish,MARIE-LYNE, MD In 3 weeks.   Contact information:   [address] [place] [address] [phone]       Signed: [name], MD 01/30/2013, 9:21 AM

## 2013-01-30 NOTE — H&P (Signed)
LACRESIA DARWISH is an 72 y.o. female  RP:  Rectocele repair  Pertinent Gynecological History:  Blood transfusions: none Previous GYN Procedures: TAH Last mammogram: normal Last pap: normal  Menstrual History:  No LMP recorded. Patient is postmenopausal.    Past Medical History  Diagnosis Date  . Asthma   . Hypothyroidism   . Shortness of breath     related to asthma    Past Surgical History  Procedure Laterality Date  . Septoplasty    . Cholecystectomy    . Colon polypectomy      Dr Kinnie Scales  . Cataract extraction    . Abdominal hysterectomy      2  fibroids  . Repair fascial defect leg  1972    Family History  Problem Relation Age of Onset  . Dementia Mother   . Asthma Sister   . Cancer Sister     breast cancer  . Stroke Maternal Grandmother   . Stroke Paternal Grandmother   . Hypertension Neg Hx   . Heart disease Neg Hx     Social History:  reports that she has never smoked. She does not have any smokeless tobacco history on file. She reports that  drinks alcohol. She reports that she does not use illicit drugs.  Allergies:  Allergies  Allergen Reactions  . Sulfonamide Derivatives     Rash on inside of arms  . Tetracycline     itching    Prescriptions prior to admission  Medication Sig Dispense Refill  . Ascorbic Acid (VITAMIN C) 500 MG tablet Take 500 mg by mouth daily.        Marland Kitchen aspirin 81 MG tablet Take 81 mg by mouth at bedtime. Stopped taking 3/24 in prep for surgery      . budesonide-formoterol (SYMBICORT) 80-4.5 MCG/ACT inhaler Inhale 2 puffs into the lungs daily.       . Calcium Carbonate-Vitamin D (CALTRATE 600+D) 600-400 MG-UNIT per chew tablet Chew 1 tablet by mouth daily.      . Cholecalciferol (VITAMIN D3) 1000 UNITS CAPS Take by mouth daily.      Marland Kitchen estradiol (MENOSTAR) 14 MCG/24HR Place 1 patch onto the skin once a week. Changes patch every Saturday.      . levothyroxine (SYNTHROID, LEVOTHROID) 50 MCG tablet Take 1 tablet (50 mcg  total) by mouth daily.  90 tablet  1  . montelukast (SINGULAIR) 10 MG tablet TAKE 1 TABLET DAILY  90 tablet  1  . Multiple Vitamin (MULTIVITAMIN) capsule Take 1 capsule by mouth daily.        Marland Kitchen omeprazole (PRILOSEC) 20 MG capsule Take 20 mg by mouth daily.       . pravastatin (PRAVACHOL) 40 MG tablet TAKE 1 TABLET AT BEDTIME  90 tablet  1    Blood pressure 137/82, pulse 85, temperature 98.2 F (36.8 C), temperature source Oral, resp. rate 16, height 5' 7.5" (1.715 m), weight 86.183 kg (190 lb), SpO2 100.00%.   Results for orders placed during the hospital encounter of 01/30/13 (from the past 24 hour(s))  CBC     Status: None   Collection Time    01/30/13  6:30 AM      Result Value Range   WBC 7.1  4.0 - 10.5 K/uL   RBC 4.50  3.87 - 5.11 MIL/uL   Hemoglobin 13.3  12.0 - 15.0 g/dL   HCT 78.2  95.6 - 21.3 %   MCV 89.3  78.0 - 100.0 fL   MCH  29.6  26.0 - 34.0 pg   MCHC 33.1  30.0 - 36.0 g/dL   RDW 45.4  09.8 - 11.9 %   Platelets 270  150 - 400 K/uL    No results found.  Assessment/Plan:  Sxic Rectocele for rectocele repair.   Chandon Lazcano,MARIE-LYNE 01/30/2013, 7:28 AM

## 2013-01-31 ENCOUNTER — Encounter (HOSPITAL_COMMUNITY): Payer: Self-pay | Admitting: Obstetrics & Gynecology

## 2013-03-15 ENCOUNTER — Telehealth: Payer: Self-pay | Admitting: Neurology

## 2013-03-26 ENCOUNTER — Other Ambulatory Visit: Payer: Self-pay | Admitting: Internal Medicine

## 2013-03-26 DIAGNOSIS — E039 Hypothyroidism, unspecified: Secondary | ICD-10-CM

## 2013-03-27 NOTE — Telephone Encounter (Signed)
Rx sent,  Left message to advise Pt Rx sent and she needs to schedule labs appt, labs order.

## 2013-03-27 NOTE — Telephone Encounter (Signed)
Hopp please advise on refill request, last TSH was abnormal 6.357 (hospital encounter) on 01/30/13

## 2013-03-27 NOTE — Telephone Encounter (Signed)
#  30 ; repeat TSH in 2 weeks; TSH was too high 4/7. Dose may need to be changed.Code :244.9

## 2013-03-29 NOTE — Telephone Encounter (Signed)
Patient is calling in to discuss thyroid medication. States that she does not feel the medication is working. Patient wants to discuss this before scheduling lab.

## 2013-03-29 NOTE — Telephone Encounter (Signed)
Discuss with patient, appointment schedule.

## 2013-04-12 ENCOUNTER — Other Ambulatory Visit (INDEPENDENT_AMBULATORY_CARE_PROVIDER_SITE_OTHER): Payer: Medicare Other

## 2013-04-12 ENCOUNTER — Other Ambulatory Visit: Payer: 59

## 2013-04-12 DIAGNOSIS — E039 Hypothyroidism, unspecified: Secondary | ICD-10-CM

## 2013-04-12 LAB — TSH: TSH: 2.61 u[IU]/mL (ref 0.35–5.50)

## 2013-04-19 ENCOUNTER — Ambulatory Visit: Payer: 59 | Admitting: Internal Medicine

## 2013-05-16 ENCOUNTER — Other Ambulatory Visit: Payer: Self-pay | Admitting: Internal Medicine

## 2013-05-31 ENCOUNTER — Other Ambulatory Visit: Payer: Self-pay

## 2013-06-07 ENCOUNTER — Telehealth: Payer: Self-pay | Admitting: Neurology

## 2013-07-03 ENCOUNTER — Ambulatory Visit (INDEPENDENT_AMBULATORY_CARE_PROVIDER_SITE_OTHER): Payer: Medicare Other | Admitting: Neurology

## 2013-07-03 ENCOUNTER — Other Ambulatory Visit: Payer: Self-pay | Admitting: Internal Medicine

## 2013-07-03 ENCOUNTER — Encounter: Payer: Self-pay | Admitting: Neurology

## 2013-07-03 VITALS — BP 108/67 | HR 69 | Ht 67.0 in | Wt 198.0 lb

## 2013-07-03 DIAGNOSIS — R259 Unspecified abnormal involuntary movements: Secondary | ICD-10-CM

## 2013-07-03 DIAGNOSIS — R251 Tremor, unspecified: Secondary | ICD-10-CM

## 2013-07-03 NOTE — Patient Instructions (Signed)
Overall you are doing fairly well but I do want to suggest a few things today:   Remember to drink plenty of fluid, eat healthy meals and do not skip any meals. Try to eat protein with a every meal and eat a healthy snack such as fruit or nuts in between meals. Try to keep a regular sleep-wake schedule and try to exercise daily, particularly in the form of walking, 20-30 minutes a day, if you can.   Your exam continues to be most consistent with a diagnosis of essential tremor though you do have some parkinsonian features on your exam. These findings are bradykinesia of your movements and a resting tremor. At this point, the best plan is to continue monitoring your symptoms. 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  I would like to see you back in 6 months, sooner if we need to. Please call us with any interim questions, concerns, problems, updates or refill requests.   Please also call us for any test results so we can go over those with you on the phone.  My clinical assistant and will answer any of your questions and relay your messages to me and also relay most of my messages to you.   Our phone number is [phone]. We also have an after hours call service for urgent matters and there is a physician on-call for urgent questions. For any emergencies you know to call 911 or go to the nearest emergency room

## 2013-07-03 NOTE — Progress Notes (Signed)
Provider:  Dr Melissa Lowe Referring Provider: Pecola Lawless, MD Primary Care Physician:  Melissa Melnick, MD  [id number]  HPI:  Melissa Lowe is a 72 y.o. female here as a follow up for her tremor  Overall reports doing well since her last visit, she feels she is unchanged. She continues to have a mild chin tremor, bilateral hand tremor that she notes is rest and postural. Denies any reaction or intention tremor. Tremor continues to be most notable when she is holding her head. Denies any worsening with stress anxiety. Denies any bradykinesia, no generalized slowing down, no stiffness no gait instability no falls. Continues to be active exercises on a regular basis. It is unclear if alcohol some effect on her tremor. Her tremor is made worse by her asthma medication.  Per Dr Melissa Lowe prior note from 09/2012: Onset lip tremor in 2010, progressed, now involves hands L>R, difficulty holding fork or book. No change with EtOH. Asthma medication makes her tremor worse. Tremor now involves chin, voice and hands. Her Melissa Lowe had dementia and a tremor.   Concerns/Questions:Review of Systems: Out of a complete 14 system review, the patient complains of only the following symptoms, and all other reviewed systems are negative. Other for snoring ringing in ears allergies tremor  History   Social History  . Marital Status: Married    Spouse Name: N/A    Number of Children: N/A  . Years of Education: N/A   Occupational History  . Not on file.   Social History Main Topics  . Smoking status: Never Smoker   . Smokeless tobacco: Not on file  . Alcohol Use: Yes     Comment: Wine couple days weekly  . Drug Use: No  . Sexual Activity: Not on file   Other Topics Concern  . Not on file   Social History Narrative  . No narrative on file    Family History  Problem Relation Age of Onset  . Dementia Melissa Lowe   . Asthma Sister   . Cancer Sister     breast cancer  . Stroke Maternal Grandmother   .  Stroke Paternal Grandmother   . Hypertension Neg Hx   . Heart disease Neg Hx     Past Medical History  Diagnosis Date  . Asthma   . Hypothyroidism   . Shortness of breath     related to asthma    Past Surgical History  Procedure Laterality Date  . Septoplasty    . Cholecystectomy    . Colon polypectomy      Dr Melissa Lowe  . Cataract extraction    . Abdominal hysterectomy      2  fibroids  . Repair fascial defect leg  1972  . Anterior and posterior repair N/A 01/30/2013    Procedure:  POSTERIOR REPAIR (RECTOCELE);  Surgeon: Melissa Del, MD;  Location: [facility];  Service: Gynecology;  Laterality: N/A;    Current Outpatient Prescriptions  Medication Sig Dispense Refill  . Ascorbic Acid (VITAMIN C) 500 MG tablet Take 500 mg by mouth daily.        Marland Kitchen aspirin 81 MG tablet Take 81 mg by mouth at bedtime. Stopped taking 3/24 in prep for surgery      . budesonide-formoterol (SYMBICORT) 80-4.5 MCG/ACT inhaler Inhale 2 puffs into the lungs daily.       . Calcium Carbonate-Vitamin D (CALTRATE 600+D) 600-400 MG-UNIT per chew tablet Chew 1 tablet by mouth daily.      . Cholecalciferol (VITAMIN D3)  1000 UNITS CAPS Take by mouth daily.      Marland Kitchen estradiol (MENOSTAR) 14 MCG/24HR Place 1 patch onto the skin once a week. Changes patch every Saturday.      . levothyroxine (SYNTHROID, LEVOTHROID) 50 MCG tablet TAKE 1 TABLET BY MOUTH DAILY  90 tablet  3  . montelukast (SINGULAIR) 10 MG tablet TAKE 1 TABLET DAILY  90 tablet  1  . Multiple Vitamin (MULTIVITAMIN) capsule Take 1 capsule by mouth daily.        Marland Kitchen omeprazole (PRILOSEC) 20 MG capsule Take 20 mg by mouth daily.       Marland Kitchen oxyCODONE-acetaminophen (PERCOCET) 7.5-325 MG per tablet Take 1 tablet by mouth every 6 (six) hours as needed for pain.  30 tablet  0  . pravastatin (PRAVACHOL) 40 MG tablet TAKE 1 TABLET AT BEDTIME  90 tablet  1   No current facility-administered medications for this visit.    Allergies as of 07/03/2013 - Review Complete  01/30/2013  Allergen Reaction Noted  . Sulfonamide derivatives    . Tetracycline      Vitals: There were no vitals taken for this visit. Last Weight:  Wt Readings from Last 1 Encounters:  01/30/13 190 lb (86.183 kg)   Last Height:   Ht Readings from Last 1 Encounters:  01/30/13 5' 7.5" (1.715 m)     Physical exam: Exam: Gen: NAD, conversant Eyes: anicteric sclerae, moist conjunctivae HENT: Atraumati Lungs: CTA, no wheezing, rales, rhonic                          CV: RRR, no MRG Abdomen: Soft, non-tender;  Extremities: No peripheral edema  Skin: Normal temperature, no rash,  Psych: Appropriate affect, pleasant  Neuro: MS: AA&Ox3, appropriately interactive, normal affect   Speech: fluent w/o paraphasic error  Memory: good recent and remote recall  CN: PERRL, EOMI no nystagmus, no ptosis, sensation intact to LT V1-V3 bilat, face symmetric, no weakness, hearing grossly intact, palate elevates symmetrically, shoulder shrug 5/5 bilat,  tongue protrudes midline, no fasiculations noted.  Motor: normal bulk and tone Strength: 5/5  In all extremities  Reflexes: symmetrical, bilat downgoing toes  Sens: LT intact in all extremities  Brief Motor UPDRS  Speech: tremor noted in speech  Facial Expression:wnl  Tremor: Head tremor noted Rest R1 L1 Action/postural R1 L1 Rigidity: wnl Finger taps:   R:1 L:0  Open/close hands: R:1 L:0  Foot taps: R:0 L:0  Arising from Chair: 0 Gait/FOG: Normal arm swing, steady, negative Pull test    Assessment:  After physical and neurologic examination, review of laboratory studies, imaging, neurophysiology testing and pre-existing records, assessment will be reviewed on the problem list.  Plan:  Treatment plan and additional workup will be reviewed under Problem List.  Ms. Melissa Lowe is a pleasant 72y/o woman sent in for followup evaluation of her tremor. Tremor has been ongoing for around 3-4 years, described as a  bilateral hand rest and postural tremor. She also notes a jaw and voice tremor. The symptoms have been stable for the past few years. Denies any other parkinsonian features, such as bradykinesia, stiffness rigidity. Her physical exam is pertinent for a right greater than left rest and postural tremor,.tremor and very mild bradykinesia on bilateral upper extremities right greater than left. She has symptoms and findings consistent with both essential tremor and parkinsonism, at this time feel she is still more likely an essential tremor. As her symptoms are mild, there is no  indication for medical therapy at this time. We will continue to monitor.  1)Tremor  -continue to monitor at this time -hold off on medical therapy -counseled patient on importance of good exercise and nutrition -follow up in 6 months or prn

## 2013-07-06 NOTE — Telephone Encounter (Signed)
Letter mailed and Mychart message sent to advise the patient to schedule an OV fasting.     KP

## 2013-08-01 ENCOUNTER — Other Ambulatory Visit: Payer: Self-pay

## 2013-08-01 DIAGNOSIS — Z1231 Encounter for screening mammogram for malignant neoplasm of breast: Secondary | ICD-10-CM

## 2013-08-14 ENCOUNTER — Other Ambulatory Visit (INDEPENDENT_AMBULATORY_CARE_PROVIDER_SITE_OTHER): Payer: Medicare Other

## 2013-08-14 DIAGNOSIS — R7309 Other abnormal glucose: Secondary | ICD-10-CM

## 2013-08-14 DIAGNOSIS — E785 Hyperlipidemia, unspecified: Secondary | ICD-10-CM

## 2013-08-14 DIAGNOSIS — T887XXA Unspecified adverse effect of drug or medicament, initial encounter: Secondary | ICD-10-CM

## 2013-08-15 LAB — HEPATIC FUNCTION PANEL
Alkaline Phosphatase: 40 U/L (ref 39–117)
Bilirubin, Direct: 0 mg/dL (ref 0.0–0.3)
Total Bilirubin: 0.4 mg/dL (ref 0.3–1.2)
Total Protein: 6.7 g/dL (ref 6.0–8.3)

## 2013-08-15 LAB — LIPID PANEL
HDL: 56.8 mg/dL (ref >39.00)
LDL Cholesterol: 106 mg/dL — ABNORMAL HIGH (ref 0–99)
Total CHOL/HDL Ratio: 3
Triglycerides: 101 mg/dL (ref 0.0–149.0)
VLDL: 20.2 mg/dL (ref 0.0–40.0)

## 2013-08-15 LAB — HEMOGLOBIN A1C: Hgb A1c MFr Bld: 6 % (ref 4.6–6.5)

## 2013-08-15 NOTE — Addendum Note (Signed)
Addended by: Verdie Shire on: 08/15/2013 09:18 AM   Modules accepted: Orders

## 2013-08-16 ENCOUNTER — Ambulatory Visit: Payer: Medicare Other | Admitting: Internal Medicine

## 2013-08-21 ENCOUNTER — Encounter: Payer: Self-pay | Admitting: Internal Medicine

## 2013-08-21 ENCOUNTER — Ambulatory Visit (INDEPENDENT_AMBULATORY_CARE_PROVIDER_SITE_OTHER): Payer: Medicare Other | Admitting: Internal Medicine

## 2013-08-21 VITALS — BP 106/70 | HR 67 | Temp 97.9°F | Wt 195.4 lb

## 2013-08-21 DIAGNOSIS — J45909 Unspecified asthma, uncomplicated: Secondary | ICD-10-CM

## 2013-08-21 DIAGNOSIS — Z86010 Personal history of colonic polyps: Secondary | ICD-10-CM

## 2013-08-21 DIAGNOSIS — E785 Hyperlipidemia, unspecified: Secondary | ICD-10-CM

## 2013-08-21 DIAGNOSIS — R946 Abnormal results of thyroid function studies: Secondary | ICD-10-CM

## 2013-08-21 DIAGNOSIS — R259 Unspecified abnormal involuntary movements: Secondary | ICD-10-CM

## 2013-08-21 NOTE — Assessment & Plan Note (Signed)
TSH in 12/2013

## 2013-08-21 NOTE — Assessment & Plan Note (Signed)
Surveillance colonoscopy to be scheduled 

## 2013-08-21 NOTE — Assessment & Plan Note (Addendum)
She describes exertional symptoms which are controlled with prophylactic Symbicort. Her asthma would be considered extremely well controlled.  She will avoid the incline & hold the Symbicort for several days to see if this impacts  her tremor. Alternate exercise programs were also discussed

## 2013-08-21 NOTE — Patient Instructions (Signed)
Please schedule followup appointment in 6 months. Get TSH in 12/2013

## 2013-08-21 NOTE — Progress Notes (Signed)
Subjective:    Patient ID: Melissa Lowe, female    DOB: 1941-02-09, 72 y.o.   MRN: [id number]  HPI  She is here to followup her recent labs and for med refills. She also wanted to review her immunization status and she plans a cruise to [place].  Her TSH had been therapeutic at 2.61 in June of this year and was not repeated. A1c is in the nondiabetic range at 6.0. Liver function tests were completely normal. LDL was minimally elevated at 106; but HDL is protective at 56.8.  Her only asthma symptoms are reactive airways with exercise climbing a hill. Prophylactic use of Symbicort prevents this. She is concerned that Symbicort or thyroid medication might be causing or contributing to her tremor.  Dr. Bradd Canary records concerning this were reviewed. He plans ongoing monitor her to rule out Parkinson's which he feels is not likely.    Review of Systems  A modified heart healthy diet is followed; exercise encompasses  30-45 minutes 3  times per week as  walking without symptoms. Specifically denied are  chest pain, palpitations, dyspnea, or claudication except DOE with an incline.  Family history is negative  for premature coronary disease. No advanced cholesterol testing to date . There is medication compliance with the statin. Significant abdominal symptoms, memory deficit, or myalgias denied except for intermittent  LLQcramping. Colonoscopy to be scheduled.      Objective:   Physical Exam  Gen.: Healthy and well-nourished in appearance. Alert, appropriate and cooperative throughout exam.Appears younger than stated age  Head: Normocephalic without obvious abnormalities  Eyes: No corneal or conjunctival inflammation noted. Pupils equal round reactive to light and accommodation. No icterus Nose: External nasal exam reveals no deformity or inflammation. Nasal mucosa are pink and moist. No lesions or exudates noted.  Mouth: Oral mucosa and oropharynx reveal no lesions or exudates.  Teeth in good repair. Neck: No deformities, masses, or tenderness noted.  Thyroid normal. Lungs: Normal respiratory effort; chest expands symmetrically. Lungs are clear to auscultation without rales, wheezes, or increased work of breathing. Heart: Normal rate and rhythm. Normal S1 and S2. No gallop, click, or rub. No murmur. Abdomen: Bowel sounds normal; abdomen soft and nontender. No masses, organomegaly or hernias noted.                                  Musculoskeletal/extremities: Mildly accentuated curvature of upper thoracic spine. No clubbing, cyanosis, edema, or significant extremity  deformity noted.Tone & strength  Normal. Joints normal . Nail health good. Able to lie down & sit up w/o help. Negative SLR bilaterally Vascular: Carotid, radial artery, dorsalis pedis and  posterior tibial pulses are full and equal. No bruits present. Neurologic: Alert and oriented x3. Deep tendon reflexes symmetrical and normal.  Tremor of hands & intermittently of head .        Skin: Intact without suspicious lesions or rashes. Lymph: No cervical, axillary lymphadenopathy present. Psych: Mood and affect are normal. Normally interactive  Assessment & Plan:  See Current Assessment & Plan in Problem List under specific Diagnosis

## 2013-08-21 NOTE — Assessment & Plan Note (Signed)
No Change in meds.

## 2013-08-31 ENCOUNTER — Other Ambulatory Visit: Payer: Self-pay

## 2013-09-11 ENCOUNTER — Ambulatory Visit: Payer: Medicare Other

## 2013-10-03 ENCOUNTER — Ambulatory Visit: Payer: Medicare Other

## 2013-10-04 ENCOUNTER — Other Ambulatory Visit: Payer: Self-pay | Admitting: Internal Medicine

## 2013-10-04 NOTE — Telephone Encounter (Signed)
Pravastatin and Montelukast refilled per protocol

## 2013-10-24 ENCOUNTER — Ambulatory Visit
Admission: RE | Admit: 2013-10-24 | Discharge: 2013-10-24 | Disposition: A | Discharge status: Home / Self Care | Payer: Medicare Other | Source: Ambulatory Visit

## 2013-10-24 DIAGNOSIS — Z1231 Encounter for screening mammogram for malignant neoplasm of breast: Secondary | ICD-10-CM

## 2013-10-31 ENCOUNTER — Ambulatory Visit: Payer: Medicare Other

## 2013-12-15 NOTE — Telephone Encounter (Signed)
Pt came in for her visit closing encounter °

## 2013-12-25 HISTORY — PX: COLONOSCOPY: SHX174

## 2014-01-01 ENCOUNTER — Ambulatory Visit (INDEPENDENT_AMBULATORY_CARE_PROVIDER_SITE_OTHER): Payer: Medicare Other | Admitting: Neurology

## 2014-01-01 ENCOUNTER — Encounter: Payer: Self-pay | Admitting: Neurology

## 2014-01-01 VITALS — BP 141/68 | HR 81 | Ht 67.0 in | Wt 197.0 lb

## 2014-01-01 DIAGNOSIS — R259 Unspecified abnormal involuntary movements: Secondary | ICD-10-CM

## 2014-01-01 DIAGNOSIS — R251 Tremor, unspecified: Secondary | ICD-10-CM

## 2014-01-01 NOTE — Progress Notes (Signed)
Provider:  Dr Janann Colonel Referring Provider: Hendricks Limes, MD Primary Care Physician:  [name], MD  PP:[id number]  HPI:  Melissa Lowe is a 73 y.o. female here as a follow up for her tremor  Overall reports doing well since her last visit, she feels she is unchanged. She continues to have a mild chin tremor, bilateral hand tremor that she notes is rest and postural. Denies any reaction or intention tremor. Feels tremor is stable, perhaps slightly improved compared to prior visit. Notes some worsening with stress/anxiety.  Denies any bradykinesia, no generalized slowing down, no stiffness no gait instability no falls. Continues to be active exercises on a regular basis. It is unclear if alcohol some effect on her tremor. Her tremor is made worse by her asthma medication.  Per Dr Erling Cruz prior note from 09/2012: Onset lip tremor in 2010, progressed, now involves hands L>R, difficulty holding fork or book. No change with EtOH. Asthma medication makes her tremor worse. Tremor now involves chin, voice and hands. Her mother had dementia and a tremor.   Concerns/Questions:Review of Systems: Out of a complete 14 system review, the patient complains of only the following symptoms, and all other reviewed systems are negative. Other for tremor, allergies  History   Social History  . Marital Status: Married    Spouse Name: [name]    Number of Children: 0  . Years of Education: Master's    Occupational History  . Not on file.   Social History Main Topics  . Smoking status: Never Smoker   . Smokeless tobacco: Never Used  . Alcohol Use: Yes     Comment: Wine couple days weekly  . Drug Use: No  . Sexual Activity: Not on file   Other Topics Concern  . Not on file   Social History Narrative   Patient lives at home with her husband [name].    Patient has a Master's Degree and post grad.    Patient has no children.    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    Family  History  Problem Relation Age of Onset  . Dementia Mother   . Asthma Sister   . Breast cancer Sister   . Stroke Maternal Grandmother     in 79s  . Stroke Paternal Grandmother 89  . Hypertension Neg Hx   . Heart disease Neg Hx     Past Medical History  Diagnosis Date  . Asthma   . Hypothyroidism   . Hyperlipidemia     Past Surgical History  Procedure Laterality Date  . Septoplasty    . Cholecystectomy    . Colon polypectomy  2001    Dr Earlean Shawl  . Cataract extraction    . Abdominal hysterectomy      2  fibroids  . Repair fascial defect leg  1972  . Anterior and posterior repair N/A 01/30/2013    Procedure:  POSTERIOR REPAIR (RECTOCELE);  Surgeon: Princess Bruins, MD;  Location: [facility];  Service: Gynecology;  Laterality: N/A;    Current Outpatient Prescriptions  Medication Sig Dispense Refill  . Ascorbic Acid (VITAMIN C) 500 MG tablet Take 500 mg by mouth daily.        Marland Kitchen aspirin 81 MG tablet Take 81 mg by mouth at bedtime.       . budesonide-formoterol (SYMBICORT) 80-4.5 MCG/ACT inhaler INHALE 1 PUFFS INTO THE LUNGS ONCE  A DAY      . Calcium Carbonate-Vitamin D (CALTRATE 600+D) 600-400  MG-UNIT per chew tablet Chew 1 tablet by mouth daily.      . Cholecalciferol (VITAMIN D3) 1000 UNITS CAPS Take by mouth daily.      Marland Kitchen estradiol (MENOSTAR) 14 MCG/24HR Place 1 patch onto the skin once a week. Changes patch every Saturday.      Marland Kitchen FIBER SELECT GUMMIES PO Take by mouth.      . levothyroxine (SYNTHROID, LEVOTHROID) 50 MCG tablet TAKE 1 TABLET BY MOUTH DAILY  90 tablet  3  . montelukast (SINGULAIR) 10 MG tablet TAKE 1 TABLET DAILY  90 tablet  1  . Multiple Vitamin (MULTIVITAMIN) capsule Take 1 capsule by mouth daily.        Marland Kitchen omeprazole (PRILOSEC) 20 MG capsule Take 20 mg by mouth daily.       . pravastatin (PRAVACHOL) 40 MG tablet TAKE 1 TABLET AT BEDTIME  90 tablet  1   No current facility-administered medications for this visit.    Allergies as of 01/01/2014 - Review  Complete 01/01/2014  Allergen Reaction Noted  . Sulfonamide derivatives    . Tetracycline      Vitals: BP 141/68  Pulse 81  Ht 5\' 7"  (1.702 m)  Wt 197 lb (89.359 kg)  BMI 30.85 kg/m2 Last Weight:  Wt Readings from Last 1 Encounters:  01/01/14 197 lb (89.359 kg)   Last Height:   Ht Readings from Last 1 Encounters:  01/01/14 5\' 7"  (1.702 m)     Physical exam: Exam: Gen: NAD, conversant Eyes: anicteric sclerae, moist conjunctivae HENT: Atraumati Lungs: CTA, no wheezing, rales, rhonic                          CV: RRR, no MRG Abdomen: Soft, non-tender;  Extremities: No peripheral edema  Skin: Normal temperature, no rash,  Psych: Appropriate affect, pleasant  Neuro: MS: AA&Ox3, appropriately interactive, normal affect   Speech: fluent w/o paraphasic error  Memory: good recent and remote recall  CN: PERRL, EOMI no nystagmus, no ptosis, sensation intact to LT V1-V3 bilat, face symmetric, no weakness, hearing grossly intact, palate elevates symmetrically, shoulder shrug 5/5 bilat,  tongue protrudes midline, no fasiculations noted.  Motor: normal bulk and tone Strength: 5/5  In all extremities  Reflexes: symmetrical, bilat downgoing toes  Sens: LT intact in all extremities  Brief Motor UPDRS  Speech: tremor noted in speech  Facial Expression:wnl  Tremor: Head tremor noted Rest R1 L1 Action/postural R1 L1 Rigidity: wnl Finger taps:   R:1 L:0  Open/close hands: R:1 L:0  Foot taps: R:0 L:0  Arising from Chair: 0 Gait/FOG: Normal arm swing, steady, negative Pull test    Assessment:  After physical and neurologic examination, review of laboratory studies, imaging, neurophysiology testing and pre-existing records, assessment will be reviewed on the problem list.  Plan:  Treatment plan and additional workup will be reviewed under Problem List.  Melissa Lowe is a pleasant 73y/o woman sent in for followup evaluation of her tremor. Tremor has  been ongoing for around 3-4 years, described as a bilateral hand rest and postural tremor. She also notes a jaw and voice tremor. The symptoms have been stable for the past few years. Denies any other parkinsonian features, such as bradykinesia, stiffness rigidity. Her physical exam is pertinent for a right greater than left rest and postural tremor,.tremor and very mild bradykinesia on bilateral upper extremities right greater than left. She has symptoms and findings consistent with both essential tremor and parkinsonism, at  this time feel she is still more likely an essential tremor. As her symptoms are mild, there is no indication for medical therapy at this time. We will continue to monitor.  1)Tremor  -continue to monitor at this time -hold off on medical therapy -counseled patient on importance of good exercise and nutrition -follow up in 6 months or prn. If stable at that time will transition to yearly follow up

## 2014-01-06 ENCOUNTER — Encounter: Payer: Self-pay | Admitting: Internal Medicine

## 2014-01-09 ENCOUNTER — Other Ambulatory Visit (INDEPENDENT_AMBULATORY_CARE_PROVIDER_SITE_OTHER): Payer: 59

## 2014-01-09 DIAGNOSIS — R946 Abnormal results of thyroid function studies: Secondary | ICD-10-CM

## 2014-01-09 LAB — TSH: TSH: 2.37 u[IU]/mL (ref 0.35–5.50)

## 2014-01-12 ENCOUNTER — Ambulatory Visit (INDEPENDENT_AMBULATORY_CARE_PROVIDER_SITE_OTHER): Payer: 59 | Admitting: Internal Medicine

## 2014-01-12 ENCOUNTER — Encounter: Payer: Self-pay | Admitting: Internal Medicine

## 2014-01-12 VITALS — BP 130/80 | HR 95 | Temp 98.6°F | Resp 14 | Wt 196.0 lb

## 2014-01-12 DIAGNOSIS — J209 Acute bronchitis, unspecified: Secondary | ICD-10-CM

## 2014-01-12 MED ORDER — HYDROCODONE-HOMATROPINE 5-1.5 MG/5ML PO SYRP: 5.0000 mL | ORAL_SOLUTION | Freq: Four times a day (QID) | ORAL | Status: DC | PRN

## 2014-01-12 MED ORDER — AZITHROMYCIN 250 MG PO TABS: ORAL_TABLET | ORAL | Status: DC

## 2014-01-12 NOTE — Progress Notes (Signed)
Pre visit review using our clinic review tool, if applicable. No additional management support is needed unless otherwise documented below in the visit note. 

## 2014-01-12 NOTE — Progress Notes (Signed)
   Subjective:    Patient ID: Melissa Lowe, female    DOB: 1941-04-20, 73 y.o.   MRN: [id number]  HPI Symptoms began last night as chest congestion and frontal headache.  She describes thick chest sputum, but is unsure of color as she does not look at it.  She had a fever this morning of 100; took ASA for pain.  Slight rhinitis, clear nasal secretions.  Significantly, her husband recently traveled and today was treated with Z-Pack and Mucinex for same symptoms.  Hx significant for asthma, well-controlled with Singulair and Symbicort. She did require an extra puff of Symbicort last night.   Review of Systems Denies maxillary or dental pain; denies itchy, watery eyes, discolored nasal secretions, myalgias.     Objective:   Physical Exam General appearance:good health ;well nourished; no acute distress or increased work of breathing is present. No lymphadenopathy about the head, neck, or axilla noted.  Mild head tremor.  Eyes: No conjunctival inflammation or lid edema is present. There is no scleral icterus.  Ears: External ear exam shows no significant lesions or deformities. Otoscopic examination reveals clear canals, minimal erythrema noted on R TM.  Tympanic membranes are intact bilaterally without bulging, retraction, inflammation or discharge.  Nose: External nasal examination shows no deformity or inflammation. Nasal mucosa are pink and moist without lesions or exudates. No septal dislocation or deviation.No obstruction to airflow.  Oral exam: Dental hygiene is good; lips and gums are healthy appearing.There is no oropharyngeal erythema or exudate noted.  Neck: No deformities, thyromegaly, masses, or tenderness noted. Supple with full range of motion without pain.  Heart: Normal rate and regular rhythm. S1 and S2 normal without gallop, murmur, click, rub or other extra sounds.  Lungs:minimal low grade wheezing present; mild rhonchi bilaterally present.No increased work of breathing.   Extremities: No cyanosis, edema, or clubbing noted  Skin: Warm & dry w/o jaundice or tenting.     Assessment & Plan:  #1 bronchitis with slight bronchospasm  Zpack/ qhs cough med if needed/nasal cleansing/continue Symbicort 2 puffs bid

## 2014-01-12 NOTE — Progress Notes (Signed)
   Subjective:    Patient ID: Melissa Lowe, female    DOB: Feb 10, 1941, 73 y.o.   MRN: [id number]  HPI Symptoms began last night as chest congestion and frontal headache.  She describes thick chest sputum, but is unsure of color as she does not look at it.  She had a fever this morning of 100; took ASA for pain.  Slight rhinitis, clear nasal secretions.   Significantly, her husband recently traveled and today was treated with Z-Pack and Mucinex for same symptoms.   Hx significant for asthma, well-controlled with Singulair and Symbicort.    Review of Systems Denies maxillary or dental pain; denies itchy, watery eyes, discolored nasal secretions, myalgias.      Objective:   Physical Exam General appearance:good health ;well nourished; no acute distress or increased work of breathing is present.  No  lymphadenopathy about the head, neck, or axilla noted.  Mild head tremor.   Eyes: No conjunctival inflammation or lid edema is present. There is no scleral icterus. Ears:  External ear exam shows no significant lesions or deformities.  Otoscopic examination reveals clear canals, mild discoloration noted on R TM. Tympanic membranes are intact bilaterally without bulging, retraction, inflammation or discharge. Nose:  External nasal examination shows no deformity or inflammation. Nasal mucosa are pink and moist without lesions or exudates. No septal dislocation or deviation.No obstruction to airflow.  Oral exam: Dental hygiene is good; lips and gums are healthy appearing.There is no oropharyngeal erythema or exudate noted.  Neck:  No deformities, thyromegaly, masses, or tenderness noted.   Supple with full range of motion without pain.  Heart:  Normal rate and regular rhythm. S1 and S2 normal without gallop, murmur, click, rub or other extra sounds.  Lungs:Chest clear to auscultation; Inspiratory wheeze present; mild rhonchi bilaterally present.No increased work of breathing.   Extremities:   No cyanosis, edema, or clubbing  noted  Skin: Warm & dry w/o jaundice or tenting.     Assessment & Plan:  #1 bronchitis with bronchospasm Zpack/tessilon pearls/nasal cleansing

## 2014-01-12 NOTE — Patient Instructions (Addendum)
Plain Mucinex (NOT D) for thick secretions ;force NON dairy fluids .   Nasal cleansing in the shower as discussed with lather of mild shampoo.After 10 seconds wash off lather while  exhaling through nostrils. Make sure that all residual soap is removed to prevent irritation.  Flonase OR Nasacort AQ 1 spray in each nostril twice a day as needed. Use the "crossover" technique into opposite nostril spraying toward opposite ear @ 45 degree angle, not straight up into nostril.  Use a Neti pot daily only  as needed for significant sinus congestion; going from open side to congested side . Plain Allegra (NOT D )  160 daily , Loratidine 10 mg , OR Zyrtec 10 mg @ bedtime  as needed for itchy eyes & sneezing. Update of PNA vaccine discussed; she'll consider.

## 2014-01-30 ENCOUNTER — Encounter: Payer: Self-pay | Admitting: Internal Medicine

## 2014-01-30 NOTE — Telephone Encounter (Signed)
Closing encounter

## 2014-02-20 ENCOUNTER — Ambulatory Visit: Payer: Medicare Other | Admitting: Internal Medicine

## 2014-03-02 ENCOUNTER — Encounter: Payer: Self-pay | Admitting: Internal Medicine

## 2014-03-02 ENCOUNTER — Ambulatory Visit (INDEPENDENT_AMBULATORY_CARE_PROVIDER_SITE_OTHER): Payer: 59 | Admitting: Internal Medicine

## 2014-03-02 VITALS — BP 130/76 | HR 72 | Temp 98.0°F | Resp 13 | Wt 200.0 lb

## 2014-03-02 DIAGNOSIS — J45909 Unspecified asthma, uncomplicated: Secondary | ICD-10-CM

## 2014-03-02 DIAGNOSIS — R259 Unspecified abnormal involuntary movements: Secondary | ICD-10-CM

## 2014-03-02 DIAGNOSIS — E785 Hyperlipidemia, unspecified: Secondary | ICD-10-CM

## 2014-03-02 DIAGNOSIS — E039 Hypothyroidism, unspecified: Secondary | ICD-10-CM

## 2014-03-02 MED ORDER — MONTELUKAST SODIUM 10 MG PO TABS: ORAL_TABLET | ORAL | Status: DC

## 2014-03-02 MED ORDER — BUDESONIDE-FORMOTEROL FUMARATE 80-4.5 MCG/ACT IN AERO: INHALATION_SPRAY | RESPIRATORY_TRACT | Status: DC

## 2014-03-02 NOTE — Assessment & Plan Note (Signed)
NMR Lipoprofile, LFT 

## 2014-03-02 NOTE — Progress Notes (Signed)
Pre visit review using our clinic review tool, if applicable. No additional management support is needed unless otherwise documented below in the visit note. 

## 2014-03-02 NOTE — Progress Notes (Signed)
   Subjective:    Patient ID: Melissa Lowe, female    DOB: [birth year], 73 y.o.   MRN: [id number]  HPI A heart healthy diet is followed; no regular  exercise  Family history is negative for premature coronary disease. To date no advanced cholesterol testing done. There is medication compliance with the statin.  Low dose ASA taken   Review of Systems Specifically denied are  chest pain, palpitations, dyspnea, or claudication.  Significant abdominal symptoms, memory deficit, or myalgias not present.  Her asthma is well-controlled with her present regimen.  Her TSH is therapeutic.  Her gynecologist performed bone densities; these have been normal  She is followed by Dr. Janann Colonel, neurologist for tremor of the hands. No definite movement disorder diagnosis has been made at this time.       Objective:   Physical Exam  Significant or distinguishing  findings on physical exam include: Unsustained nystagmus with vertical gaze. This is also associated with brief tremor of the head. She has some fine tremor of both hands as well. Gen.: Healthy and well-nourished in appearance. Alert, appropriate and cooperative throughout exam. Appears younger than stated age  Head: Normocephalic without obvious abnormalities  Eyes: No corneal or conjunctival inflammation noted. Pupils equal round reactive to light and accommodation. Extraocular motion intact. No lid lag or proptosis. Nose: External nasal exam reveals no deformity or inflammation. Nasal mucosa are pink and moist. No lesions or exudates noted.   Mouth: Oral mucosa and oropharynx reveal no lesions or exudates. Teeth in good repair. Neck: No deformities, masses, or tenderness noted. Range of motion &. Thyroid normal Lungs: Normal respiratory effort; chest expands symmetrically. Lungs are clear to auscultation without rales, wheezes, or increased work of breathing. Heart: Normal rate and rhythm. Normal S1 and S2. No gallop, click, or rub. No  murmur. Abdomen: Bowel sounds normal; abdomen soft and nontender. No masses, organomegaly or hernias noted.                           Musculoskeletal/extremities: No deformity or scoliosis noted of  the thoracic or lumbar spine.  No clubbing, cyanosis, edema, or significant extremity  deformity noted. Range of motion normal .Tone & strength normal. Hand joints normal.  Fingernail / toenail health good. Able to lie down & sit up w/o help.  Vascular: Carotid, radial artery, dorsalis pedis and  posterior tibial pulses are full and equal. No bruits present. Neurologic: Alert and oriented x3. Deep tendon reflexes symmetrical and normal.  Gait normal . Skin: Intact without suspicious lesions or rashes. Lymph: No cervical, axillary lymphadenopathy present. Psych: Mood and affect are normal. Normally interactive                                                                                        Assessment & Plan:  See Current Assessment & Plan in Problem List under specific Diagnosis

## 2014-03-02 NOTE — Patient Instructions (Addendum)
Your next office appointment will be determined based upon review of your pending labs. Those instructions will be transmitted to you through My Chart . 

## 2014-03-02 NOTE — Assessment & Plan Note (Signed)
   She will discuss the vertical nystagmus and head tremor with Dr. Janann Colonel at her next visit.  Avoidance of stimulants was discussed.

## 2014-03-02 NOTE — Assessment & Plan Note (Signed)
Stable; meds renewed

## 2014-03-08 ENCOUNTER — Other Ambulatory Visit: Payer: Self-pay | Admitting: Internal Medicine

## 2014-05-16 ENCOUNTER — Ambulatory Visit (INDEPENDENT_AMBULATORY_CARE_PROVIDER_SITE_OTHER): Payer: 59 | Admitting: Internal Medicine

## 2014-05-16 ENCOUNTER — Encounter: Payer: Self-pay | Admitting: Internal Medicine

## 2014-05-16 VITALS — BP 138/80 | HR 89 | Temp 99.3°F | Wt 200.6 lb

## 2014-05-16 DIAGNOSIS — J069 Acute upper respiratory infection, unspecified: Secondary | ICD-10-CM

## 2014-05-16 DIAGNOSIS — J209 Acute bronchitis, unspecified: Secondary | ICD-10-CM

## 2014-05-16 MED ORDER — AMOXICILLIN 500 MG PO CAPS: 500.0000 mg | ORAL_CAPSULE | Freq: Three times a day (TID) | ORAL | Status: DC

## 2014-05-16 MED ORDER — PREDNISONE 20 MG PO TABS: 20.0000 mg | ORAL_TABLET | Freq: Two times a day (BID) | ORAL | Status: DC

## 2014-05-16 NOTE — Progress Notes (Signed)
   Subjective:    Patient ID: Melissa Lowe, female    DOB: 1940/12/10, 73 y.o.   MRN: [id number]  HPI Pt symptoms started 05/12/14. Her sx began as chest tightness. The following day she developed a sore throat, cough, rhinorrhea of clear secretions, frontal headache and myalgias. Today all of these symptoms are present and have worsened. The cough is mostly nonproductive though she feels there is sputum being produced but she is unable to bring it up.   Pt has history of asthma. She is having some wheezing. She has increased her symbicort from q day to BID. She has also tried Mucinex and alka seltzer to little relief. Pt is unsure of her last Pneumovax.  Her husband was recently sick with URI.   Review of Systems  Constitutional: Negative for fever and chills.  HENT: Negative for congestion, ear discharge, ear pain, postnasal drip and sneezing.   Eyes: Negative for itching.  Respiratory: Negative for shortness of breath.       Objective:   Physical Exam Low grade fever Froggy voice  Tender anterior and posterior cervical lymph nodes Tenderness over maxillary sinuses Essential tremor  Wheezes and scattered rhonchi in bilateral lung fields      Assessment & Plan:  #1 sinusitis vs acute bronchitis; give amoxicllin or augmentin vs azithromycin  #2 cough; consider CXR

## 2014-05-16 NOTE — Progress Notes (Signed)
Pre visit review using our clinic review tool, if applicable. No additional management support is needed unless otherwise documented below in the visit note. 

## 2014-05-16 NOTE — Patient Instructions (Signed)

## 2014-05-16 NOTE — Progress Notes (Signed)
   Subjective:    Patient ID: Melissa Lowe, female    DOB: 1941-04-16, 73 y.o.   MRN: [id number]  HPI   Symptoms began 05/12/14 as chest tightness. The next day she developed sore throat, cough, and rhinitis with clear secretions. She's had associated frontal headache and myalgias.  Today all symptoms persist and have progressed in severity.  The cough is nonproductive although she feels there is sputum in her chest which she cannot expectorate  She increased the Symbicort from one puff daily to one twice a day.It is a  low-dose Symbicort. She also tried Mucinex and Alka-Seltzer without benefit.  Her husband was recently ill with upper respiratory infection.    Review of Systems  No fever and chills.  She has no otic pain or otic discharge.  She also denies shortness of breath with her wheezing.     Objective:   Physical Exam   She is markedly hoarse but also has some hyponasal speech quality to her voice  She has an S4  without significant murmur or gallop  She has diffuse low-grade expiratory wheezing  There is a fine tremor of the head and hands which is subtle.  General appearance:good health ;well nourished; no acute distress or increased work of breathing is present.  No  lymphadenopathy about the head, neck, or axilla noted.   Eyes: No conjunctival inflammation or lid edema is present. There is no scleral icterus.  Ears:  External ear exam shows no significant lesions or deformities.  Otoscopic examination reveals clear canals, tympanic membranes are intact bilaterally without bulging, retraction, inflammation or discharge.  Nose:  External nasal examination shows no deformity or inflammation. Nasal mucosa are dry without lesions or exudates. No septal dislocation or deviation.No obstruction to airflow.   Oral exam: Dental hygiene is good; lips and gums are healthy appearing.There is no oropharyngeal erythema or exudate noted.   Neck:  No deformities,  thyromegaly, masses, or tenderness noted.   Supple with full range of motion without pain.   Heart:  Normal rate and regular rhythm. S1 and S2 normal   Lungs:No increased work of breathing.    Extremities:  No cyanosis, edema, or clubbing  noted    Skin: Warm & dry w/o jaundice or tenting.         Assessment & Plan:  #1 asthmatic bronchitis  #2 acute upper respiratory tract infection, rule out sinusitis  Plan: She'll increase Symbicort  To 2 puffs twice a day. See additional orders for ampicillin and a short course of prednisone.

## 2014-06-04 ENCOUNTER — Encounter: Payer: Self-pay | Admitting: Internal Medicine

## 2014-06-04 ENCOUNTER — Other Ambulatory Visit: Payer: 59

## 2014-06-04 ENCOUNTER — Ambulatory Visit (INDEPENDENT_AMBULATORY_CARE_PROVIDER_SITE_OTHER): Payer: 59 | Admitting: Internal Medicine

## 2014-06-04 VITALS — BP 126/70 | HR 84 | Temp 98.3°F | Wt 201.0 lb

## 2014-06-04 DIAGNOSIS — R82998 Other abnormal findings in urine: Secondary | ICD-10-CM

## 2014-06-04 DIAGNOSIS — R300 Dysuria: Secondary | ICD-10-CM

## 2014-06-04 DIAGNOSIS — R8290 Unspecified abnormal findings in urine: Secondary | ICD-10-CM

## 2014-06-04 LAB — POCT URINALYSIS DIPSTICK
BILIRUBIN UA: NEGATIVE
Glucose, UA: NEGATIVE
KETONES UA: NEGATIVE
Nitrite, UA: POSITIVE
PH UA: 6
SPEC GRAV UA: 1.01
Urobilinogen, UA: 0.2

## 2014-06-04 MED ORDER — NITROFURANTOIN MONOHYD MACRO 100 MG PO CAPS: 100.0000 mg | ORAL_CAPSULE | Freq: Two times a day (BID) | ORAL | Status: DC

## 2014-06-04 NOTE — Telephone Encounter (Signed)
Error

## 2014-06-04 NOTE — Progress Notes (Signed)
Pre visit review using our clinic review tool, if applicable. No additional management support is needed unless otherwise documented below in the visit note. 

## 2014-06-04 NOTE — Patient Instructions (Signed)
Drink as much nondairy fluids as possible. Avoid spicy foods or alcohol as  these may aggravate the bladder. Do not take decongestants. Avoid narcotics if possible. 

## 2014-06-05 NOTE — Progress Notes (Signed)
   Subjective:    Patient ID: Melissa Lowe, female    DOB: 26-Sep-1941, 73 y.o.   MRN: [id number]  HPI Symptoms began 06/02/14 as burning with urination. She also had bilateral flank pain.  She began over-the-counter preparation called  Uristat UTI Relief  which decreased dysuria and reduced the pain.  Significantly she recently completed antibiotic for infectious bronchitis associated with asthma flare.  She has no history of recurrent urinary tract infection; renal calculi; genitourinary abnormalities; or cystoscopy. She's not a diabetic    Review of Systems  She denies hesitancy, urgency, frequency, hematuria, by area, fever, chills, sweats, or nocturia.        Objective:   Physical Exam General appearance is one of good health and nourishment w/o distress.  Eyes: No conjunctival inflammation or scleral icterus is present.  Heart:  Normal rate and regular rhythm. S1 and S2 normal without gallop, murmur, click, rub or other extra sounds     Lungs:Chest clear to auscultation; no wheezes, rhonchi,rales ,or rubs present.No increased work of breathing.   Abdomen: bowel sounds normal, soft and non-tender without masses, organomegaly or hernias noted.  No guarding or rebound . No tenderness over the flanks to percussion  Musculoskeletal: Able to lie flat and sit up without help. Guards R knee (active meniscal issues; seeing Orthopedist). Gait normal  Skin:Warm & dry.  Intact without suspicious lesions or rashes ; no jaundice or tenting  Lymphatic: No lymphadenopathy is noted about the head, neck, axilla.                Assessment & Plan:  #1 dysuria #2 flank pain See orders & AVS

## 2014-06-06 LAB — URINE CULTURE: Colony Count: >=100000

## 2014-06-08 ENCOUNTER — Telehealth: Payer: Self-pay | Admitting: Internal Medicine

## 2014-06-08 ENCOUNTER — Other Ambulatory Visit: Payer: Self-pay | Admitting: Internal Medicine

## 2014-06-08 MED ORDER — BUDESONIDE-FORMOTEROL FUMARATE 80-4.5 MCG/ACT IN AERO: INHALATION_SPRAY | RESPIRATORY_TRACT | Status: DC

## 2014-06-08 NOTE — Telephone Encounter (Signed)
Pt request refill for pravastatin, symbicort and montelukast to be send to [facility]. Pt is almost out of these med. Please call pt

## 2014-06-08 NOTE — Telephone Encounter (Signed)
Pravastatin & singulair already sent will send symbicort...Melissa Lowe

## 2014-07-04 ENCOUNTER — Ambulatory Visit (INDEPENDENT_AMBULATORY_CARE_PROVIDER_SITE_OTHER): Payer: Medicare Other | Admitting: Neurology

## 2014-07-04 ENCOUNTER — Encounter: Payer: Self-pay | Admitting: Neurology

## 2014-07-04 VITALS — BP 103/72 | HR 66 | Ht 67.0 in | Wt 201.0 lb

## 2014-07-04 DIAGNOSIS — R251 Tremor, unspecified: Secondary | ICD-10-CM

## 2014-07-04 DIAGNOSIS — R259 Unspecified abnormal involuntary movements: Secondary | ICD-10-CM

## 2014-07-04 NOTE — Patient Instructions (Signed)
Overall you are doing fairly well but I do want to suggest a few things today:   Remember to drink plenty of fluid, eat healthy meals and do not skip any meals. Try to eat protein with a every meal and eat a healthy snack such as fruit or nuts in between meals. Try to keep a regular sleep-wake schedule and try to exercise daily, particularly in the form of walking, 20-30 minutes a day, if you can.   Please follow up in 6 months. I suggest you follow up with Dr Star Age, Dr Floyde Parkins or Dr Georgia Dom.   My clinical assistant and will answer any of your questions and relay your messages to me and also relay most of my messages to you.   Our phone number is [phone]. We also have an after hours call service for urgent matters and there is a physician on-call for urgent questions. For any emergencies you know to call 911 or go to the nearest emergency room

## 2014-07-04 NOTE — Progress Notes (Signed)
Provider:  Dr Janann Colonel Referring Provider: Hendricks Limes, MD Primary Care Physician:  Melissa Cobble, MD  WC:[id number]  HPI:  Melissa Lowe is a 73 y.o. female here as a follow up for her tremor. Feels she is doing well overall. No change from prior visit. Continues to have an intermittent tremor in bilateral hands, remains predominantly a postural tremor, there is a mild rest component. Tremor is more pronounced with fatigue. No change with EtOH. No difficulty with walking, no generalized slowing down of her movements.   Had right meniscus injury in April  Overall reports doing well since her last visit, she feels she is unchanged. She continues to have a mild chin tremor, bilateral hand tremor that she notes is rest and postural. Denies any reaction or intention tremor. Feels tremor is stable, perhaps slightly improved compared to prior visit. Notes some worsening with stress/anxiety.  Denies any bradykinesia, no generalized slowing down, no stiffness no gait instability no falls. Continues to be active exercises on a regular basis. It is unclear if alcohol some effect on her tremor. Her tremor is made worse by her asthma medication.  Per Dr Melissa Lowe prior note from 09/2012: Onset lip tremor in 2010, progressed, now involves hands L>R, difficulty holding fork or book. No change with EtOH. Asthma medication makes her tremor worse. Tremor now involves chin, voice and hands. Her mother had dementia and a tremor.   Concerns/Questions:Review of Systems: Out of a complete 14 system review, the patient complains of only the following symptoms, and all other reviewed systems are negative. Other for tremor, allergies  History   Social History  . Marital Status: Married    Spouse Name: Melissa Lowe    Number of Children: 0  . Years of Education: Master's    Occupational History  . Not on file.   Social History Main Topics  . Smoking status: Never Smoker   . Smokeless tobacco: Never Used  . Alcohol  Use: Yes     Comment: Wine couple days weekly  . Drug Use: No  . Sexual Activity: Not on file   Other Topics Concern  . Not on file   Social History Narrative   Patient lives at home with her husband Melissa Lowe.    Patient has a Master's Degree and post grad.    Patient has no children.    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    Family History  Problem Relation Age of Onset  . Dementia Mother   . Asthma Sister   . Breast cancer Sister   . Stroke Maternal Grandmother     in 85s  . Stroke Paternal Grandmother 89  . Hypertension Neg Hx   . Heart disease Neg Hx     Past Medical History  Diagnosis Date  . Asthma   . Hypothyroidism   . Hyperlipidemia     Past Surgical History  Procedure Laterality Date  . Septoplasty    . Cholecystectomy    . Colon polypectomy  2001    Dr Melissa Lowe  . Cataract extraction    . Abdominal hysterectomy      2  fibroids  . Repair fascial defect leg  1972  . Anterior and posterior repair N/A 01/30/2013    Procedure:  POSTERIOR REPAIR (RECTOCELE);  Surgeon: Melissa Bruins, MD;  Location: [facility];  Service: Gynecology;  Laterality: N/A;  . Colonoscopy  12/25/13    Tics , Dr Melissa Lowe 2020    Current Outpatient Prescriptions  Medication Sig Dispense Refill  . Ascorbic Acid (VITAMIN C) 500 MG tablet Take 500 mg by mouth daily.        . budesonide-formoterol (SYMBICORT) 80-4.5 MCG/ACT inhaler INHALE 1 PUFFS INTO THE LUNGS ONCE  A DAY  3 Inhaler  1  . Calcium Carbonate-Vitamin D (CALTRATE 600+D) 600-400 MG-UNIT per chew tablet Chew 1 tablet by mouth daily.      . Cholecalciferol (VITAMIN D3) 1000 UNITS CAPS Take by mouth daily.      Melissa Lowe estradiol (MENOSTAR) 14 MCG/24HR Place 1 patch onto the skin once a week. Changes patch every Saturday.      . levothyroxine (SYNTHROID, LEVOTHROID) 50 MCG tablet TAKE 1 TABLET BY MOUTH DAILY  90 tablet  3  . montelukast (SINGULAIR) 10 MG tablet TAKE 1 TABLET DAILY  90 tablet  3  . Multiple Vitamin  (MULTIVITAMIN) capsule Take 1 capsule by mouth daily.        Melissa Lowe omeprazole (PRILOSEC) 20 MG capsule Take 20 mg by mouth daily.       . pravastatin (PRAVACHOL) 40 MG tablet TAKE 1 TABLET AT BEDTIME  90 tablet  1  . UNABLE TO FIND Fiber- Vita Fusion Gummies take by mouth       No current facility-administered medications for this visit.    Allergies as of 07/04/2014 - Review Complete 07/04/2014  Allergen Reaction Noted  . Sulfonamide derivatives    . Tetracycline      Vitals: BP 103/72  Pulse 66  Ht 5\' 7"  (1.702 m)  Wt 201 lb (91.173 kg)  BMI 31.47 kg/m2 Last Weight:  Wt Readings from Last 1 Encounters:  07/04/14 201 lb (91.173 kg)   Last Height:   Ht Readings from Last 1 Encounters:  07/04/14 5\' 7"  (1.702 m)     Physical exam: Exam: Gen: NAD, conversant Eyes: anicteric sclerae, moist conjunctivae HENT: Atraumati Lungs: CTA, no wheezing, rales, rhonic                          CV: RRR, no MRG Abdomen: Soft, non-tender;  Extremities: No peripheral edema  Skin: Normal temperature, no rash,  Psych: Appropriate affect, pleasant  Neuro: MS: AA&Ox3, appropriately interactive, normal affect   Speech: fluent w/o paraphasic error  Memory: good recent and remote recall  CN: PERRL, EOMI no nystagmus, no ptosis, sensation intact to LT V1-V3 bilat, face symmetric, no weakness, hearing grossly intact, palate elevates symmetrically, shoulder shrug 5/5 bilat,  tongue protrudes midline, no fasiculations noted.  Motor: normal bulk and tone Strength: 5/5  In all extremities  Reflexes: symmetrical, bilat downgoing toes  Sens: LT intact in all extremities  Brief Motor UPDRS  Speech: tremor noted in speech  Facial Expression:wnl  Tremor: Head tremor noted Rest R1 L1 Action/postural R1 L1 Rigidity: wnl Finger taps:   R:1 L:0  Open/close hands: R:1 L:0  Foot taps: R:0 L:0  Arising from Chair: 0 Gait/FOG: Normal arm swing, steady, negative Pull  test    Assessment:  After physical and neurologic examination, review of laboratory studies, imaging, neurophysiology testing and pre-existing records, assessment will be reviewed on the problem list.  Plan:  Treatment plan and additional workup will be reviewed under Problem List.  Melissa Lowe is a pleasant 73y/o woman sent in for followup evaluation of her tremor. Tremor has been ongoing for around 3-4 years, described as a bilateral hand rest and postural tremor. She also notes a jaw and voice tremor. The symptoms  have been stable for the past few years. Denies any other parkinsonian features, such as bradykinesia, stiffness rigidity. Her physical exam is pertinent for a right greater than left rest and postural tremor,.tremor and very mild bradykinesia on bilateral upper extremities right greater than left. She has symptoms and findings consistent with both essential tremor and parkinsonism, at this time feel she is still more likely an essential tremor. As her symptoms are mild, there is no indication for medical therapy at this time. We will continue to monitor.  1)Tremor  -continue to monitor at this time -hold off on medical therapy -counseled patient on importance of good exercise and nutrition -follow up in 6 months or prn. If stable at that time will transition to yearly follow up

## 2014-09-08 ENCOUNTER — Other Ambulatory Visit: Payer: Self-pay | Admitting: Internal Medicine

## 2014-09-25 ENCOUNTER — Other Ambulatory Visit (INDEPENDENT_AMBULATORY_CARE_PROVIDER_SITE_OTHER): Payer: 59

## 2014-09-25 ENCOUNTER — Other Ambulatory Visit: Payer: Self-pay | Admitting: Internal Medicine

## 2014-09-25 DIAGNOSIS — R946 Abnormal results of thyroid function studies: Secondary | ICD-10-CM

## 2014-09-25 LAB — TSH: TSH: 3.43 u[IU]/mL (ref 0.35–4.50)

## 2014-09-27 ENCOUNTER — Other Ambulatory Visit: Payer: Self-pay

## 2014-09-27 DIAGNOSIS — Z1231 Encounter for screening mammogram for malignant neoplasm of breast: Secondary | ICD-10-CM

## 2014-10-02 ENCOUNTER — Ambulatory Visit: Payer: 59 | Admitting: Internal Medicine

## 2014-10-09 ENCOUNTER — Encounter: Payer: Self-pay | Admitting: Internal Medicine

## 2014-10-09 ENCOUNTER — Ambulatory Visit (INDEPENDENT_AMBULATORY_CARE_PROVIDER_SITE_OTHER): Payer: 59 | Admitting: Internal Medicine

## 2014-10-09 VITALS — BP 100/70 | HR 77 | Temp 97.7°F | Wt 199.2 lb

## 2014-10-09 DIAGNOSIS — Z23 Encounter for immunization: Secondary | ICD-10-CM

## 2014-10-09 DIAGNOSIS — E785 Hyperlipidemia, unspecified: Secondary | ICD-10-CM

## 2014-10-09 DIAGNOSIS — J45909 Unspecified asthma, uncomplicated: Secondary | ICD-10-CM

## 2014-10-09 DIAGNOSIS — R946 Abnormal results of thyroid function studies: Secondary | ICD-10-CM

## 2014-10-09 MED ORDER — MONTELUKAST SODIUM 10 MG PO TABS: ORAL_TABLET | ORAL | Status: DC

## 2014-10-09 MED ORDER — BUDESONIDE-FORMOTEROL FUMARATE 80-4.5 MCG/ACT IN AERO: INHALATION_SPRAY | RESPIRATORY_TRACT | Status: DC

## 2014-10-09 MED ORDER — ALBUTEROL SULFATE HFA 108 (90 BASE) MCG/ACT IN AERS: 1.0000 | INHALATION_SPRAY | RESPIRATORY_TRACT | Status: DC | PRN

## 2014-10-09 MED ORDER — PRAVASTATIN SODIUM 40 MG PO TABS: 40.0000 mg | ORAL_TABLET | Freq: Every day | ORAL | Status: DC

## 2014-10-09 MED ORDER — LEVOTHYROXINE SODIUM 50 MCG PO TABS: 50.0000 ug | ORAL_TABLET | Freq: Every day | ORAL | Status: DC

## 2014-10-09 NOTE — Progress Notes (Signed)
   Subjective:    Patient ID: Melissa Lowe, female    DOB: January 01, 1941, 73 y.o.   MRN: [id number]  HPI  She is here to assess active health issues & conditions. PMH, FH, & Social history verified & updated.  Her TSH was 3.43 on 09/25/14 which is therapeutic on 50 g daily.  She was to have advanced lipid  tests and liver function test  But these were not done.  She is on a heart healthy diet. She is unable to exercise because she tore her meniscus in the right knee. Subsequently she's had a 6 pound weight gain.  Although she's not exercising she has no active cardiac symptoms.  Her last advanced cholesterol testing was in 2005. At that time LDL was 188 with a total particle #2053, small dense particle #861. HDL was reduced at 39.8. Triglycerides were normal 145. Her LDL goal is less than 130, ideally less than 100.  There is no family history heart attack. Both grandmothers had strokes over the 18.  Her asthma has been essentially quiescent until she develped a "cold" recently.. She uses her rescue inhaler less than 2 times a year. Symbicort is used only once a day as 1 puff.She denies any paroxysmal nocturnal dyspnea.  She is on estradiol patch from her gynecologist. Bone density is monitored by gynecologist and is normal.  She takes a Prilosec daily; she has no significant GI symptoms at this time. She's never had an upper endoscopy. Colonoscopy is up-to-date.    Review of Systems  Significant headaches, epistaxis, chest pain, palpitations, exertional dyspnea, claudication, paroxysmal nocturnal dyspnea, or edema absent. No GI symptoms , memory loss or myalgias  Unexplained weight loss, abdominal pain, significant dyspepsia, dysphagia, melena, rectal bleeding, or persistently small caliber stools are denied.     Objective:   Physical Exam  Positive or pertinent findings include: She does have a resting head tremor. She also has fine tremors right upper extremity There is  very minimal light hirsutism of the upper lip. She has scattered very low-grade wheezing in the left lower lobe.  There is minor crepitus of the knees. The right knee reflex was not checked because of her active issues.  General appearance :adequately nourished; in no distress. Eyes: No conjunctival inflammation or scleral icterus is present. Oral exam: Dental hygiene is good. Lips and gums are healthy appearing.There is no oropharyngeal erythema or exudate noted.  Heart:  Normal rate and regular rhythm. S1 and S2 normal without gallop, murmur, click, rub or other extra sounds   Lungs:No increased work of breathing.  Abdomen: bowel sounds normal, soft and non-tender without masses, organomegaly or hernias noted.  No guarding or rebound.  Vascular : all pulses equal ; no bruits present. Skin:Warm & dry.  Intact without suspicious lesions or rashes ; no jaundice or tenting Lymphatic: No lymphadenopathy is noted about the head, neck, axilla           Assessment & Plan:  See Current Assessment & Plan in Problem List under specific Diagnosis

## 2014-10-09 NOTE — Patient Instructions (Signed)
Your next office appointment will be determined based upon review of your pending labs. Those instructions will be transmitted to you through My Chart . 

## 2014-10-09 NOTE — Progress Notes (Signed)
Pre visit review using our clinic review tool, if applicable. No additional management support is needed unless otherwise documented below in the visit note. 

## 2014-10-09 NOTE — Assessment & Plan Note (Signed)
No change in dose; TSH therapeutic

## 2014-10-09 NOTE — Assessment & Plan Note (Signed)
NMR Lipids, LFTs

## 2014-10-09 NOTE — Assessment & Plan Note (Addendum)
No change in meds long term , but Symbicort recommended 1-2 puffs every 12 hrs prn until wheezing from viral process resolves.

## 2014-10-17 ENCOUNTER — Telehealth: Payer: Self-pay | Admitting: *Deleted

## 2014-10-17 NOTE — Telephone Encounter (Signed)
[place] Night - Client TELEPHONE ADVICE RECORD [facility] Patient Name: Melissa Lowe Gender: Female DOB: 27-May-1941 Age: 73 Y 11 M 23 D Return Phone Number: [phone] (Primary) Address: City/State/Zip: [place] [employer] [facility] [place] Night - Client Client Site [place] [facility] [place], [place] Type Call Caller Name [place] Phone Number [phone] Relationship To Patient Self Is this call to report lab results? No Call Type General Information Initial Comment Caller states she needs to follow up on a prescription request from [place] from 10/09/14 General Information Type Message Only Nurse Assessment Guidelines Guideline Title Affirmed Question Affirmed Notes Nurse Date/Time (Eastern Time) Disp. Time Eilene Ghazi Time) Disposition Final User 10/17/2014 1:01:51 PM General Information Provided Yes Nyoka Cowden, Amy After Care Instructions Given Call Event Type User Date / Time Description

## 2014-10-25 ENCOUNTER — Ambulatory Visit
Admission: RE | Admit: 2014-10-25 | Discharge: 2014-10-25 | Disposition: A | Discharge status: Home / Self Care | Payer: 59 | Source: Ambulatory Visit

## 2014-10-25 DIAGNOSIS — Z1231 Encounter for screening mammogram for malignant neoplasm of breast: Secondary | ICD-10-CM

## 2014-10-30 ENCOUNTER — Other Ambulatory Visit: Payer: Self-pay | Admitting: Obstetrics & Gynecology

## 2014-10-30 DIAGNOSIS — R928 Other abnormal and inconclusive findings on diagnostic imaging of breast: Secondary | ICD-10-CM

## 2014-11-02 ENCOUNTER — Ambulatory Visit
Admission: RE | Admit: 2014-11-02 | Discharge: 2014-11-02 | Disposition: A | Discharge status: Home / Self Care | Payer: 59 | Source: Ambulatory Visit | Attending: Obstetrics & Gynecology | Admitting: Obstetrics & Gynecology

## 2014-11-02 DIAGNOSIS — R928 Other abnormal and inconclusive findings on diagnostic imaging of breast: Secondary | ICD-10-CM

## 2014-11-09 ENCOUNTER — Other Ambulatory Visit: Payer: Self-pay

## 2014-12-03 ENCOUNTER — Other Ambulatory Visit (INDEPENDENT_AMBULATORY_CARE_PROVIDER_SITE_OTHER): Payer: 59

## 2014-12-03 ENCOUNTER — Other Ambulatory Visit: Payer: Self-pay

## 2014-12-03 DIAGNOSIS — E785 Hyperlipidemia, unspecified: Secondary | ICD-10-CM

## 2014-12-03 LAB — HEPATIC FUNCTION PANEL
ALK PHOS: 42 U/L (ref 39–117)
ALT: 17 U/L (ref 0–35)
AST: 17 U/L (ref 0–37)
Albumin: 4.2 g/dL (ref 3.5–5.2)
Bilirubin, Direct: 0.1 mg/dL (ref 0.0–0.3)
Total Bilirubin: 0.5 mg/dL (ref 0.2–1.2)
Total Protein: 6.7 g/dL (ref 6.0–8.3)

## 2014-12-03 LAB — BASIC METABOLIC PANEL
BUN: 15 mg/dL (ref 6–23)
CALCIUM: 9.9 mg/dL (ref 8.4–10.5)
CO2: 27 meq/L (ref 19–32)
Chloride: 107 mEq/L (ref 96–112)
Creatinine, Ser: 0.99 mg/dL (ref 0.40–1.20)
GFR: 58.68 mL/min — AB (ref >60.00)
GLUCOSE: 90 mg/dL (ref 70–99)
Potassium: 4.4 mEq/L (ref 3.5–5.1)
Sodium: 140 mEq/L (ref 135–145)

## 2014-12-05 LAB — NMR LIPOPROFILE WITH LIPIDS
Cholesterol, Total: 179 mg/dL (ref 100–199)
HDL Particle Number: 41.5 umol/L (ref >=30.5)
HDL SIZE: 9.2 nm (ref >=9.2)
HDL-C: 58 mg/dL (ref >39)
LDL (calc): 90 mg/dL (ref 0–99)
LDL PARTICLE NUMBER: 1250 nmol/L — AB (ref <1000)
LDL Size: 20.8 nm (ref >=20.8)
LP-IR SCORE: 53 — AB (ref <=45)
Large HDL-P: 7.4 umol/L (ref >=4.8)
Large VLDL-P: 6.6 nmol/L — ABNORMAL HIGH (ref <=2.7)
SMALL LDL PARTICLE NUMBER: 629 nmol/L — AB (ref <=527)
Triglycerides: 153 mg/dL — ABNORMAL HIGH (ref 0–149)
VLDL SIZE: 48.3 nm — AB (ref <=46.6)

## 2015-02-05 ENCOUNTER — Encounter: Payer: Self-pay | Admitting: Neurology

## 2015-02-05 ENCOUNTER — Ambulatory Visit (INDEPENDENT_AMBULATORY_CARE_PROVIDER_SITE_OTHER): Payer: Medicare Other | Admitting: Neurology

## 2015-02-05 VITALS — BP 112/64 | HR 68 | Resp 16 | Ht 67.0 in | Wt 193.0 lb

## 2015-02-05 DIAGNOSIS — G250 Essential tremor: Secondary | ICD-10-CM | POA: Diagnosis not present

## 2015-02-05 NOTE — Patient Instructions (Signed)
We will do a yearly check up for your tremors, which are still very mild.  We may consider down the road a trial of a medication called Mysoline (primidone).

## 2015-02-05 NOTE — Progress Notes (Signed)
Subjective:    Patient ID: Melissa Lowe is a 74 y.o. female.  HPI     Interim history:   Melissa Lowe is a 74 year old right-handed woman with an underlying medical history of asthma, hypothyroidism and hyperlipidemia, who presents for follow-up consultation of her tremors. The patient is accompanied by her husband today. This is her first visit with me and she previously saw Dr. Jim Like was last seen by him on 07/04/2014 at which time he suggested a 6 mo follow-up and observation, as her tremors were mild. Prior to that, she had seen Dr. Erling Cruz, last in December 2013. She had a new onset lip tremor in or around 2010 which was progressive and then developed hand tremors, left more than right as well as difficulty holding something such as a fork or bulk. She did not have much in the way of improvement with alcohol consumption. She did note than asthma medication made her tremors worse. She does have a family history of tremors in her mother.  Today, 02/05/2015: She is able to provide her own history. Her husband and some details. she reports feeling about the same, no significant worsening of her tremor. She is not impaired in her day-to-day functioning. Her left hand is more affected. She has a lip quiver but no voice tremor. She has had no balance issues and no recent falls. She is not keen on trying any new medications. She is trying to stay active but may not exercise regularly. She drinks enough water she feels. She sleeps well.  Her Past Medical History Is Significant For: Past Medical History  Diagnosis Date  . Asthma   . Hypothyroidism   . Hyperlipidemia     Her Past Surgical History Is Significant For: Past Surgical History  Procedure Laterality Date  . Septoplasty    . Cholecystectomy    . Colon polypectomy  2001    Dr Earlean Shawl  . Cataract extraction    . Abdominal hysterectomy      2  fibroids  . Repair fascial defect leg  1972  . Anterior and posterior repair N/A  01/30/2013    Procedure:  POSTERIOR REPAIR (RECTOCELE);  Surgeon: Princess Bruins, MD;  Location: [facility];  Service: Gynecology;  Laterality: N/A;  . Colonoscopy  12/25/13    Tics , Dr Lajoyce Lauber 2020    Her Family History Is Significant For: Family History  Problem Relation Age of Onset  . Dementia Mother   . Asthma Sister   . Breast cancer Sister   . Stroke Maternal Grandmother     in 30s  . Stroke Paternal Grandmother 89  . Hypertension Neg Hx   . Heart disease Neg Hx     Her Social History Is Significant For: History   Social History  . Marital Status: Married    Spouse Name: [name]  . Number of Children: 0  . Years of Education: Master's    Social History Main Topics  . Smoking status: Never Smoker   . Smokeless tobacco: Never Used  . Alcohol Use: Yes     Comment: Wine couple days weekly  . Drug Use: No  . Sexual Activity: Not on file   Other Topics Concern  . Not on file   Social History Narrative   Patient lives at home with her husband [name].    Patient has a Master's Degree and post grad.    Patient has no children.    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    Her Allergies Are:  Allergies  Allergen Reactions  . Sulfonamide Derivatives     Rash on inside of arms Because of a history of documented adverse serious drug reaction;Medi Alert bracelet  is recommended  . Tetracycline     itching  :   Her Current Medications Are:  Outpatient Encounter Prescriptions as of 02/05/2015  Medication Sig  . albuterol (PROAIR HFA) 108 (90 BASE) MCG/ACT inhaler Inhale 1 puff into the lungs as needed.  . Ascorbic Acid (VITAMIN C) 500 MG tablet Take 500 mg by mouth daily.    Marland Kitchen aspirin 81 MG tablet Take 81 mg by mouth daily.  . bimatoprost (LATISSE) 0.03 % ophthalmic solution Place into both eyes 3 (three) times a week. Place one drop on applicator and apply evenly along the skin of the upper eyelid at base of eyelashes once daily at bedtime; repeat  procedure for second eye (use a clean applicator).  . budesonide-formoterol (SYMBICORT) 80-4.5 MCG/ACT inhaler 1 puff qd  . Calcium Carbonate-Vitamin D (CALTRATE 600+D) 600-400 MG-UNIT per chew tablet Chew 1 tablet by mouth daily.  . Cholecalciferol (VITAMIN D3) 1000 UNITS CAPS Take by mouth daily.  Marland Kitchen estradiol (MENOSTAR) 14 MCG/24HR Place 1 patch onto the skin once a week. Changes patch every Saturday.  . levothyroxine (SYNTHROID, LEVOTHROID) 50 MCG tablet Take 1 tablet (50 mcg total) by mouth daily.  . montelukast (SINGULAIR) 10 MG tablet TAKE 1 TABLET DAILY  . Multiple Vitamin (MULTIVITAMIN) capsule Take 1 capsule by mouth daily.    Marland Kitchen omeprazole (PRILOSEC) 20 MG capsule Take 20 mg by mouth daily.   . pravastatin (PRAVACHOL) 40 MG tablet Take 1 tablet (40 mg total) by mouth at bedtime.  Marland Kitchen UNABLE TO FIND Fiber- Vita Fusion Gummies take by mouth  :  Review of Systems:  Out of a complete 14 point review of systems, all are reviewed and negative with the exception of these symptoms as listed below:   Review of Systems  Neurological: Positive for tremors.    Objective:  Neurologic Exam  Physical Exam Physical Examination:   Filed Vitals:   02/05/15 1400  BP: 112/64  Pulse: 68  Resp: 16   General Examination: The patient is a very pleasant 74 y.o. female in no acute distress. She appears well-developed and well-nourished and very well groomed.   HEENT: Normocephalic, atraumatic, pupils are equal, round and reactive to light and accommodation. Funduscopic exam is normal with sharp disc margins noted. Extraocular tracking is good without limitation to gaze excursion or nystagmus noted. Normal smooth pursuit is noted. Hearing is grossly intact. Tympanic membranes are clear bilaterally. Face is symmetric with normal facial animation and normal facial sensation. Speech is clear with no dysarthria noted. There is no hypophonia. There is no voice tremor but she has a very mild lower lip  tremor. With eyes closed, she has a mild yes-yes type head tremor. Neck is supple with full range of passive and active motion. There are no carotid bruits on auscultation. Oropharynx exam reveals: mild mouth dryness, good dental hygiene and mild airway crowding, due to narrow airway entry. Mallampati is class II. Tongue protrudes centrally and palate elevates symmetrically.   Chest: Clear to auscultation without wheezing, rhonchi or crackles noted.  Heart: S1+S2+0, regular and normal without murmurs, rubs or gallops noted.   Abdomen: Soft, non-tender and non-distended with normal bowel sounds appreciated on auscultation.  Extremities: There is no pitting edema in the distal lower extremities bilaterally. Pedal  pulses are intact.  Skin: Warm and dry without trophic changes noted. There are no varicose veins.  Musculoskeletal: exam reveals no obvious joint deformities, tenderness or joint swelling or erythema.   Neurologically:  Mental status: The patient is awake, alert and oriented in all 4 spheres. Her immediate and remote memory, attention, language skills and fund of knowledge are appropriate. There is no evidence of aphasia, agnosia, apraxia or anomia. Speech is clear with normal prosody and enunciation. Thought process is linear. Mood is normal and affect is normal.  Cranial nerves II - XII are as described above under HEENT exam. In addition: shoulder shrug is normal with equal shoulder height noted. Motor exam: Normal bulk, strength and tone is noted. There is no drift, resting tremor or rebound. She has a minimal right upper extremity and a mild intermittent left upper extremity postural tremor and no significant action tremor with both hands. She has no lower extremity tremor. On Archimedes spiral drawing she has minimal tremulousness noticed only with the left hand. Right-handed spiral is unremarkable. Handwriting is minimally tremulous and very legible. She has no evidence of  micrographia.  Romberg is negative. Reflexes are 2+ throughout. Fine motor skills and coordination: intact with normal finger taps, normal hand movements, normal rapid alternating patting, normal foot taps and normal foot agility.  Cerebellar testing: No dysmetria or intention tremor on finger to nose testing. Heel to shin is unremarkable bilaterally. There is no truncal or gait ataxia.  Sensory exam: intact to light touch, pinprick, vibration, temperature sense in the upper and lower extremities.  Gait, station and balance: She stands easily. No veering to one side is noted. No leaning to one side is noted. Posture is age-appropriate and stance is narrow based. Gait shows normal stride length and normal pace. No problems turning are noted. She turns en bloc. Tandem walk is unremarkable.  she has preserved arm swing when walking.   Assessment and Plan:   In summary, Melissa Lowe is a very pleasant 74 y.o.-year old female with an underlying medical history of asthma, hypothyroidism and hyperlipidemia, who presents for follow-up consultation of her tremors. 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 I talked to the patient and her husband about the diagnosis of ET, the prognosis and treatment options. They do understand that there is no curative treatment and symptomatic treatment is of course geared towards reducing the tremor intensity. She has a very mild tremor at this time. I reassured her that I did not detect any parkinsonism. Because of her asthma she is not a candidate for beta blocker. For future use, we can consider a small dose of Mysoline. We mutually agreed to hold off at this time because she is not functionally impaired and/or socially embarrassed. I think her tremors rather mild and we can observe this clinically. I suggested a one-year checkup, we can certainly move the appointment up if she feels that she has gotten worse and would like to pick up the discussion  about symptomatic treatment. I answered all her questions today and the patient and her husband were in agreement. I spent 20 minutes in total face-to-face time with the patient, more than 50% of which was spent in counseling and coordination of care, reviewing test results, reviewing medication and discussing or reviewing the diagnosis of ET, the prognosis and treatment options.

## 2015-03-15 ENCOUNTER — Telehealth: Payer: Self-pay | Admitting: Internal Medicine

## 2015-03-15 NOTE — Telephone Encounter (Signed)
Advised patient of dr hoppers note, patient wrote instructions down and repeated back for understanding, she will try this weekend, if not better, will call back for office visit

## 2015-03-15 NOTE — Telephone Encounter (Signed)

## 2015-03-15 NOTE — Telephone Encounter (Signed)
Please advise, thanks.

## 2015-03-15 NOTE — Telephone Encounter (Signed)
Patient is feeling bad and congested and was wondering what Dr. Linna Darner would recommend for OTC medication. Please call patient

## 2015-03-26 ENCOUNTER — Other Ambulatory Visit: Payer: Self-pay

## 2015-03-26 ENCOUNTER — Other Ambulatory Visit: Payer: Self-pay | Admitting: Internal Medicine

## 2015-03-26 MED ORDER — LEVOTHYROXINE SODIUM 50 MCG PO TABS: 50.0000 ug | ORAL_TABLET | Freq: Every day | ORAL | Status: DC

## 2015-03-26 NOTE — Telephone Encounter (Signed)
Synthroid rx sent to pharm---patient needs office visit before any further refills

## 2015-04-01 ENCOUNTER — Ambulatory Visit (INDEPENDENT_AMBULATORY_CARE_PROVIDER_SITE_OTHER): Payer: Medicare Other | Admitting: Internal Medicine

## 2015-04-01 ENCOUNTER — Encounter: Payer: Self-pay | Admitting: Internal Medicine

## 2015-04-01 VITALS — BP 124/68 | HR 74 | Temp 98.5°F | Resp 16 | Ht 67.0 in | Wt 195.1 lb

## 2015-04-01 DIAGNOSIS — J209 Acute bronchitis, unspecified: Secondary | ICD-10-CM | POA: Diagnosis not present

## 2015-04-01 MED ORDER — HYDROCODONE-HOMATROPINE 5-1.5 MG/5ML PO SYRP: 5.0000 mL | ORAL_SOLUTION | Freq: Four times a day (QID) | ORAL | Status: DC | PRN

## 2015-04-01 MED ORDER — PREDNISONE 20 MG PO TABS: 20.0000 mg | ORAL_TABLET | Freq: Two times a day (BID) | ORAL | Status: DC

## 2015-04-01 MED ORDER — AZITHROMYCIN 250 MG PO TABS: ORAL_TABLET | ORAL | Status: DC

## 2015-04-01 NOTE — Progress Notes (Signed)
Pre visit review using our clinic review tool, if applicable. No additional management support is needed unless otherwise documented below in the visit note. 

## 2015-04-01 NOTE — Patient Instructions (Addendum)
Fill the  prescription for Prednisone if cough not better in the next 72  Hours with new medications. If you have post nasal drainage: Plain Mucinex (NOT D) for thick secretions ;force NON dairy fluids .   Nasal cleansing in the shower as discussed with lather of mild shampoo.After 10 seconds wash off lather while  exhaling through nostrils. Make sure that all residual soap is removed to prevent irritation.  Flonase OR Nasacort AQ 1 spray in each nostril twice a day as needed. Use the "crossover" technique into opposite nostril spraying toward opposite ear @ 45 degree angle, not straight up into nostril.  Plain Allegra (NOT D )  160 daily , Loratidine 10 mg , OR Zyrtec 10 mg @ bedtime  as needed for itchy eyes & sneezing.

## 2015-04-01 NOTE — Progress Notes (Signed)
   Subjective:    Patient ID: Melissa Lowe, female    DOB: 06-16-1941, 74 y.o.   MRN: [id number]  HPI Symptoms began 2 weeks ago as sore throat and cough. She did employ nasal hygiene except for Flonase or Nasacort. She stopped Mucinex after 1 week and has been using Robitussin-DM at night. She also is using Hall's drops  She continues to have a nonproductive cough. Nasal secretions are clear as postnasal drainage. She's had trouble sleeping due to the cough. She questions some left submandibular tender lymphadenopathy. She also has some discomfort in the temples by the end of the day which she attributes to eye strain reading with progressive contacts  She denies any upper respiratory infection symptoms.   Review of Systems Frontal headache, facial pain , nasal purulence, dental pain, sore throat , otic pain or otic discharge denied. No fever , chills or sweats.  She denies wheezing or shortness of breath.  She's also had no otic pain or otic discharge. She denies significant extrinsic symptoms of watery, itchy eyes.    Objective:   Physical Exam Exam is unremarkable except for fine head tremor. She has minimal erythema of the nasal mucosa.  General appearance:Adequately nourished; no acute distress or increased work of breathing is present.    Lymphatic: No  lymphadenopathy about the head, neck, or axilla .  Eyes: No conjunctival inflammation or lid edema is present. There is no scleral icterus.  Ears:  External ear exam shows no significant lesions or deformities.  Otoscopic examination reveals clear canals, tympanic membranes are intact bilaterally without bulging, retraction, inflammation or discharge.  Nose:  External nasal examination shows no deformity or inflammation. No septal dislocation or deviation.No obstruction to airflow.   Oral exam: Dental hygiene is good; lips and gums are healthy appearing.There is no oropharyngeal erythema or exudate .  Neck:  No  deformities, thyromegaly, masses, or tenderness noted.   Supple with full range of motion without pain.   Heart:  Normal rate and regular rhythm. S1 and S2 normal without gallop, murmur, click, rub or other extra sounds.   Lungs:Chest clear to auscultation; no wheezes, rhonchi,rales ,or rubs present.  Extremities:  No cyanosis, edema, or clubbing  noted    Skin: Warm & dry w/o tenting or jaundice. No significant lesions or rash.        Assessment & Plan:  #1 acute bronchitis without bronchospasm  See orders and recommendations

## 2015-05-21 ENCOUNTER — Ambulatory Visit (INDEPENDENT_AMBULATORY_CARE_PROVIDER_SITE_OTHER): Payer: Medicare Other | Admitting: Internal Medicine

## 2015-05-21 ENCOUNTER — Encounter: Payer: Self-pay | Admitting: Internal Medicine

## 2015-05-21 VITALS — BP 116/78 | HR 83 | Temp 98.7°F | Resp 16 | Wt 194.0 lb

## 2015-05-21 DIAGNOSIS — J310 Chronic rhinitis: Secondary | ICD-10-CM | POA: Diagnosis not present

## 2015-05-21 MED ORDER — HYDROCODONE-HOMATROPINE 5-1.5 MG/5ML PO SYRP: 5.0000 mL | ORAL_SOLUTION | Freq: Four times a day (QID) | ORAL | Status: DC | PRN

## 2015-05-21 NOTE — Patient Instructions (Signed)
Plain Mucinex (NOT D) for thick secretions ;force NON dairy fluids .   Nasal cleansing in the shower as discussed with lather of mild shampoo.After 10 seconds wash off lather while  exhaling through nostrils. Make sure that all residual soap is removed to prevent irritation.  Flonase OR Nasacort AQ 1 spray in each nostril twice a day as needed. Use the "crossover" technique into opposite nostril spraying toward opposite ear @ 45 degree angle, not straight up into nostril.  Plain Allegra (NOT D )  160 daily , Loratidine 10 mg , OR Zyrtec 10 mg @ bedtime  as needed for itchy eyes & sneezing.  Zicam Melts or Zinc lozenges as per package label for sore throat . Complementary options include  vitamin C 2000 mg daily; & Echinacea for 4-7 days. Report persistent or progressive fever; discolored nasal or chest secretions; or frontal headache or facial  Pain as discussed.

## 2015-05-21 NOTE — Progress Notes (Signed)
Pre visit review using our clinic review tool, if applicable. No additional management support is needed unless otherwise documented below in the visit note. 

## 2015-05-21 NOTE — Progress Notes (Signed)
   Subjective:    Patient ID: Melissa Lowe, female    DOB: Jan 08, 1941, 74 y.o.   MRN: [id number]  HPI  The evening of 7/23 she started to develop a sore throat. The next day this  persisted and was associated with sneezing. This is in the context of having been exposed to an infant relative who appeared to be ill. She also flown to and from [facility]. She's had some associated bitemporal headache as well as some frontal pressure. She blames the former on difference in her eye prescriptions from her contacts and glasses. She now has developed hoarseness and dry cough. She's been using Mucinex. Additionally she has developed clear rhinitis. She describes some chills.  Review of Systems  She denies facial or sinus pain below the eyes. She has no itchy, watery eyes. She has no dental pain, otic pain, otic discharge. The cough is not associated with shortness of breath or wheezing.     Objective:   Physical Exam  She has intermittent head tremor. There is mild erythema of the uvula. She is slightly hoarse.  General appearance:Adequately nourished; no acute distress or increased work of breathing is present.    Lymphatic: No  lymphadenopathy about the head, neck, or axilla .  Eyes: No conjunctival inflammation or lid edema is present. There is no scleral icterus.  Ears:  External ear exam shows no significant lesions or deformities.  Otoscopic examination reveals clear canals, tympanic membranes are intact bilaterally without bulging, retraction, inflammation or discharge.  Nose:  External nasal examination shows no deformity or inflammation. Nasal mucosa are pink and moist without lesions or exudates No septal dislocation or deviation.No obstruction to airflow.   Oral exam: Dental hygiene is good; lips and gums are healthy appearing.There is no oropharyngeal exudate .  Neck:  No deformities, thyromegaly, masses, or tenderness noted.   Supple with full range of motion without pain.    Heart:  Normal rate and regular rhythm. S1 and S2 normal without gallop, murmur, click, rub or other extra sounds.   Lungs:Chest clear to auscultation; no wheezes, rhonchi,rales ,or rubs present.  Extremities:  No cyanosis, edema, or clubbing  noted    Skin: Warm & dry w/o tenting or jaundice. No significant lesions or rash.       Assessment & Plan:  #1 viral rhinitis with phraryngitis. All Centor criteria negative.  See after visit summary

## 2015-06-12 ENCOUNTER — Telehealth: Payer: Self-pay | Admitting: Internal Medicine

## 2015-06-12 NOTE — Telephone Encounter (Signed)
Pt is going on cruise and will be 5 pills short of pravastatin (PRAVACHOL) 40 MG tablet [[id number] Pharmacy needs signed prescription to refill Pharmacy is [facility] [address]

## 2015-06-13 ENCOUNTER — Other Ambulatory Visit: Payer: Self-pay | Admitting: Emergency Medicine

## 2015-06-13 MED ORDER — PRAVASTATIN SODIUM 40 MG PO TABS: 40.0000 mg | ORAL_TABLET | Freq: Every day | ORAL | Status: DC

## 2015-07-11 ENCOUNTER — Other Ambulatory Visit: Payer: Self-pay | Admitting: Internal Medicine

## 2015-07-11 ENCOUNTER — Telehealth: Payer: Self-pay | Admitting: Emergency Medicine

## 2015-07-11 DIAGNOSIS — E785 Hyperlipidemia, unspecified: Secondary | ICD-10-CM

## 2015-07-11 DIAGNOSIS — R946 Abnormal results of thyroid function studies: Secondary | ICD-10-CM

## 2015-07-11 NOTE — Telephone Encounter (Signed)
Pt is requesting that she has labs done before her visit on 9/21. She is not currently taking a multi vit and would like to get a test done to see if she needs to continue back on the multi vit. She requested that she have her TSH tested. Best call back number is [phone]. Pt is okay if message is left on Voice mail.

## 2015-07-11 NOTE — Telephone Encounter (Signed)
done

## 2015-07-12 NOTE — Telephone Encounter (Signed)
LVM to inform pt that labs are in.

## 2015-07-15 ENCOUNTER — Other Ambulatory Visit (INDEPENDENT_AMBULATORY_CARE_PROVIDER_SITE_OTHER): Payer: Medicare Other

## 2015-07-15 DIAGNOSIS — E785 Hyperlipidemia, unspecified: Secondary | ICD-10-CM

## 2015-07-15 DIAGNOSIS — R946 Abnormal results of thyroid function studies: Secondary | ICD-10-CM

## 2015-07-15 LAB — BASIC METABOLIC PANEL
BUN: 21 mg/dL (ref 6–23)
CHLORIDE: 106 meq/L (ref 96–112)
CO2: 27 meq/L (ref 19–32)
Calcium: 9.5 mg/dL (ref 8.4–10.5)
Creatinine, Ser: 0.95 mg/dL (ref 0.40–1.20)
GFR: 61.44 mL/min (ref >60.00)
GLUCOSE: 77 mg/dL (ref 70–99)
POTASSIUM: 4.6 meq/L (ref 3.5–5.1)
Sodium: 140 mEq/L (ref 135–145)

## 2015-07-15 LAB — LIPID PANEL
CHOLESTEROL: 163 mg/dL (ref 0–200)
HDL: 54 mg/dL (ref >39.00)
LDL Cholesterol: 75 mg/dL (ref 0–99)
NONHDL: 109.37
Total CHOL/HDL Ratio: 3
Triglycerides: 171 mg/dL — ABNORMAL HIGH (ref 0.0–149.0)
VLDL: 34.2 mg/dL (ref 0.0–40.0)

## 2015-07-15 LAB — HEPATIC FUNCTION PANEL
ALBUMIN: 4.1 g/dL (ref 3.5–5.2)
ALT: 14 U/L (ref 0–35)
AST: 14 U/L (ref 0–37)
Alkaline Phosphatase: 42 U/L (ref 39–117)
Bilirubin, Direct: 0.1 mg/dL (ref 0.0–0.3)
TOTAL PROTEIN: 6.7 g/dL (ref 6.0–8.3)
Total Bilirubin: 0.3 mg/dL (ref 0.2–1.2)

## 2015-07-15 LAB — TSH: TSH: 1.62 u[IU]/mL (ref 0.35–4.50)

## 2015-07-17 ENCOUNTER — Ambulatory Visit (INDEPENDENT_AMBULATORY_CARE_PROVIDER_SITE_OTHER): Payer: Medicare Other | Admitting: Internal Medicine

## 2015-07-17 ENCOUNTER — Other Ambulatory Visit (INDEPENDENT_AMBULATORY_CARE_PROVIDER_SITE_OTHER): Payer: Medicare Other

## 2015-07-17 ENCOUNTER — Encounter: Payer: Self-pay | Admitting: Internal Medicine

## 2015-07-17 ENCOUNTER — Ambulatory Visit (INDEPENDENT_AMBULATORY_CARE_PROVIDER_SITE_OTHER)
Admission: RE | Admit: 2015-07-17 | Discharge: 2015-07-17 | Disposition: A | Discharge status: Home / Self Care | Payer: Medicare Other | Source: Ambulatory Visit | Attending: Internal Medicine | Admitting: Internal Medicine

## 2015-07-17 VITALS — BP 116/78 | HR 62 | Temp 98.4°F | Resp 16 | Wt 197.0 lb

## 2015-07-17 DIAGNOSIS — K219 Gastro-esophageal reflux disease without esophagitis: Secondary | ICD-10-CM

## 2015-07-17 DIAGNOSIS — E559 Vitamin D deficiency, unspecified: Secondary | ICD-10-CM | POA: Diagnosis not present

## 2015-07-17 DIAGNOSIS — E785 Hyperlipidemia, unspecified: Secondary | ICD-10-CM | POA: Diagnosis not present

## 2015-07-17 DIAGNOSIS — J45909 Unspecified asthma, uncomplicated: Secondary | ICD-10-CM

## 2015-07-17 DIAGNOSIS — Z23 Encounter for immunization: Secondary | ICD-10-CM | POA: Diagnosis not present

## 2015-07-17 DIAGNOSIS — R946 Abnormal results of thyroid function studies: Secondary | ICD-10-CM

## 2015-07-17 DIAGNOSIS — R05 Cough: Secondary | ICD-10-CM

## 2015-07-17 DIAGNOSIS — R059 Cough, unspecified: Secondary | ICD-10-CM

## 2015-07-17 LAB — CBC WITH DIFFERENTIAL/PLATELET
Basophils Absolute: 0 10*3/uL (ref 0.0–0.1)
Basophils Relative: 0.6 % (ref 0.0–3.0)
EOS ABS: 0.1 10*3/uL (ref 0.0–0.7)
EOS PCT: 1.7 % (ref 0.0–5.0)
HCT: 40.7 % (ref 36.0–46.0)
HEMOGLOBIN: 13.6 g/dL (ref 12.0–15.0)
LYMPHS ABS: 1.7 10*3/uL (ref 0.7–4.0)
Lymphocytes Relative: 25.5 % (ref 12.0–46.0)
MCHC: 33.5 g/dL (ref 30.0–36.0)
MCV: 88.4 fl (ref 78.0–100.0)
MONO ABS: 0.4 10*3/uL (ref 0.1–1.0)
Monocytes Relative: 6.3 % (ref 3.0–12.0)
NEUTROS PCT: 65.9 % (ref 43.0–77.0)
Neutro Abs: 4.3 10*3/uL (ref 1.4–7.7)
Platelets: 266 10*3/uL (ref 150.0–400.0)
RBC: 4.61 Mil/uL (ref 3.87–5.11)
RDW: 15 % (ref 11.5–15.5)
WBC: 6.6 10*3/uL (ref 4.0–10.5)

## 2015-07-17 LAB — VITAMIN D 25 HYDROXY (VIT D DEFICIENCY, FRACTURES): VITD: 46 ng/mL (ref 30.00–100.00)

## 2015-07-17 MED ORDER — LEVOTHYROXINE SODIUM 50 MCG PO TABS: 50.0000 ug | ORAL_TABLET | Freq: Every day | ORAL | Status: DC

## 2015-07-17 MED ORDER — MONTELUKAST SODIUM 10 MG PO TABS: ORAL_TABLET | ORAL | Status: DC

## 2015-07-17 MED ORDER — ALBUTEROL SULFATE HFA 108 (90 BASE) MCG/ACT IN AERS: 1.0000 | INHALATION_SPRAY | RESPIRATORY_TRACT | Status: DC | PRN

## 2015-07-17 MED ORDER — PRAVASTATIN SODIUM 40 MG PO TABS: 40.0000 mg | ORAL_TABLET | Freq: Every day | ORAL | Status: DC

## 2015-07-17 NOTE — Patient Instructions (Addendum)
The most common cause of elevated triglycerides (TG) is the ingestion of sugar from high fructose corn syrup sources added to processed foods & drinks or sugar in alcohol.   Consume less than 30 Grams (preferably ZERO) of sugar per day from foods & drinks with High Fructose Corn Syrup (HFCS) sugar as #1,2,3 or # 4 on label.[employer], [employer] [place] do not carry products with HFCS.  Alcohol intake is recommended as less than 1-2 drinks per day for men &  less than 1 drink per day for women.  All other lab results are excellent.  Plain Mucinex (NOT D) for thick secretions ;force NON dairy fluids .   Nasal cleansing in the shower as discussed with lather of mild shampoo.After 10 seconds wash off lather while  exhaling through nostrils. Make sure that all residual soap is removed to prevent irritation.  Flonase OR Nasacort AQ 1 spray in each nostril twice a day as needed. Use the "crossover" technique into opposite nostril spraying toward opposite ear @ 45 degree angle, not straight up into nostril.  Plain Allegra (NOT D )  160 daily , Loratidine 10 mg , OR Zyrtec 10 mg @ bedtime  as needed for itchy eyes & sneezing.  Reflux of gastric acid may be asymptomatic as this may occur mainly during sleep.The triggers for reflux  include stress; the "aspirin family" ; alcohol; peppermint; and caffeine (coffee, tea, cola, and chocolate). The aspirin family would include aspirin and the nonsteroidal agents such as ibuprofen &  Naproxen. Tylenol would not cause reflux. If having symptoms ; food & drink should be avoided for @ least 2 hours before going to bed.

## 2015-07-17 NOTE — Progress Notes (Signed)
   Subjective:    Patient ID: Melissa Lowe, female    DOB: 02/07/1941, 74 y.o.   MRN: [id number]  HPI The patient is here to assess status of active health conditions.  PMH, FH, & Social History reviewed & updated.No change in [place] as recorded.  She avoids fried foods, excess red meat and does not add salt at the table. She is not exercising.  Her labs were reviewed; triglycerides were elevated but unfortunately she was not fasting when these were done. They were collected after lunch.  She continues to have some cough; but it is dramatically improved compared to July. At that time it was severe and very productive. It is now intermittent, mainly during the day. She feels she produces approximately a teaspoon of sputum from the throat 6 times a day; but it is not visualized as she swallows it.  She never uses albuterol. She is on Symbicort each morning and Singulair @ night.  She denies any upper respiratory tract infection symptoms.   She is on a PPI  & denies any significant reflux symptoms. Colonoscopy is current some: She has no active GI symptoms.   Review of Systems  Chest pain, palpitations, tachycardia, exertional dyspnea, paroxysmal nocturnal dyspnea, claudication or edema are absent. No unexplained weight loss, abdominal pain, significant dyspepsia, dysphagia, melena, rectal bleeding, or persistently small caliber stools. Dysuria, pyuria, hematuria, frequency, nocturia or polyuria are denied. Change in hair, skin, nails denied. No bowel changes of constipation or diarrhea. No intolerance to heat or cold. Frontal headache, facial pain , nasal purulence, dental pain, sore throat , otic pain or otic discharge denied. No fever , chills or sweats.      Objective:   Physical Exam  Pertinent or positive findings include: She has minimal erythema of the nasal mucosa. There is some increased hirsutism over the upper lip. Slight clubbing suggested. She has minimal crepitus in the  knees, right greater than left. She has pain with range of motion testing on the right. Intermittent involuntary head tremor present.  General appearance :adequately nourished; in no distress.  Eyes: No conjunctival inflammation or scleral icterus is present.  Oral exam:  Lips and gums are healthy appearing.There is no oropharyngeal erythema or exudate noted. Dental hygiene is good.  Heart:  Normal rate and regular rhythm. S1 and S2 normal without gallop, murmur, click, rub or other extra sounds    Lungs:Chest clear to auscultation; no wheezes, rhonchi,rales ,or rubs present.No increased work of breathing.   Abdomen: bowel sounds normal, soft and non-tender without masses, organomegaly or hernias noted.  No guarding or rebound.   Vascular : all pulses equal ; no bruits present.  Skin:Warm & dry.  Intact without suspicious lesions or rashes ; no tenting or jaundice   Lymphatic: No lymphadenopathy is noted about the head, neck, axilla   Neuro: Strength, tone & DTRs normal.      Assessment & Plan:  See Current Assessment & Plan in Problem List under specific Diagnosis

## 2015-07-17 NOTE — Assessment & Plan Note (Signed)
TSH therapeutic; no change

## 2015-07-17 NOTE — Assessment & Plan Note (Signed)
See AVS recommendations

## 2015-07-17 NOTE — Progress Notes (Signed)
Pre visit review using our clinic review tool, if applicable. No additional management support is needed unless otherwise documented below in the visit note. 

## 2015-07-17 NOTE — Assessment & Plan Note (Addendum)
CXray & CBC & dif because of persistent cough since 7/16

## 2015-09-02 ENCOUNTER — Encounter: Payer: Self-pay | Admitting: Internal Medicine

## 2015-09-02 ENCOUNTER — Ambulatory Visit (INDEPENDENT_AMBULATORY_CARE_PROVIDER_SITE_OTHER): Payer: Medicare Other | Admitting: Internal Medicine

## 2015-09-02 VITALS — BP 128/76 | HR 74 | Temp 99.1°F | Resp 18 | Wt 197.0 lb

## 2015-09-02 DIAGNOSIS — J069 Acute upper respiratory infection, unspecified: Secondary | ICD-10-CM

## 2015-09-02 DIAGNOSIS — J45901 Unspecified asthma with (acute) exacerbation: Secondary | ICD-10-CM

## 2015-09-02 MED ORDER — AZITHROMYCIN 250 MG PO TABS: ORAL_TABLET | ORAL | Status: DC

## 2015-09-02 MED ORDER — HYDROCODONE-HOMATROPINE 5-1.5 MG/5ML PO SYRP: 5.0000 mL | ORAL_SOLUTION | Freq: Four times a day (QID) | ORAL | Status: DC | PRN

## 2015-09-02 NOTE — Patient Instructions (Addendum)
Your antibiotic was sent to your pharmacy.  Your received a prescription cough syrup.    Call if no improvement.

## 2015-09-02 NOTE — Progress Notes (Signed)
Pre visit review using our clinic review tool, if applicable. No additional management support is needed unless otherwise documented below in the visit note. 

## 2015-09-02 NOTE — Progress Notes (Signed)
Subjective:    Patient ID: Melissa Lowe, female    DOB: [birth year], 74 y.o.   MRN: [id number]  HPI  She is here for an acute visit for cold and asthma symptoms.   Her symptoms started over the weekend, but quickly progressed. She states sore throat, tender neck glands, productive cough, wheezing, headaches and some diarrhea which she is not sure if that is related. She has had similar illnesses in the past year and has required antibiotics, but typically does not require steroids. She has been taking her rescue inhaler. She did not use her Symbicort this morning.  She did have a spoonful of prescription cough syrup leftover from her last illness and she did use that last night. That did allow her to sleep.   Medications and allergies reviewed with patient and updated if appropriate.  Patient Active Problem List   Diagnosis Date Noted  . LIPOMA 03/10/2010  . TINNITUS 03/10/2010  . Abnormal involuntary movement 03/10/2010  . DIVERTICULOSIS, COLON 10/18/2008  . THYROID FUNCTION TEST, ABNORMAL 10/18/2008  . Hyperlipidemia 08/08/2007  . HIP PAIN 08/08/2007  . FATIGUE 08/08/2007  . Asthma without acute exacerbation 08/02/2007  . Personal history of colonic polyps 08/02/2007    Past Medical History  Diagnosis Date  . Asthma   . Hypothyroidism   . Hyperlipidemia     Past Surgical History  Procedure Laterality Date  . Septoplasty    . Cholecystectomy    . Colon polypectomy  2001    Dr Earlean Shawl  . Cataract extraction    . Abdominal hysterectomy      2  fibroids  . Repair fascial defect leg  1972  . Anterior and posterior repair N/A 01/30/2013    Procedure:  POSTERIOR REPAIR (RECTOCELE);  Surgeon: Princess Bruins, MD;  Location: [facility];  Service: Gynecology;  Laterality: N/A;  . Colonoscopy  12/25/13    Tics , Dr Lajoyce Lauber 2020    Social History   Social History  . Marital Status: Married    Spouse Name: [name]  . Number of Children: 0  . Years of Education:  Master's    Social History Main Topics  . Smoking status: Never Smoker   . Smokeless tobacco: Never Used  . Alcohol Use: Yes     Comment: Wine couple days weekly  . Drug Use: No  . Sexual Activity: Not Asked   Other Topics Concern  . None   Social History Narrative   Patient lives at home with her husband [name].    Patient has a Master's Degree and post grad.    Patient has no children.    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    Review of Systems  Constitutional: Negative for fever.  HENT: Positive for sore throat. Negative for congestion, ear pain and sinus pressure.        Swollen neck glands  Respiratory: Positive for cough and wheezing. Negative for shortness of breath.   Cardiovascular: Negative for chest pain.  Gastrointestinal: Positive for diarrhea (saturday and sunday).  Musculoskeletal: Negative for myalgias.  Neurological: Positive for headaches. Negative for dizziness and light-headedness.       Objective:   Filed Vitals:   09/02/15 0947  BP: 128/76  Pulse: 74  Temp: 99.1 F (37.3 C)  Resp: 18   Filed Weights   09/02/15 0947  Weight: 197 lb (89.359 kg)   Body mass index is 30.85 kg/(m^2).   Physical Exam  Constitutional: She  appears well-developed and well-nourished. No distress.  HENT:  Head: Normocephalic and atraumatic.  Right Ear: External ear normal.  Left Ear: External ear normal.  Mouth/Throat: Oropharynx is clear and moist. No oropharyngeal exudate.  B/l ear canals and TM normal  Eyes: Conjunctivae are normal.  Neck: Neck supple. No tracheal deviation present. No thyromegaly present.  Cardiovascular: Normal rate, regular rhythm and normal heart sounds.   Pulmonary/Chest: Effort normal. No respiratory distress. She has wheezes. She has no rales.  Musculoskeletal: She exhibits no edema.  Lymphadenopathy:    She has cervical adenopathy.  Skin: She is not diaphoretic.          Assessment & Plan:   Acute  respiratory illness, asthma exacerbation She does have wheezing on exam, but it is mild Likely a bacterial component to the illness Z-Pak prescribed We'll hold off on steroids unless her breathing does not improve over the next couple of days Continue Symbicort rescue inhaler Cough syrup with hydrocodone prescribed Increase rest and fluids Other over-the-counter cold medications as needed including Tylenol/Advil as needed  Call if no improvement or symptoms worsen

## 2015-09-30 ENCOUNTER — Other Ambulatory Visit: Payer: Self-pay

## 2015-09-30 DIAGNOSIS — Z1231 Encounter for screening mammogram for malignant neoplasm of breast: Secondary | ICD-10-CM

## 2015-10-04 ENCOUNTER — Ambulatory Visit: Payer: Medicare Other | Admitting: Internal Medicine

## 2015-10-04 ENCOUNTER — Telehealth: Payer: Self-pay

## 2015-10-04 NOTE — Telephone Encounter (Signed)
Patient called to educate on Medicare Wellness apt. LVM for the patient to call back to educate and schedule for wellness visit.   

## 2015-10-10 NOTE — Telephone Encounter (Signed)
Patient called to educate on Medicare Wellness apt. LVM for the patient to call back to educate and schedule for wellness visit.  2nd outreach

## 2015-11-05 ENCOUNTER — Ambulatory Visit: Payer: Medicare Other

## 2015-11-14 ENCOUNTER — Ambulatory Visit
Admission: RE | Admit: 2015-11-14 | Discharge: 2015-11-14 | Disposition: A | Discharge status: Home / Self Care | Payer: Medicare Other | Source: Ambulatory Visit

## 2015-11-14 DIAGNOSIS — Z1231 Encounter for screening mammogram for malignant neoplasm of breast: Secondary | ICD-10-CM

## 2016-02-06 ENCOUNTER — Ambulatory Visit: Payer: Self-pay | Admitting: Neurology

## 2016-02-20 ENCOUNTER — Ambulatory Visit (INDEPENDENT_AMBULATORY_CARE_PROVIDER_SITE_OTHER): Payer: Medicare Other | Admitting: Neurology

## 2016-02-20 ENCOUNTER — Encounter: Payer: Self-pay | Admitting: Neurology

## 2016-02-20 VITALS — BP 132/70 | HR 74 | Resp 16 | Ht 67.0 in | Wt 202.0 lb

## 2016-02-20 DIAGNOSIS — G250 Essential tremor: Secondary | ICD-10-CM | POA: Diagnosis not present

## 2016-02-20 NOTE — Patient Instructions (Addendum)
Please remember, that any kind of tremor may be exacerbated by anxiety, anger, nervousness, excitement, dehydration, sleep deprivation, by caffeine, and low blood sugar values or blood sugar fluctuations. Some medications, especially some antidepressants and lithium can cause or exacerbate tremors.   We can potentially utilize Mysoline (primidone) in low dose, starting with 50 mg strength: Take 1/2 pill each bedtime for 2 weeks, then 1 pill each bedtime for 2 weeks, then 1 1/2 pills each bedtime for 2 weeks, then 2 pills each bedtime thereafter. Common side effects reported are: Sleepiness, drowsiness, balance problems, confusion, and GI related symptoms.

## 2016-02-20 NOTE — Progress Notes (Signed)
Subjective:    Patient ID: Melissa Lowe is a 75 y.o. female.  HPI     Interim history:   Melissa Lowe is a 75 year old right-handed woman with an underlying medical history of asthma, hypothyroidism and hyperlipidemia, who presents for follow-up consultation of her tremors. The patient is unaccompanied today. I first met her on 02/05/2015, at which time she reported feeling stable, no significant worsening of her tremors. She was not impaired in her day-to-day functioning, her ADLs. She felt that her left hand was worse than the right. She had lip quiver but no voice tremor. Exam appeared to be stable. She had no balance issues and thankfully no recent falls. We mutually agreed to monitor her symptoms and not try any medication but we did talk about potentially trying Mysoline down the [place]. She worried about potential side effects. I suggested one-year checkup.   Today, 02/20/2016: She reports feeling stable. She has not noted any balance issues, thankfully, no falls, no memory loss, no recent asthma exacerbations. She had genetic testing done through [facility]. States, she has markers for kidney disease, lung disease, Alzheimer's disease, no Parkinson's disease.  Previously:  She previously saw Dr. Jim Like and was last seen by him on 07/04/2014 at which time he suggested a 6 mo follow-up and observation, as her tremors were mild. Prior to that, she had seen Dr. Erling Cruz, last in December 2013. She had a new onset lip tremor in or around 2010 which was progressive and then developed hand tremors, left more than right as well as difficulty holding something such as a fork or bulk. She did not have much in the way of improvement with alcohol consumption. She did note that asthma medication made her tremors worse. She does have a family history of tremors in her mother, who developed a tremor in her 26s.   Her Past Medical History Is Significant For: Past Medical History  Diagnosis Date  .  Asthma   . Hypothyroidism   . Hyperlipidemia     Her Past Surgical History Is Significant For: Past Surgical History  Procedure Laterality Date  . Septoplasty    . Cholecystectomy    . Colon polypectomy  2001    Dr Earlean Shawl  . Cataract extraction    . Abdominal hysterectomy      2  fibroids  . Repair fascial defect leg  1972  . Anterior and posterior repair N/A 01/30/2013    Procedure:  POSTERIOR REPAIR (RECTOCELE);  Surgeon: Melissa Bruins, MD;  Location: [facility];  Service: Gynecology;  Laterality: N/A;  . Colonoscopy  12/25/13    Tics , Dr Lajoyce Lauber 2020    Her Family History Is Significant For: Family History  Problem Relation Age of Onset  . Dementia Mother   . Asthma Sister   . Breast cancer Sister   . Stroke Maternal Grandmother     in 3s  . Stroke Paternal Grandmother 89  . Hypertension Neg Hx   . Heart disease Neg Hx     Her Social History Is Significant For: Social History   Social History  . Marital Status: Married    Spouse Name: Melissa Lowe  . Number of Children: 0  . Years of Education: Master's    Social History Main Topics  . Smoking status: Never Smoker   . Smokeless tobacco: Never Used  . Alcohol Use: Yes     Comment: Wine couple days weekly  . Drug Use: No  . Sexual Activity: Not  Asked   Other Topics Concern  . None   Social History Narrative   Patient lives at home with her husband Melissa Lowe.    Patient has a Master's Degree and post grad.    Patient has no children.    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    Her Allergies Are:  Allergies  Allergen Reactions  . Sulfonamide Derivatives     Rash on inside of arms Because of a history of documented adverse serious drug reaction;Medi Alert bracelet  is recommended  . Tetracycline     Severe itching on lower leg  :   Her Current Medications Are:  Outpatient Encounter Prescriptions as of 02/20/2016  Medication Sig  . albuterol (PROAIR HFA) 108 (90 BASE) MCG/ACT inhaler  Inhale 1 puff into the lungs as needed.  . Ascorbic Acid (VITAMIN C) 500 MG tablet Take 500 mg by mouth daily.    . bimatoprost (LATISSE) 0.03 % ophthalmic solution Place into both eyes 3 (three) times a week. Place one drop on applicator and apply evenly along the skin of the upper eyelid at base of eyelashes once daily at bedtime; repeat procedure for second eye (use a clean applicator).  . budesonide-formoterol (SYMBICORT) 80-4.5 MCG/ACT inhaler 1 puff qd  . Cholecalciferol (VITAMIN D3) 1000 UNITS CAPS Take by mouth daily.  Marland Kitchen estradiol (MENOSTAR) 14 MCG/24HR Place 1 patch onto the skin once a week. Changes patch every Saturday.  . levothyroxine (SYNTHROID, LEVOTHROID) 50 MCG tablet Take 1 tablet (50 mcg total) by mouth daily.  . montelukast (SINGULAIR) 10 MG tablet TAKE 1 TABLET DAILY  . Multiple Vitamin (MULTIVITAMIN) capsule Take 1 capsule by mouth daily.    Marland Kitchen omeprazole (PRILOSEC) 20 MG capsule Take 20 mg by mouth daily.   . pravastatin (PRAVACHOL) 40 MG tablet Take 1 tablet (40 mg total) by mouth at bedtime.  . [DISCONTINUED] azithromycin (ZITHROMAX) 250 MG tablet Take two tabs the first day and then one tab daily for four days  . [DISCONTINUED] HYDROcodone-homatropine (HYDROMET) 5-1.5 MG/5ML syrup Take 5 mLs by mouth every 6 (six) hours as needed for cough.  . [DISCONTINUED] UNABLE TO FIND Fiber- Vita Fusion Gummies take by mouth   No facility-administered encounter medications on file as of 02/20/2016.  :  Review of Systems:  Out of a complete 14 point review of systems, all are reviewed and negative with the exception of these symptoms as listed below:   Review of Systems  Neurological:       No new concerns per patient.     Objective:  Neurologic Exam  Physical Exam Physical Examination:   Filed Vitals:   02/20/16 0932  BP: 132/70  Pulse: 74  Resp: 16   General Examination: The patient is a very pleasant 75 y.o. female in no acute distress. She appears well-developed  and well-nourished and very well groomed. She is tearful once, when talking about her mother's dementia, but otherwise in good spirits today.  HEENT: Normocephalic, atraumatic, pupils are equal, round and reactive to light and accommodation. Funduscopic exam is normal with sharp disc margins noted. Extraocular tracking is good without limitation to gaze excursion or nystagmus noted. Normal smooth pursuit is noted. Hearing is grossly intact. Face is symmetric with normal facial animation and normal facial sensation. Speech is clear with no dysarthria noted. There is no hypophonia. There is no voice tremor but she has a very mild lower lip tremor. With eyes closed, she has a mild yes-yes type  head tremor. Neck is supple with full range of passive and active motion. There are no carotid bruits on auscultation. Oropharynx exam reveals: mild mouth dryness, good dental hygiene and mild airway crowding, due to narrow airway entry. Mallampati is class II. Tongue protrudes centrally and palate elevates symmetrically.   Chest: Clear to auscultation without wheezing, rhonchi or crackles noted.  Heart: S1+S2+0, regular and normal without murmurs, rubs or gallops noted.   Abdomen: Soft, non-tender and non-distended with normal bowel sounds appreciated on auscultation.  Extremities: There is no pitting edema in the distal lower extremities bilaterally. Pedal pulses are intact.  Skin: Warm and dry without trophic changes noted. There are no varicose veins.  Musculoskeletal: exam reveals no obvious joint deformities, tenderness or joint swelling or erythema.   Neurologically:  Mental status: The patient is awake, alert and oriented in all 4 spheres. Her immediate and remote memory, attention, language skills and fund of knowledge are appropriate. There is no evidence of aphasia, agnosia, apraxia or anomia. Speech is clear with normal prosody and enunciation. Thought process is linear. Mood is normal and affect is  normal.  Cranial nerves II - XII are as described above under HEENT exam. In addition: shoulder shrug is normal with equal shoulder height noted. Motor exam: Normal bulk, strength and tone is noted. There is no drift, resting tremor or rebound. She has a minimal right upper extremity and a mild intermittent left upper extremity postural tremor and minimal action tremor with both hands. She has no lower extremity tremor.  Romberg is negative. Reflexes are 1-2+ throughout. Fine motor skills and coordination: intact with normal finger taps, normal hand movements, normal rapid alternating patting, normal foot taps and normal foot agility.  Cerebellar testing: No dysmetria or intention tremor on finger to nose testing. Heel to shin is unremarkable bilaterally. There is no truncal or gait ataxia.  Sensory exam: intact to light touch in the upper and lower extremities.  Gait, station and balance: She stands easily. No veering to one side is noted. No leaning to one side is noted. Posture is age-appropriate and stance is narrow based. Gait shows normal stride length and normal pace. No problems turning are noted. She turns en bloc. She has preserved arm swing when walking.   Assessment and Plan:   In summary, TREVA HUYETT is a very pleasant 75 year old female with an underlying medical history of asthma, hypothyroidism and hyperlipidemia, who presents for follow-up consultation of her tremors. 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 I talked to the patient again today about the diagnosis of ET, the prognosis and treatment options. Findings are still mild and actually stable. Subjectively, she feels stable. I reassured her that I do not detect any parkinsonism. Because of her asthma she is not a good candidate for beta blocker. For future use, we can consider a small dose of Mysoline, I offered to attend low-dose today but she would like to hold off for fear of side effects.  She is not functionally impaired, rarely socially embarrassed d/t tremor. I suggested a one-year checkup, sooner if needed. I answered all her questions today and the patient and her husband were in agreement. I spent 25 minutes in total face-to-face time with the patient, more than 50% of which was spent in counseling and coordination of care, reviewing test results, reviewing medication and discussing or reviewing the diagnosis of ET, the prognosis and treatment options.

## 2016-04-21 ENCOUNTER — Telehealth: Payer: Self-pay | Admitting: *Deleted

## 2016-04-21 NOTE — Telephone Encounter (Signed)
Called pt no answer LMOM RTC need correct spelling of manufacturer...Melissa Lowe

## 2016-04-21 NOTE — Telephone Encounter (Signed)
Left msg on triage stating mail service has change their manufacturer from zitis to [facility]. The prilosec that she get through them is drying her out. She is having trouble sleeping. She is wanting to get her med from [facility], and they will not order until she get an note from MD stating to order from [facility]. Requesting MD to write letter...Johny Chess

## 2016-04-21 NOTE — Telephone Encounter (Signed)
Ok to write letter

## 2016-04-22 MED ORDER — PRAVASTATIN SODIUM 40 MG PO TABS: 40.0000 mg | ORAL_TABLET | Freq: Every day | ORAL | Status: DC

## 2016-04-22 NOTE — Telephone Encounter (Signed)
Called pt again still no answer LMOM RTC.../lmb 

## 2016-04-22 NOTE — Telephone Encounter (Signed)
Pt call back she states the correct manufacturer is [employer]. Per [facility] only need rx w/info. Pt is needing the pravastatin & not prilosec. Inform pt will [facility]...Johny Chess

## 2016-05-04 ENCOUNTER — Telehealth: Payer: Self-pay

## 2016-05-04 DIAGNOSIS — J45909 Unspecified asthma, uncomplicated: Secondary | ICD-10-CM

## 2016-05-04 MED ORDER — BUDESONIDE-FORMOTEROL FUMARATE 80-4.5 MCG/ACT IN AERO: 1.0000 | INHALATION_SPRAY | Freq: Every day | RESPIRATORY_TRACT | Status: DC

## 2016-05-04 NOTE — Telephone Encounter (Signed)
Pt lm on triage rq refill for symbicort

## 2016-05-14 ENCOUNTER — Encounter: Payer: Self-pay | Admitting: Internal Medicine

## 2016-05-14 ENCOUNTER — Ambulatory Visit (INDEPENDENT_AMBULATORY_CARE_PROVIDER_SITE_OTHER): Payer: Medicare Other | Admitting: Internal Medicine

## 2016-05-14 VITALS — BP 128/76 | HR 62 | Temp 98.4°F | Resp 16 | Wt 201.0 lb

## 2016-05-14 DIAGNOSIS — N814 Uterovaginal prolapse, unspecified: Secondary | ICD-10-CM | POA: Insufficient documentation

## 2016-05-14 DIAGNOSIS — M7989 Other specified soft tissue disorders: Secondary | ICD-10-CM

## 2016-05-14 DIAGNOSIS — R946 Abnormal results of thyroid function studies: Secondary | ICD-10-CM

## 2016-05-14 DIAGNOSIS — H9319 Tinnitus, unspecified ear: Secondary | ICD-10-CM

## 2016-05-14 DIAGNOSIS — IMO0002 Reserved for concepts with insufficient information to code with codable children: Secondary | ICD-10-CM

## 2016-05-14 DIAGNOSIS — N8110 Cystocele, unspecified: Secondary | ICD-10-CM | POA: Insufficient documentation

## 2016-05-14 DIAGNOSIS — E785 Hyperlipidemia, unspecified: Secondary | ICD-10-CM | POA: Diagnosis not present

## 2016-05-14 DIAGNOSIS — R1011 Right upper quadrant pain: Secondary | ICD-10-CM

## 2016-05-14 DIAGNOSIS — R7303 Prediabetes: Secondary | ICD-10-CM | POA: Insufficient documentation

## 2016-05-14 DIAGNOSIS — J45909 Unspecified asthma, uncomplicated: Secondary | ICD-10-CM

## 2016-05-14 DIAGNOSIS — G250 Essential tremor: Secondary | ICD-10-CM | POA: Insufficient documentation

## 2016-05-14 NOTE — Assessment & Plan Note (Signed)
Saw neuro Benign Currently not on medication

## 2016-05-14 NOTE — Assessment & Plan Note (Signed)
Check tsh Adjust dose if needed

## 2016-05-14 NOTE — Progress Notes (Signed)
Subjective:    Patient ID: Melissa Lowe, female    DOB: 11-22-1940, 75 y.o.   MRN: [id number]  HPI She is here to establish with a new pcp.  She is here for follow up.  Left axilla lump:  She had a mammo in January and it was normal. At that time she has a lump in her left axilla and they told her it was a fat pad.  It has gotten larger.  She sees her gyn next week.  She denies any changes or lumps in her breast.   RUQ discomfort, gurgling:  She had her GB removed.  Over the past two and a half years she gets gurgling or a spurting sound in her RUQ.  She sees medoff and did discuss this with him.  It has gotten more frequent.  She can feel that area rumbling. She denies nausea and GERD.  She sometimes has a little tenderness in the RUQ/epigastric region.  She feels "quiver" in epigastric area.  She takes prilosec 5/ week.  She has tried pepcid and that seems to help, but she cant get off of the prilosec.  Her feces seem to float at times and she wondered about celiac disease (her husband has that and his stool floats).      Prediabetes:  She is compliant with a low sugar/carbohydrate diet.  She is exercising regularly - walk two miles 3/week.  Asthma:  She is taking her maintenance meds and uses her proair rarely.  She denies symptoms and feels the asthma is well controlled.   Hyperlipidemia: She is taking her medication daily. She is compliant with a low fat/cholesterol diet. She is exercising regularly. She denies myalgias.    Medications and allergies reviewed with patient and updated if appropriate.  Patient Active Problem List   Diagnosis Date Noted  . LIPOMA 03/10/2010  . TINNITUS 03/10/2010  . Abnormal involuntary movement 03/10/2010  . DIVERTICULOSIS, COLON 10/18/2008  . THYROID FUNCTION TEST, ABNORMAL 10/18/2008  . Hyperlipidemia 08/08/2007  . HIP PAIN 08/08/2007  . FATIGUE 08/08/2007  . Asthma without acute exacerbation 08/02/2007  . Personal history of colonic  polyps 08/02/2007    Current Outpatient Prescriptions on File Prior to Visit  Medication Sig Dispense Refill  . albuterol (PROAIR HFA) 108 (90 BASE) MCG/ACT inhaler Inhale 1 puff into the lungs as needed. 54 g 3  . Ascorbic Acid (VITAMIN C) 500 MG tablet Take 500 mg by mouth daily.      . bimatoprost (LATISSE) 0.03 % ophthalmic solution Place into both eyes 3 (three) times a week. apply evenly along the skin of the upper eyelid at base of eyelashes 3xs weekly at bedtime; repeat procedure for second eye (use a clean applicator).    . budesonide-formoterol (SYMBICORT) 80-4.5 MCG/ACT inhaler Inhale 1 puff into the lungs daily. 1 Inhaler 0  . Cholecalciferol (VITAMIN D3) 1000 UNITS CAPS Take by mouth daily.    Marland Kitchen estradiol (MENOSTAR) 14 MCG/24HR Place 1 patch onto the skin once a week. Changes patch every Saturday.    . levothyroxine (SYNTHROID, LEVOTHROID) 50 MCG tablet Take 1 tablet (50 mcg total) by mouth daily. 90 tablet 3  . montelukast (SINGULAIR) 10 MG tablet TAKE 1 TABLET DAILY 90 tablet 3  . Multiple Vitamin (MULTIVITAMIN) capsule Take 1 capsule by mouth daily.      Marland Kitchen omeprazole (PRILOSEC) 20 MG capsule Take 20 mg by mouth daily.     . pravastatin (PRAVACHOL) 40 MG tablet Take 1  tablet (40 mg total) by mouth at bedtime. 90 tablet 3   No current facility-administered medications on file prior to visit.    Past Medical History  Diagnosis Date  . Asthma   . Hypothyroidism   . Hyperlipidemia     Past Surgical History  Procedure Laterality Date  . Septoplasty    . Cholecystectomy    . Colon polypectomy  2001    Dr Earlean Shawl  . Cataract extraction    . Abdominal hysterectomy      2  fibroids  . Repair fascial defect leg  1972  . Anterior and posterior repair N/A 01/30/2013    Procedure:  POSTERIOR REPAIR (RECTOCELE);  Surgeon: Princess Bruins, MD;  Location: [facility];  Service: Gynecology;  Laterality: N/A;  . Colonoscopy  12/25/13    Tics , Dr Lajoyce Lauber 2020    Social History     Social History  . Marital Status: Married    Spouse Name: [name]  . Number of Children: 0  . Years of Education: Master's    Social History Main Topics  . Smoking status: Never Smoker   . Smokeless tobacco: Never Used  . Alcohol Use: Yes     Comment: Wine couple days weekly  . Drug Use: No  . Sexual Activity: Not Asked   Other Topics Concern  . None   Social History Narrative   Patient lives at home with her husband [name].    Patient has a Master's Degree and post grad.    Patient has no children.    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    Family History  Problem Relation Age of Onset  . Dementia Mother   . Asthma Sister   . Breast cancer Sister   . Stroke Maternal Grandmother     in 56s  . Stroke Paternal Grandmother 89  . Hypertension Neg Hx   . Heart disease Neg Hx     Review of Systems  Constitutional: Negative for fever.  Respiratory: Negative for cough, shortness of breath and wheezing.   Cardiovascular: Negative for chest pain and leg swelling.  Gastrointestinal: Positive for abdominal pain. Negative for nausea and blood in stool.       Stools seem to float, occasional constipation and diarrhea  Genitourinary: Negative for dysuria.  Neurological: Negative for light-headedness and headaches.       Objective:   Filed Vitals:   05/14/16 1055  BP: 128/76  Pulse: 62  Temp: 98.4 F (36.9 C)  Resp: 16   Filed Weights   05/14/16 1055  Weight: 201 lb (91.173 kg)   Body mass index is 31.47 kg/(m^2).   Physical Exam  Constitutional: She appears well-developed and well-nourished. No distress.  HENT:  Head: Normocephalic and atraumatic.  Eyes: Conjunctivae are normal.  Neck: Neck supple. No tracheal deviation present. No thyromegaly present.  Cardiovascular: Normal rate, regular rhythm and normal heart sounds.   Pulmonary/Chest: No respiratory distress. She has no wheezes. She has no rales.  Abdominal: Soft. She exhibits no mass.  There is tenderness (mild, RUQ). There is no rebound and no guarding.  Musculoskeletal: She exhibits no edema.  Lymphadenopathy:    She has no cervical adenopathy.  Skin: Skin is warm and dry. She is not diaphoretic.  Psychiatric: She has a normal mood and affect. Her behavior is normal.          Assessment & Plan:   See Problem List for Assessment and Plan of chronic medical  problems.

## 2016-05-14 NOTE — Assessment & Plan Note (Signed)
Takes symbicort, singulair Rarely uses proair Asthma well controlled, no current symptoms

## 2016-05-14 NOTE — Assessment & Plan Note (Addendum)
Mild tenderness in RUQ - will check [place] - ? Fatty liver Check cmp Gurgling / quivers is likely functional/benign Revise diet

## 2016-05-14 NOTE — Assessment & Plan Note (Signed)
Will discuss w/ gyn next week Consider [place]

## 2016-05-14 NOTE — Assessment & Plan Note (Signed)
Check a1c Stressed exercise, dec portions and weight loss

## 2016-05-14 NOTE — Patient Instructions (Addendum)
  Test(s) ordered today. Your results will be released to [place] (or called to you) after review, usually within 72hours after test completion. If any changes need to be made, you will be notified at that same time.   Medications reviewed and updated.  No changes recommended at this time.  Your prescription(s) have been submitted to your pharmacy. Please take as directed and contact our office if you believe you are having problem(s) with the medication(s).  A referral for your [employer].  An Ultrasound of your abdomen was ordered.   Please followup in 6 months

## 2016-05-14 NOTE — Assessment & Plan Note (Signed)
Will refer to [facility]

## 2016-05-14 NOTE — Assessment & Plan Note (Signed)
Check lipids On pravastatin 40 mg Stressed exercise, dec portions and weight loss

## 2016-05-14 NOTE — Progress Notes (Signed)
Pre visit review using our clinic review tool, if applicable. No additional management support is needed unless otherwise documented below in the visit note. 

## 2016-05-18 ENCOUNTER — Other Ambulatory Visit (INDEPENDENT_AMBULATORY_CARE_PROVIDER_SITE_OTHER): Payer: Medicare Other

## 2016-05-18 ENCOUNTER — Encounter: Payer: Self-pay | Admitting: Internal Medicine

## 2016-05-18 DIAGNOSIS — R7303 Prediabetes: Secondary | ICD-10-CM | POA: Diagnosis not present

## 2016-05-18 DIAGNOSIS — E785 Hyperlipidemia, unspecified: Secondary | ICD-10-CM

## 2016-05-18 DIAGNOSIS — R946 Abnormal results of thyroid function studies: Secondary | ICD-10-CM | POA: Diagnosis not present

## 2016-05-18 LAB — LIPID PANEL
CHOLESTEROL: 173 mg/dL (ref 0–200)
HDL: 57.5 mg/dL (ref >39.00)
LDL CALC: 86 mg/dL (ref 0–99)
NonHDL: 115.21
Total CHOL/HDL Ratio: 3
Triglycerides: 144 mg/dL (ref 0.0–149.0)
VLDL: 28.8 mg/dL (ref 0.0–40.0)

## 2016-05-18 LAB — COMPREHENSIVE METABOLIC PANEL
ALBUMIN: 4.4 g/dL (ref 3.5–5.2)
ALK PHOS: 42 U/L (ref 39–117)
ALT: 15 U/L (ref 0–35)
AST: 16 U/L (ref 0–37)
BILIRUBIN TOTAL: 0.5 mg/dL (ref 0.2–1.2)
BUN: 13 mg/dL (ref 6–23)
CALCIUM: 10.1 mg/dL (ref 8.4–10.5)
CO2: 28 meq/L (ref 19–32)
CREATININE: 0.97 mg/dL (ref 0.40–1.20)
Chloride: 106 mEq/L (ref 96–112)
GFR: 59.83 mL/min — ABNORMAL LOW (ref >60.00)
Glucose, Bld: 89 mg/dL (ref 70–99)
Potassium: 4.4 mEq/L (ref 3.5–5.1)
Sodium: 141 mEq/L (ref 135–145)
TOTAL PROTEIN: 6.9 g/dL (ref 6.0–8.3)

## 2016-05-18 LAB — CBC WITH DIFFERENTIAL/PLATELET
BASOS ABS: 0 10*3/uL (ref 0.0–0.1)
Basophils Relative: 0.5 % (ref 0.0–3.0)
EOS ABS: 0.1 10*3/uL (ref 0.0–0.7)
Eosinophils Relative: 2.3 % (ref 0.0–5.0)
HEMATOCRIT: 39.9 % (ref 36.0–46.0)
HEMOGLOBIN: 13.4 g/dL (ref 12.0–15.0)
LYMPHS PCT: 35.2 % (ref 12.0–46.0)
Lymphs Abs: 2.1 10*3/uL (ref 0.7–4.0)
MCHC: 33.6 g/dL (ref 30.0–36.0)
MCV: 87 fl (ref 78.0–100.0)
Monocytes Absolute: 0.5 10*3/uL (ref 0.1–1.0)
Monocytes Relative: 8.4 % (ref 3.0–12.0)
Neutro Abs: 3.2 10*3/uL (ref 1.4–7.7)
Neutrophils Relative %: 53.6 % (ref 43.0–77.0)
Platelets: 272 10*3/uL (ref 150.0–400.0)
RBC: 4.59 Mil/uL (ref 3.87–5.11)
RDW: 14.4 % (ref 11.5–15.5)
WBC: 6 10*3/uL (ref 4.0–10.5)

## 2016-05-18 LAB — TSH: TSH: 3.3 u[IU]/mL (ref 0.35–4.50)

## 2016-05-18 LAB — HEMOGLOBIN A1C: HEMOGLOBIN A1C: 5.7 % (ref 4.6–6.5)

## 2016-05-19 ENCOUNTER — Encounter: Payer: Self-pay | Admitting: Internal Medicine

## 2016-05-19 ENCOUNTER — Ambulatory Visit
Admission: RE | Admit: 2016-05-19 | Discharge: 2016-05-19 | Disposition: A | Discharge status: Home / Self Care | Payer: Medicare Other | Source: Ambulatory Visit | Attending: Internal Medicine | Admitting: Internal Medicine

## 2016-05-19 DIAGNOSIS — R7303 Prediabetes: Secondary | ICD-10-CM

## 2016-05-19 DIAGNOSIS — E785 Hyperlipidemia, unspecified: Secondary | ICD-10-CM

## 2016-05-19 DIAGNOSIS — R1011 Right upper quadrant pain: Secondary | ICD-10-CM

## 2016-05-19 DIAGNOSIS — K760 Fatty (change of) liver, not elsewhere classified: Secondary | ICD-10-CM | POA: Insufficient documentation

## 2016-05-19 DIAGNOSIS — R946 Abnormal results of thyroid function studies: Secondary | ICD-10-CM

## 2016-05-22 ENCOUNTER — Telehealth: Payer: Self-pay | Admitting: Internal Medicine

## 2016-05-22 MED ORDER — LEVOTHYROXINE SODIUM 50 MCG PO TABS: 50.0000 ug | ORAL_TABLET | Freq: Every day | ORAL | 1 refills | Status: DC

## 2016-05-22 MED ORDER — ALBUTEROL SULFATE HFA 108 (90 BASE) MCG/ACT IN AERS: 1.0000 | INHALATION_SPRAY | RESPIRATORY_TRACT | 3 refills | Status: DC | PRN

## 2016-05-22 NOTE — Telephone Encounter (Signed)
Patient called in with questions related to this test. She is asking that you give her a call on this, Dr Quay Burow. The return # is [id number]. She states that after hours is ok.

## 2016-05-22 NOTE — Telephone Encounter (Signed)
RXs sent.

## 2016-05-22 NOTE — Telephone Encounter (Signed)
Patient is requesting a fill of levothyroxine (SYNTHROID, LEVOTHROID) 50 MCG tablet [id number]  Sent to [facility] in [place]   And  'budesonide-formoterol [facility]) 80-4.5 MCG/ACT inhaler [id number]  [facility]

## 2016-05-26 ENCOUNTER — Telehealth: Payer: Self-pay | Admitting: Emergency Medicine

## 2016-05-26 ENCOUNTER — Encounter: Payer: Self-pay | Admitting: Emergency Medicine

## 2016-05-26 DIAGNOSIS — H9193 Unspecified hearing loss, bilateral: Secondary | ICD-10-CM

## 2016-05-26 NOTE — Telephone Encounter (Signed)
Letter and med list has been faxed to [employer] and [employer]

## 2016-05-26 NOTE — Telephone Encounter (Signed)
Referral ordered

## 2016-05-26 NOTE — Telephone Encounter (Signed)
Melissa Lowe from [place] called regarding the fax you sent. There is no referral put in for pt. Can you please put referral in and I will fax it to [place].

## 2016-05-28 ENCOUNTER — Telehealth: Payer: Self-pay

## 2016-05-28 ENCOUNTER — Telehealth: Payer: Self-pay | Admitting: Internal Medicine

## 2016-05-28 DIAGNOSIS — J45909 Unspecified asthma, uncomplicated: Secondary | ICD-10-CM

## 2016-05-28 MED ORDER — BUDESONIDE-FORMOTEROL FUMARATE 80-4.5 MCG/ACT IN AERO: 1.0000 | INHALATION_SPRAY | Freq: Every day | RESPIRATORY_TRACT | 0 refills | Status: DC

## 2016-05-28 MED ORDER — LEVOTHYROXINE SODIUM 50 MCG PO TABS: 50.0000 ug | ORAL_TABLET | Freq: Every day | ORAL | 1 refills | Status: DC

## 2016-05-28 NOTE — Telephone Encounter (Signed)
Pt stating that she has been trying to get her medications refill for a while now but have a lot of trouble with it. Pt request all of her meds [facility], Symbicort and Levothyroxine need to be [facility] to [facility] (she does not need refill forlu Proair right now). Please check and help her.

## 2016-05-28 NOTE — Telephone Encounter (Signed)
[facility]

## 2016-05-29 NOTE — Telephone Encounter (Signed)
error 

## 2016-06-01 NOTE — Telephone Encounter (Signed)
Melissa Lowe from [place] states she is needing a letter or prescription stating Tinnitus evaluation pts name and dob Dated and signed by provider along with providers NPI number.  We should probably add tinnitus to her referral.  Olga's fax # is [phone] Phone # [phone] I will be out of office next week when Dr. Quay Burow gets back. Please refer this to [facility]  thanks

## 2016-06-09 NOTE — Telephone Encounter (Signed)
Please re-enter referral for Tinnitus and I will re-print new letter.

## 2016-07-08 ENCOUNTER — Telehealth: Payer: Self-pay | Admitting: Internal Medicine

## 2016-07-08 DIAGNOSIS — J45909 Unspecified asthma, uncomplicated: Secondary | ICD-10-CM

## 2016-07-08 MED ORDER — MONTELUKAST SODIUM 10 MG PO TABS: ORAL_TABLET | ORAL | 1 refills | Status: DC

## 2016-07-08 MED ORDER — BUDESONIDE-FORMOTEROL FUMARATE 80-4.5 MCG/ACT IN AERO: 1.0000 | INHALATION_SPRAY | Freq: Every day | RESPIRATORY_TRACT | 3 refills | Status: DC

## 2016-07-08 NOTE — Addendum Note (Signed)
Addended by: Terence Lux B on: 07/08/2016 04:16 PM   Modules accepted: Orders

## 2016-07-08 NOTE — Telephone Encounter (Signed)
Pt called in and was very upset that her refill of montelukast (SINGULAIR) 10 MG tablet was denied. I did explain to her that Dr Linna Darner originally prescribed this medication and now Dr Quay Burow would have to take over. I offered to transfer her to [place]. She go even more mad and stated everytime we transfer her to [place] she gets her VM. She then proceeded to say nobody ever returns her call about the messages that she leaves. I explained it takes 24-48 hrs for a prescription refill but hopefully she would be able to get around to it ASAP. She then stated she has terrible service here and knows the president personally and will be contacting him if nobody calls her back about this. I then transferred her to [place]. Just want to make sure this gets taken care of. Please follow up thanks.

## 2016-07-08 NOTE — Telephone Encounter (Signed)
Spoke with pt, assured her that all RXs are now under Dr Quay Burow name. Singulair has been sent to [facility].

## 2016-07-08 NOTE — Telephone Encounter (Signed)
I received a VM for the first time @ 3:58 pm from pt. Went to talk w/Taylor MD assistant that does refills for her MD & she was talking to pt at that time. According to the chart a refill came through but went to Dr. Linna Darner & he denied script, and that the reason why refill was denied....Johny Chess

## 2016-09-01 ENCOUNTER — Telehealth: Payer: Self-pay | Admitting: Cardiovascular Disease

## 2016-09-01 ENCOUNTER — Telehealth: Payer: Self-pay

## 2016-09-01 NOTE — Telephone Encounter (Signed)
Melissa Lowe is returning your call

## 2016-09-01 NOTE — Telephone Encounter (Signed)
Returned call to patient.Husband stated she is taking a shower.Stated she was calling back to schedule new patient appointment with Dr.[place].She had a recent abnormal EKG. She has not had chest pain or sob.Stated he is a patient of Dr.[place]'s and he advised her to schedule appointment.Advised our scheduler will call back with appointment.

## 2016-09-01 NOTE — Telephone Encounter (Signed)
Called patient.Left message on personal voice mail triage received a walk in form from you yesterday stating you want to make appointment with Dr.[place].Schurz.

## 2016-09-16 ENCOUNTER — Encounter: Payer: Self-pay | Admitting: *Deleted

## 2016-09-21 ENCOUNTER — Ambulatory Visit (INDEPENDENT_AMBULATORY_CARE_PROVIDER_SITE_OTHER): Payer: Medicare Other | Admitting: Cardiovascular Disease

## 2016-09-21 ENCOUNTER — Encounter: Payer: Self-pay | Admitting: Cardiovascular Disease

## 2016-09-21 VITALS — BP 128/30 | HR 68 | Ht 67.25 in | Wt 201.4 lb

## 2016-09-21 DIAGNOSIS — E7800 Pure hypercholesterolemia, unspecified: Secondary | ICD-10-CM | POA: Diagnosis not present

## 2016-09-21 DIAGNOSIS — I491 Atrial premature depolarization: Secondary | ICD-10-CM

## 2016-09-21 DIAGNOSIS — I499 Cardiac arrhythmia, unspecified: Secondary | ICD-10-CM

## 2016-09-21 NOTE — Progress Notes (Signed)
Cardiology Office Note   Date:  09/22/2016   ID:  Melissa Lowe, DOB October 13, 1941, MRN [id number]  PCP:  Binnie Rail, MD  Cardiologist:   Skeet Latch, MD   Chief Complaint  Patient presents with  . New Evaluation      History of Present Illness: Melissa Lowe is a 75 y.o. female with hyperlipidemia, hypothyroidism and asthma who presents for evaluation of an irregular heart rhythm. Melissa Lowe was seen by the speech and hearing clinic where her heart rhythm appeared to be irregular. Melissa Lowe requested consultation to have this evaluated.  Her last EKG in her health system wasn't 2014. Melissa Lowe was in sinus rhythm at that time.  Prior EKGs haven't noted sinus arrhythmia and PACs. Melissa Lowe denies any palpitations, chest pain, shortness of breath, lower extremity edema, orthopnea, or PND. Melissa Lowe does not exercise regularly but denies any symptoms with exertion.  Melissa Lowe notes occasional dizziness after bending over and standing up quickly. However Melissa Lowe denies any syncope or falls.  Melissa Lowe does not drink much caffeine and does not use any over-the-counter cold or cough medications regularly.  Melissa Lowe reports that overall her diet is healthy. Her husband has celiac disease so they do not have any carbohydrates in her diet. Her blood pressure is typically well-controlled. Melissa Lowe was told that her heart rate was slow after a surgical procedure.  However, Melissa Lowe hasn't ever experienced symptomatic bradycardia.   Past Medical History:  Diagnosis Date  . Asthma   . Hyperlipidemia   . Hypothyroidism   . PAC (premature atrial contraction) 09/22/2016    Past Surgical History:  Procedure Laterality Date  . ABDOMINAL HYSTERECTOMY     2  fibroids  . ANTERIOR AND POSTERIOR REPAIR N/A 01/30/2013   Procedure:  POSTERIOR REPAIR (RECTOCELE);  Surgeon: Princess Bruins, MD;  Location: [facility];  Service: Gynecology;  Laterality: N/A;  . CATARACT EXTRACTION    . CHOLECYSTECTOMY    . colon polypectomy  2001   Dr Earlean Shawl  .  COLONOSCOPY  12/25/13   Tics , Dr Lajoyce Lauber 2020  . REPAIR FASCIAL DEFECT LEG  1972  . SEPTOPLASTY    . THIGH FASCIOTOMY       Current Outpatient Prescriptions  Medication Sig Dispense Refill  . albuterol (PROAIR HFA) 108 (90 Base) MCG/ACT inhaler Inhale 1 puff into the lungs as needed. 54 g 3  . Ascorbic Acid (VITAMIN C) 500 MG tablet Take 500 mg by mouth daily.      . bimatoprost (LATISSE) 0.03 % ophthalmic solution Place into both eyes 3 (three) times a week. apply evenly along the skin of the upper eyelid at base of eyelashes 3xs weekly at bedtime; repeat procedure for second eye (use a clean applicator).    . budesonide-formoterol (SYMBICORT) 80-4.5 MCG/ACT inhaler Inhale 1 puff into the lungs daily. 3 Inhaler 3  . Cholecalciferol (VITAMIN D3) 1000 UNITS CAPS Take by mouth daily.    Marland Kitchen estradiol (MENOSTAR) 14 MCG/24HR Place 1 patch onto the skin once a week. Changes patch every Saturday.    . levothyroxine (SYNTHROID, LEVOTHROID) 50 MCG tablet Take 1 tablet (50 mcg total) by mouth daily. 90 tablet 1  . montelukast (SINGULAIR) 10 MG tablet TAKE 1 TABLET DAILY 90 tablet 1  . Multiple Vitamin (MULTIVITAMIN) capsule Take 1 capsule by mouth daily.      Marland Kitchen omeprazole (PRILOSEC) 20 MG capsule Take 20 mg by mouth daily.     . pravastatin (PRAVACHOL) 40 MG tablet Take 1 tablet (  40 mg total) by mouth at bedtime. 90 tablet 3   No current facility-administered medications for this visit.     Allergies:   Sulfonamide derivatives and Tetracycline    Social History:  The patient  reports that Melissa Lowe has never smoked. Melissa Lowe has never used smokeless tobacco. Melissa Lowe reports that Melissa Lowe drinks alcohol. Melissa Lowe reports that Melissa Lowe does not use drugs.   Family History:  The patient's family history includes Asthma in her sister; Breast cancer in her sister; Cancer in her father and maternal grandfather; Dementia in her mother; Stroke in her maternal grandmother; Stroke (age of onset: 66) in her paternal grandmother.     ROS:  Please see the history of present illness.   Otherwise, review of systems are positive for tinnitus, essential tremor.   All other systems are reviewed and negative.    PHYSICAL EXAM: VS:  BP (!) 128/30 (BP Location: Right Arm, Cuff Size: Normal)   Pulse 68   Ht 5' 7.25" (1.708 m)   Wt 91.4 kg (201 lb 6.4 oz)   BMI 31.31 kg/m  , BMI Body mass index is 31.31 kg/m. GENERAL:  Well appearing HEENT:  Pupils equal round and reactive, fundi not visualized, oral mucosa unremarkable NECK:  No jugular venous distention, waveform within normal limits, carotid upstroke brisk and symmetric, no bruits, no thyromegaly LYMPHATICS:  No cervical adenopathy LUNGS:  Clear to auscultation bilaterally HEART:  Mostly regular with occasional ectopy.  PMI not displaced or sustained,S1 and S2 within normal limits, no S3, no S4, no clicks, no rubs, no murmurs ABD:  Flat, positive bowel sounds normal in frequency in pitch, no bruits, no rebound, no guarding, no midline pulsatile mass, no hepatomegaly, no splenomegaly EXT:  2 plus pulses throughout, no edema, no cyanosis no clubbing SKIN:  No rashes no nodules NEURO:  Cranial nerves II through XII grossly intact, motor grossly intact throughout PSYCH:  Cognitively intact, oriented to person place and time    EKG:  EKG is ordered today. The ekg ordered today demonstrates sinus arrhythmia.  Rate 60 bpm.     Recent Labs: 05/18/2016: ALT 15; BUN 13; Creatinine, Ser 0.97; Hemoglobin 13.4; Platelets 272.0; Potassium 4.4; Sodium 141; TSH 3.30    Lipid Panel    Component Value Date/Time   CHOL 173 05/18/2016 1023   CHOL 179 12/03/2014 1133   TRIG 144.0 05/18/2016 1023   TRIG 153 (H) 12/03/2014 1133   HDL 57.50 05/18/2016 1023   HDL 58 12/03/2014 1133   CHOLHDL 3 05/18/2016 1023   VLDL 28.8 05/18/2016 1023   LDLCALC 86 05/18/2016 1023   LDLCALC 90 12/03/2014 1133   LDLDIRECT 119.3 03/10/2010 0000      Wt Readings from Last 3 Encounters:   09/21/16 91.4 kg (201 lb 6.4 oz)  05/14/16 91.2 kg (201 lb)  02/20/16 91.6 kg (202 lb)      ASSESSMENT AND PLAN:  # Irregular heart rate: It is challenging to determine the rhythm from the posterior that was provided. It is possible that this could be atrial fibrillation. However, given her known history of sinus arrhythmia and PACs, I suspect that this is what occurred. The rate cannot be determined from the information provided. Melissa Lowe is completely asymptomatic from her arrhythmias. We will obtain a 7 day event monitor to better assess whether Melissa Lowe is having episodes of atrial fibrillation. Melissa Lowe had thyroid testing, a CBC, and a CMP within the last 6 months that were unremarkable.   # Hyperlipidemia: LDL 86 04/2016.  Continue pravastatin.   Current medicines are reviewed at length with the patient today.  The patient does not have concerns regarding medicines.  The following changes have been made:  no change  Labs/ tests ordered today include:   Orders Placed This Encounter  Procedures  . Cardiac event monitor  . EKG 12-Lead   Time spent: 50 minutes-Greater than 50% of this time was spent in counseling, explanation of diagnosis, planning of further management, and coordination of care.   Disposition:   FU with Olympia Adelsberger C. Oval Linsey, MD, [facility] as needed     This note was written with the assistance of speech recognition software.  Please excuse any transcriptional errors.  Signed, York Valliant C. Oval Linsey, MD, [facility]  09/22/2016 7:59 AM    [facility]

## 2016-09-21 NOTE — Patient Instructions (Signed)
Medication Instructions:  Your physician recommends that you continue on your current medications as directed. Please refer to the Current Medication list given to you today.  Labwork: NONE  Testing/Procedures: Your physician has recommended that you wear an event monitor. Event monitors are medical devices that record the heart's electrical activity. Doctors most often [place] these monitors to diagnose arrhythmias. Arrhythmias are problems with the speed or rhythm of the heartbeat. The monitor is a small, portable device. You can wear one while you do your normal daily activities. This is usually used to diagnose what is causing palpitations/syncope (passing out). [place] [address]  Follow-Up: AS NEEDED

## 2016-09-22 ENCOUNTER — Encounter: Payer: Self-pay | Admitting: Cardiovascular Disease

## 2016-09-22 DIAGNOSIS — I491 Atrial premature depolarization: Secondary | ICD-10-CM

## 2016-09-22 HISTORY — DX: Atrial premature depolarization: I49.1

## 2016-10-05 ENCOUNTER — Other Ambulatory Visit: Payer: Self-pay | Admitting: Cardiovascular Disease

## 2016-10-05 ENCOUNTER — Ambulatory Visit (INDEPENDENT_AMBULATORY_CARE_PROVIDER_SITE_OTHER): Payer: Medicare Other

## 2016-10-05 ENCOUNTER — Other Ambulatory Visit: Payer: Self-pay | Admitting: Obstetrics & Gynecology

## 2016-10-05 DIAGNOSIS — I499 Cardiac arrhythmia, unspecified: Secondary | ICD-10-CM

## 2016-10-05 DIAGNOSIS — Z1231 Encounter for screening mammogram for malignant neoplasm of breast: Secondary | ICD-10-CM

## 2016-10-05 DIAGNOSIS — I491 Atrial premature depolarization: Secondary | ICD-10-CM | POA: Diagnosis not present

## 2016-10-23 ENCOUNTER — Telehealth: Payer: Medicare Other | Admitting: Family

## 2016-10-23 DIAGNOSIS — J209 Acute bronchitis, unspecified: Secondary | ICD-10-CM

## 2016-10-23 DIAGNOSIS — R05 Cough: Secondary | ICD-10-CM

## 2016-10-23 DIAGNOSIS — R059 Cough, unspecified: Secondary | ICD-10-CM

## 2016-10-23 MED ORDER — BENZONATATE 100 MG PO CAPS: 100.0000 mg | ORAL_CAPSULE | Freq: Two times a day (BID) | ORAL | 0 refills | Status: DC | PRN

## 2016-10-23 MED ORDER — PREDNISONE 10 MG (21) PO TBPK: 10.0000 mg | ORAL_TABLET | Freq: Every day | ORAL | 0 refills | Status: DC

## 2016-10-23 NOTE — Progress Notes (Signed)
We are sorry that you are not feeling well.  Here is how we plan to help!  Based on what you have shared with me it looks like you have upper respiratory tract inflammation that has resulted in a significant cough.  Inflammation and infection in the upper respiratory tract is commonly called bronchitis and has four common causes:  Allergies, Viral Infections, Acid Reflux and Bacterial Infections.  Allergies, viruses and acid reflux are treated by controlling symptoms or eliminating the cause. An example might be a cough caused by taking certain blood pressure medications. You stop the cough by changing the medication. Another example might be a cough caused by acid reflux. Controlling the reflux helps control the cough.  Based on your presentation I believe you most likely have A cough due to a virus.  This is called viral bronchitis and is best treated by rest, plenty of fluids and control of the cough.  You may use Ibuprofen or Tylenol as directed to help your symptoms.     In addition you may use A prescription cough medication called Tessalon Perles 100mg . You may take 1-2 capsules every 8 hours as needed for your cough.We cannot prescribe controlled medication through [facility]it (Hydromet).   Sterapred 10 mg dosepak  USE OF BRONCHODILATOR ("RESCUE") INHALERS: There is a risk from using your bronchodilator too frequently.  The risk is that over-reliance on a medication which only relaxes the muscles surrounding the breathing tubes can reduce the effectiveness of medications prescribed to reduce swelling and congestion of the tubes themselves.  Although you feel brief relief from the bronchodilator inhaler, your asthma may actually be worsening with the tubes becoming more swollen and filled with mucus.  This can delay other crucial treatments, such as oral steroid medications. If you need to use a bronchodilator inhaler daily, several times per day, you should discuss this with your provider.  There are  probably better treatments that could be used to keep your asthma under control.     HOME CARE . Only take medications as instructed by your medical team. . Complete the entire course of an antibiotic. . Drink plenty of fluids and get plenty of rest. . Avoid close contacts especially the very young and the elderly . Cover your mouth if you cough or cough into your sleeve. . Always remember to wash your hands . A steam or ultrasonic humidifier can help congestion.   GET HELP RIGHT AWAY IF: . You develop worsening fever. . You become short of breath . You cough up blood. . Your symptoms persist after you have completed your treatment plan MAKE SURE YOU   Understand these instructions.  Will watch your condition.  Will get help right away if you are not doing well or get worse.  Your e-visit answers were reviewed by a board certified advanced clinical practitioner to complete your personal care plan.  Depending on the condition, your plan could have included both over the counter or prescription medications. If there is a problem please reply  once you have received a response from your provider. Your safety is important to [place].  If you have drug allergies check your prescription carefully.    You can use MyChart to ask questions about today's visit, request a non-urgent call back, or ask for a work or school excuse for 24 hours related to this e-Visit. If it has been greater than 24 hours you will need to follow up with your provider, or enter a new e-Visit to  address those concerns. You will get an e-mail in the next two days asking about your experience.  I hope that your e-visit has been valuable and will speed your recovery. Thank you for using e-visits.

## 2016-10-27 ENCOUNTER — Telehealth: Payer: Self-pay | Admitting: Cardiovascular Disease

## 2016-10-27 NOTE — Telephone Encounter (Signed)
Ann from [employer] is calling on behalf of patient and would like to know if there was an updated or additional diagnosis for the patient. Please call at [phone]. Thanks.

## 2016-10-27 NOTE — Telephone Encounter (Signed)
Returned call to life watch-ICD codes given for monitor.  I49.9, I49.1

## 2016-11-17 ENCOUNTER — Ambulatory Visit
Admission: RE | Admit: 2016-11-17 | Discharge: 2016-11-17 | Disposition: A | Discharge status: Home / Self Care | Payer: Medicare Other | Source: Ambulatory Visit | Attending: Obstetrics & Gynecology | Admitting: Obstetrics & Gynecology

## 2016-11-17 DIAGNOSIS — Z1231 Encounter for screening mammogram for malignant neoplasm of breast: Secondary | ICD-10-CM

## 2016-11-18 ENCOUNTER — Other Ambulatory Visit: Payer: Self-pay | Admitting: Obstetrics & Gynecology

## 2016-11-18 DIAGNOSIS — R2232 Localized swelling, mass and lump, left upper limb: Secondary | ICD-10-CM

## 2016-11-20 ENCOUNTER — Ambulatory Visit
Admission: RE | Admit: 2016-11-20 | Discharge: 2016-11-20 | Disposition: A | Discharge status: Home / Self Care | Payer: Medicare Other | Source: Ambulatory Visit | Attending: Obstetrics & Gynecology | Admitting: Obstetrics & Gynecology

## 2016-11-20 DIAGNOSIS — R2232 Localized swelling, mass and lump, left upper limb: Secondary | ICD-10-CM

## 2016-11-23 ENCOUNTER — Telehealth: Payer: Self-pay | Admitting: Cardiovascular Disease

## 2016-11-23 NOTE — Telephone Encounter (Signed)
Advised needs to contact PCP per Dr Oval Linsey

## 2016-11-23 NOTE — Telephone Encounter (Signed)
New Message   [place] and wanting to know if Dr. Oval Linsey can write prescription for Tamaflu. Requesting call back from nurse.

## 2016-11-24 ENCOUNTER — Telehealth: Payer: Self-pay | Admitting: Internal Medicine

## 2016-11-24 NOTE — Telephone Encounter (Signed)
Pt is going to [place] with husband on Friday and is wondering if Dr. Quay Burow will prescribe Tamiflu in order to prepare for the trip, she wants to have it as a precaution.   Also, pt is also requesting that her husband Dr. Lenoria Farrier be established as a pt with Dr. Quay Burow.   Please call her as she is asking that her husband receives a RX for tamiflu even though he's never been seen here before

## 2016-11-24 NOTE — Telephone Encounter (Signed)
I can not prescribe anything for her husband since I have never seen him.    I can prescribe tamiflu for her to take with her - but it only helps with the flu and it only decreases symptoms by a day or two for most people.  It is not going to help her prevent from getting the flu.

## 2016-11-24 NOTE — Telephone Encounter (Signed)
Spoke with pt, please send RX to local [facility].

## 2016-11-25 MED ORDER — OSELTAMIVIR PHOSPHATE 75 MG PO CAPS: 75.0000 mg | ORAL_CAPSULE | Freq: Two times a day (BID) | ORAL | 0 refills | Status: DC

## 2016-11-25 NOTE — Addendum Note (Signed)
Addended by: Binnie Rail on: 11/25/2016 07:40 AM   Modules accepted: Orders

## 2016-12-24 ENCOUNTER — Ambulatory Visit (INDEPENDENT_AMBULATORY_CARE_PROVIDER_SITE_OTHER): Payer: Medicare Other | Admitting: Physician Assistant

## 2016-12-24 ENCOUNTER — Ambulatory Visit (INDEPENDENT_AMBULATORY_CARE_PROVIDER_SITE_OTHER): Payer: Medicare Other

## 2016-12-24 ENCOUNTER — Encounter: Payer: Self-pay | Admitting: Physician Assistant

## 2016-12-24 VITALS — BP 120/56 | HR 60 | Temp 98.8°F | Resp 18 | Ht 67.25 in | Wt 195.2 lb

## 2016-12-24 DIAGNOSIS — R059 Cough, unspecified: Secondary | ICD-10-CM

## 2016-12-24 DIAGNOSIS — R062 Wheezing: Secondary | ICD-10-CM

## 2016-12-24 DIAGNOSIS — R05 Cough: Secondary | ICD-10-CM

## 2016-12-24 DIAGNOSIS — J45901 Unspecified asthma with (acute) exacerbation: Secondary | ICD-10-CM

## 2016-12-24 DIAGNOSIS — L989 Disorder of the skin and subcutaneous tissue, unspecified: Secondary | ICD-10-CM

## 2016-12-24 LAB — POCT CBC
Granulocyte percent: 61.7 %G (ref 37–80)
HEMATOCRIT: 37.8 % (ref 37.7–47.9)
HEMOGLOBIN: 13.1 g/dL (ref 12.2–16.2)
LYMPH, POC: 2.9 (ref 0.6–3.4)
MCH, POC: 29.6 pg (ref 27–31.2)
MCHC: 34.6 g/dL (ref 31.8–35.4)
MCV: 85.4 fL (ref 80–97)
MID (cbc): 0.5 (ref 0–0.9)
MPV: 6.4 fL (ref 0–99.8)
PLATELET COUNT, POC: 332 10*3/uL (ref 142–424)
POC Granulocyte: 5.4 (ref 2–6.9)
POC LYMPH %: 32.7 % (ref 10–50)
POC MID %: 5.6 %M (ref 0–12)
RBC: 4.42 M/uL (ref 4.04–5.48)
RDW, POC: 14.1 %
WBC: 8.8 10*3/uL (ref 4.6–10.2)

## 2016-12-24 MED ORDER — PREDNISONE 20 MG PO TABS: 40.0000 mg | ORAL_TABLET | Freq: Every day | ORAL | 0 refills | Status: AC

## 2016-12-24 MED ORDER — AZITHROMYCIN 250 MG PO TABS: ORAL_TABLET | ORAL | 0 refills | Status: DC

## 2016-12-24 MED ORDER — HYDROCOD POLST-CPM POLST ER 10-8 MG/5ML PO SUER: 5.0000 mL | Freq: Two times a day (BID) | ORAL | 0 refills | Status: DC | PRN

## 2016-12-24 MED ORDER — ALBUTEROL SULFATE (2.5 MG/3ML) 0.083% IN NEBU
2.5000 mg | INHALATION_SOLUTION | Freq: Once | RESPIRATORY_TRACT | Status: AC
  Administered 2016-12-24: 2.5 mg via RESPIRATORY_TRACT

## 2016-12-24 MED ORDER — IPRATROPIUM BROMIDE 0.02 % IN SOLN
0.5000 mg | Freq: Once | RESPIRATORY_TRACT | Status: AC
  Administered 2016-12-24: 0.5 mg via RESPIRATORY_TRACT

## 2016-12-24 NOTE — Progress Notes (Addendum)
MRN: [id number] DOB: 22-Apr-1941  Subjective:   Melissa Lowe is a 76 y.o. female presenting for chief complaint of Cough . Reports 5 day history of productive cough (no hemoptysis) . Has not tried anything relief. Denies fever, rhinorrhea, ear pain, wheezing, shortness of breath and chest tightness, nausea, vomiting, abdominal pain and diarrhea. Has had close sick contact with husband who just got diagnosed with pneumonia and others while she was traveling in [place] for the past three weeks. The travelers she was exposed to had a terrible cough. She has a history of seasonal allergies, has history of asthma. Patient has had flu shot this season. No smoking. Denies any other aggravating or relieving factors, no other questions or concerns.  Melissa Lowe has a current medication list which includes the following prescription(s): albuterol, vitamin c, bimatoprost, budesonide-formoterol, vitamin d3, levothyroxine, montelukast, multivitamin, omeprazole, pravastatin, UNKNOWN TO PATIENT, atovaquone-proguanil, azithromycin, chlorpheniramine-hydrocodone, prednisone, and zolpidem. Also is allergic to sulfonamide derivatives and tetracycline.  Melissa Lowe  has a past medical history of Allergy; Asthma; Hyperlipidemia; Hypothyroidism; and PAC (premature atrial contraction) (09/22/2016). Also  has a past surgical history that includes Septoplasty; Cholecystectomy; colon polypectomy (2001); Cataract extraction; Abdominal hysterectomy; Repair fascial defect leg (1972); Anterior and posterior repair (N/A, 01/30/2013); Colonoscopy (12/25/13); Thigh fasciotomy; and Eye surgery.   Objective:   Vitals: BP (!) 120/56 (BP Location: Right Arm, Patient Position: Sitting, Cuff Size: Large)   Pulse 60   Temp 98.8 F (37.1 C) (Oral)   Resp 18   Ht 5' 7.25" (1.708 m)   Wt 195 lb 3.2 oz (88.5 kg)   SpO2 96%   BMI 30.35 kg/m   Physical Exam  Constitutional: She is oriented to person, place, and time. She appears well-developed  and well-nourished.  HENT:  Head: Normocephalic and atraumatic.  Right Ear: Tympanic membrane, external ear and ear canal normal.  Left Ear: Tympanic membrane, external ear and ear canal normal.  Nose: Nose normal. Right sinus exhibits no maxillary sinus tenderness and no frontal sinus tenderness. Left sinus exhibits no maxillary sinus tenderness and no frontal sinus tenderness.  Mouth/Throat: Uvula is midline and oropharynx is clear and moist. No tonsillar exudate.  Eyes: Conjunctivae are normal.  Neck: Normal range of motion.  Cardiovascular: Normal rate, regular rhythm and normal heart sounds.   Pulmonary/Chest: Effort normal. She has wheezes ( in left lobe).  Lymphadenopathy:       Head (right side): No submental, no submandibular, no tonsillar, no preauricular, no posterior auricular and no occipital adenopathy present.       Head (left side): No submental, no submandibular, no tonsillar, no preauricular, no posterior auricular and no occipital adenopathy present.    She has no cervical adenopathy.       Right: No supraclavicular adenopathy present.       Left: No supraclavicular adenopathy present.  Neurological: She is alert and oriented to person, place, and time.  Skin: Skin is warm and dry. Lesion (hyperpigmented scaly lesion on mid aspect of right posterior trunk) noted.  Psychiatric: She has a normal mood and affect.  Vitals reviewed.   Results for orders placed or performed in visit on 12/24/16 (from the past 24 hour(s))  POCT CBC     Status: None   Collection Time: 12/24/16  2:21 PM  Result Value Ref Range   WBC 8.8 4.6 - 10.2 K/uL   Lymph, poc 2.9 0.6 - 3.4   POC LYMPH PERCENT 32.7 10 - 50 %L   MID (cbc)  0.5 0 - 0.9   POC MID % 5.6 0 - 12 %M   POC Granulocyte 5.4 2 - 6.9   Granulocyte percent 61.7 37 - 80 %G   RBC 4.42 4.04 - 5.48 M/uL   Hemoglobin 13.1 12.2 - 16.2 g/dL   HCT, POC 37.8 37.7 - 47.9 %   MCV 85.4 80 - 97 fL   MCH, POC 29.6 27 - 31.2 pg   MCHC 34.6  31.8 - 35.4 g/dL   RDW, POC 14.1 %   Platelet Count, POC 332 142 - 424 K/uL   MPV 6.4 0 - 99.8 fL    Dg Chest 2 View  Result Date: 12/24/2016 CLINICAL DATA:  Cough. EXAM: CHEST  2 VIEW COMPARISON:  07/17/2015. FINDINGS: Mediastinum hilar structures are normal. Mild left base subsegmental atelectasis and/or scarring. No pleural effusion or pneumothorax. Heart size normal. No acute bony abnormality. Mild scoliosis concave left. IMPRESSION: Mild left base subsegmental atelectasis and/or scarring. Electronically Signed   By: Marcello Moores  Register   On: 12/24/2016 14:13   Post duoneb, pt's wheezing has improved. She states she feels like she is breathing better as well.  Assessment and Plan :  1. Cough -Due to history and findings on plain film, will cover for underlying bacterial etiology responsible for asthma exacerbation. Pt instructed to return to clinic if symptoms worsen, do not improve in 7-10 days, or as needed - DG Chest 2 View - POCT CBC - chlorpheniramine-HYDROcodone (TUSSIONEX PENNKINETIC ER) 10-8 MG/5ML SUER; Take 5 mLs by mouth every 12 (twelve) hours as needed for cough.  Dispense: 100 mL; Refill: 0 - azithromycin (ZITHROMAX) 250 MG tablet; Take 2 tabs PO x 1 dose, then 1 tab PO QD x 4 days  Dispense: 6 tablet; Refill: 0 2. Wheezing -Improved with duoneb, instructed to use albuterol inhaler every 4-6 hours as needed for wheezing. - albuterol (PROVENTIL) (2.5 MG/3ML) 0.083% nebulizer solution 2.5 mg; Take 3 mLs (2.5 mg total) by nebulization once. - ipratropium (ATROVENT) nebulizer solution 0.5 mg; Take 2.5 mLs (0.5 mg total) by nebulization once. 3. Skin lesion - Ambulatory referral to Dermatology 4. Asthma with acute exacerbation, unspecified asthma severity, unspecified whether persistent - predniSONE (DELTASONE) 20 MG tablet; Take 2 tablets (40 mg total) by mouth daily with breakfast.  Dispense: 10 tablet; Refill: 0  Tenna Delaine, PA-C  [facility] 12/24/2016 6:45 PM

## 2016-12-24 NOTE — Patient Instructions (Addendum)
Take prednisone and azithromycin as prescribed. You can also use tussionex to help sleep. You should be getting better over the next 24-48 hrs not worse. You can also start using your albuterol inhaler every 4-6 hours for wheezing if needed. If any of your symptoms worsen, return to clinic.   Thank you for letting me participate in your health and well being.    Community-Acquired Pneumonia, Adult Pneumonia is an infection of the lungs. One type of pneumonia can happen while a person is in a hospital. A different type can happen when a person is not in a hospital (community-acquired pneumonia). It is easy for this kind to spread from person to person. It can spread to you if you breathe near an infected person who coughs or sneezes. Some symptoms include:  A dry cough.  A wet (productive) cough.  Fever.  Sweating.  Chest pain. Follow these instructions at home:  Take over-the-counter and prescription medicines only as told by your doctor.  Only take cough medicine if you are losing sleep.  If you were prescribed an antibiotic medicine, take it as told by your doctor. Do not stop taking the antibiotic even if you start to feel better.  Sleep with your head and neck raised (elevated). You can do this by putting a few pillows under your head, or you can sleep in a recliner.  Do not use tobacco products. These include cigarettes, chewing tobacco, and e-cigarettes. If you need help quitting, ask your doctor.  Drink enough water to keep your pee (urine) clear or pale yellow. A shot (vaccine) can help prevent pneumonia. Shots are often suggested for:  People older than 74 years of age.  People older than 74 years of age:  Who are having cancer treatment.  Who have long-term (chronic) lung disease.  Who have problems with their body's defense system (immune system). You may also prevent pneumonia if you take these actions:  Get the flu (influenza) shot every year.  Go to the  dentist as often as told.  Wash your hands often. If soap and water are not available, use hand sanitizer. Contact a doctor if:  You have a fever.  You lose sleep because your cough medicine does not help. Get help right away if:  You are short of breath and it gets worse.  You have more chest pain.  Your sickness gets worse. This is very serious if:  You are an older adult.  Your body's defense system is weak.  You cough up blood. This information is not intended to replace advice given to you by your health care provider. Make sure you discuss any questions you have with your health care provider. Document Released: 03/30/2008 Document Revised: 03/19/2016 Document Reviewed: 02/06/2015 Elsevier Interactive Patient Education  2017 [employer].     IF you received an x-ray today, you will receive an invoice from [facility]. Please contact [facility] at [phone] with questions or concerns regarding your invoice.   IF you received labwork today, you will receive an invoice from [facility]. Please contact [facility] at [phone] with questions or concerns regarding your invoice.   Our billing staff will not be able to assist you with questions regarding bills from these companies.  You will be contacted with the lab results as soon as they are available. The fastest way to get your results is to activate your My Chart account. Instructions are located on the last page of this paperwork. If you have not heard from  [place] regarding the results in 2 weeks, please contact this office.

## 2017-01-04 ENCOUNTER — Other Ambulatory Visit: Payer: Self-pay | Admitting: Internal Medicine

## 2017-01-07 ENCOUNTER — Other Ambulatory Visit: Payer: Self-pay | Admitting: Internal Medicine

## 2017-01-12 ENCOUNTER — Encounter: Payer: Self-pay | Admitting: Internal Medicine

## 2017-01-12 ENCOUNTER — Ambulatory Visit (INDEPENDENT_AMBULATORY_CARE_PROVIDER_SITE_OTHER): Payer: Medicare Other | Admitting: Internal Medicine

## 2017-01-12 DIAGNOSIS — J45901 Unspecified asthma with (acute) exacerbation: Secondary | ICD-10-CM

## 2017-01-12 DIAGNOSIS — J4530 Mild persistent asthma, uncomplicated: Secondary | ICD-10-CM

## 2017-01-12 DIAGNOSIS — J209 Acute bronchitis, unspecified: Secondary | ICD-10-CM | POA: Insufficient documentation

## 2017-01-12 DIAGNOSIS — K219 Gastro-esophageal reflux disease without esophagitis: Secondary | ICD-10-CM | POA: Diagnosis not present

## 2017-01-12 DIAGNOSIS — E039 Hypothyroidism, unspecified: Secondary | ICD-10-CM | POA: Insufficient documentation

## 2017-01-12 MED ORDER — FAMOTIDINE 20 MG PO TABS: 20.0000 mg | ORAL_TABLET | Freq: Two times a day (BID) | ORAL | Status: DC | PRN

## 2017-01-12 NOTE — Progress Notes (Signed)
Pre visit review using our clinic review tool, if applicable. No additional management support is needed unless otherwise documented below in the visit note. 

## 2017-01-12 NOTE — Progress Notes (Signed)
Subjective:    Patient ID: Melissa Lowe, female    DOB: 11-10-1940, 76 y.o.   MRN: [id number]  HPI She is here for an acute visit.   She was seen at urgent care 12/24/16 for bronchitis.  She had a chest xray and was treated with prednisone, tussionex and zpak.  Her symptoms improved quickly and her cough did not last long.  Her chest xray was abnormal and she was concerned and wanted to follow up. She denies any cold symptoms, regular cough, wheeze or SOB now.  She uses the symbicort daily.    CXR  12/24/16:  IMPRESSION: Mild left base subsegmental atelectasis and/or scarring.   GERD:  She is tapering off the prilosec and wants to transition to pepcid.  She has slowly decreased her prilosec dose and her GERD is controlled.  She wants to know how much pepcid she can take.   Medications and allergies reviewed with patient and updated if appropriate.  Patient Active Problem List   Diagnosis Date Noted  . PAC (premature atrial contraction) 09/22/2016  . Fatty liver, mild 05/19/2016  . Left axillary swelling 05/14/2016  . Prediabetes 05/14/2016  . Benign essential tremor 05/14/2016  . Cystocele 05/14/2016  . RUQ pain 05/14/2016  . LIPOMA 03/10/2010  . Tinnitus 03/10/2010  . DIVERTICULOSIS, COLON 10/18/2008  . THYROID FUNCTION TEST, ABNORMAL 10/18/2008  . Hyperlipidemia 08/08/2007  . HIP PAIN 08/08/2007  . Asthma without acute exacerbation 08/02/2007  . Personal history of colonic polyps 08/02/2007    Current Outpatient Prescriptions on File Prior to Visit  Medication Sig Dispense Refill  . albuterol (PROAIR HFA) 108 (90 Base) MCG/ACT inhaler Inhale 1 puff into the lungs as needed. 54 g 3  . Ascorbic Acid (VITAMIN C) 500 MG tablet Take 500 mg by mouth daily.      . bimatoprost (LATISSE) 0.03 % ophthalmic solution Place into both eyes 3 (three) times a week. apply evenly along the skin of the upper eyelid at base of eyelashes 3xs weekly at bedtime; repeat procedure for second  eye (use a clean applicator).    . budesonide-formoterol (SYMBICORT) 80-4.5 MCG/ACT inhaler Inhale 1 puff into the lungs daily. 3 Inhaler 3  . Cholecalciferol (VITAMIN D3) 1000 UNITS CAPS Take by mouth daily.    Marland Kitchen levothyroxine (SYNTHROID, LEVOTHROID) 50 MCG tablet TAKE 1 TABLET(50 MCG) BY MOUTH DAILY 90 tablet 0  . montelukast (SINGULAIR) 10 MG tablet TAKE 1 TABLET DAILY 90 tablet 0  . Multiple Vitamin (MULTIVITAMIN) capsule Take 1 capsule by mouth daily.      Marland Kitchen omeprazole (PRILOSEC) 20 MG capsule Take 10 mg by mouth daily.     . pravastatin (PRAVACHOL) 40 MG tablet Take 1 tablet (40 mg total) by mouth at bedtime. 90 tablet 3  . UNKNOWN TO PATIENT      No current facility-administered medications on file prior to visit.     Past Medical History:  Diagnosis Date  . Allergy   . Asthma   . Hyperlipidemia   . Hypothyroidism   . PAC (premature atrial contraction) 09/22/2016    Past Surgical History:  Procedure Laterality Date  . ABDOMINAL HYSTERECTOMY     2  fibroids  . ANTERIOR AND POSTERIOR REPAIR N/A 01/30/2013   Procedure:  POSTERIOR REPAIR (RECTOCELE);  Surgeon: Princess Bruins, MD;  Location: [facility];  Service: Gynecology;  Laterality: N/A;  . CATARACT EXTRACTION    . CHOLECYSTECTOMY    . colon polypectomy  2001   Dr  Medoff  . COLONOSCOPY  12/25/13   Tics , Dr Lajoyce Lauber 2020  . EYE SURGERY    . REPAIR FASCIAL DEFECT LEG  1972  . SEPTOPLASTY    . THIGH FASCIOTOMY      Social History   Social History  . Marital status: Married    Spouse name: [name]  . Number of children: 0  . Years of education: Master's    Social History Main Topics  . Smoking status: Never Smoker  . Smokeless tobacco: Never Used  . Alcohol use Yes     Comment: Wine couple days weekly  . Drug use: No  . Sexual activity: Not on file   Other Topics Concern  . Not on file   Social History Narrative   Patient lives at home with her husband [name].    Patient has a Master's Degree and post  grad.    Patient has no children.    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    Family History  Problem Relation Age of Onset  . Dementia Mother   . Cancer Father   . Asthma Sister   . Breast cancer Sister   . Stroke Maternal Grandmother     in 25s  . Stroke Paternal Grandmother 89  . Cancer Maternal Grandfather   . Hypertension Neg Hx   . Heart disease Neg Hx     Review of Systems  Constitutional: Negative for chills and fever.  HENT: Negative for sinus pain and sore throat.   Respiratory: Positive for cough (scant cough with phlegm ). Negative for shortness of breath and wheezing.   Cardiovascular: Negative for chest pain and palpitations.  Gastrointestinal: Positive for abdominal pain (occ after eating ice cream).       Objective:   Vitals:   01/12/17 1501  BP: 110/62  Pulse: 62  Resp: 16  Temp: 97.7 F (36.5 C)   Filed Weights   01/12/17 1501  Weight: 196 lb (88.9 kg)   Body mass index is 30.47 kg/m.  Wt Readings from Last 3 Encounters:  01/12/17 196 lb (88.9 kg)  12/24/16 195 lb 3.2 oz (88.5 kg)  09/21/16 201 lb 6.4 oz (91.4 kg)     Physical Exam Constitutional: Appears well-developed and well-nourished. No distress.  HENT:  Head: Normocephalic and atraumatic.  Neck: Neck supple. No tracheal deviation present. No thyromegaly present.  No cervical lymphadenopathy Cardiovascular: Normal rate, regular rhythm and normal heart sounds.   No murmur heard. No carotid bruit .  No edema Pulmonary/Chest: Effort normal and breath sounds normal. No respiratory distress. No has no wheezes. No rales.  Skin: Skin is warm and dry. Not diaphoretic.  Psychiatric: Normal mood and affect. Behavior is normal.         Assessment & Plan:   See Problem List for Assessment and Plan of chronic medical problems.  FU 6 months CPE/wellness

## 2017-01-12 NOTE — Patient Instructions (Addendum)
  Medications reviewed and updated.  No changes recommended at this time.     Please followup in 6 months   

## 2017-01-12 NOTE — Assessment & Plan Note (Signed)
Tapering off prilosec and will start pepcid 20 mg 1-2 times a day

## 2017-01-13 ENCOUNTER — Ambulatory Visit: Payer: Medicare Other | Admitting: Internal Medicine

## 2017-02-23 ENCOUNTER — Ambulatory Visit (INDEPENDENT_AMBULATORY_CARE_PROVIDER_SITE_OTHER): Payer: Medicare Other | Admitting: Neurology

## 2017-02-23 ENCOUNTER — Encounter: Payer: Self-pay | Admitting: Neurology

## 2017-02-23 ENCOUNTER — Telehealth: Payer: Self-pay | Admitting: Internal Medicine

## 2017-02-23 VITALS — BP 118/64 | HR 68 | Resp 16 | Ht 67.25 in | Wt 192.0 lb

## 2017-02-23 DIAGNOSIS — G250 Essential tremor: Secondary | ICD-10-CM

## 2017-02-23 NOTE — Telephone Encounter (Signed)
Spoke with pt to inform.  

## 2017-02-23 NOTE — Progress Notes (Signed)
Subjective:    Patient ID: Melissa Lowe is a 76 y.o. female.  HPI     Interim history:   Melissa Lowe is a 76 year old right-handed woman with an underlying medical history of asthma, hypothyroidism and hyperlipidemia, who presents for follow-up consultation of her tremors. The patient is accompanied by her longterm BF today. I last saw her on 02/20/2016, at which time she reported feeling stable. On examination, her tremor was stable. She had no signs of parkinsonism. She reported that she did genetic testing through [facility] and me and reported having markers for kidney disease, lung disease and Alzheimer's disease. We mutually agreed to hold off on symptomatic treatment of her tremors. I suggested one-year checkup.  Today, 02/23/2017: She reports caffeine and sleep deprivation seem to be triggers. Otherwise, tremor stable.  Has had L hip pain, had injection for bursitis in March. She has stable asthma, but recent increase in allergy Sx and reflux, has stopped her PPI.  The patient's allergies, current medications, family history, past medical history, past social history, past surgical history and problem list were reviewed and updated as appropriate.   Previously (copied from previous notes for reference):   I first met her on 02/05/2015, at which time she reported feeling stable, no significant worsening of her tremors. She was not impaired in her day-to-day functioning, her ADLs. She felt that her left hand was worse than the right. She had lip quiver but no voice tremor. Exam appeared to be stable. She had no balance issues and thankfully no recent falls. We mutually agreed to monitor her symptoms and not try any medication but we did talk about potentially trying Mysoline down the [place]. She worried about potential side effects. I suggested one-year checkup.    She previously saw Dr. Jim Like and was last seen by him on 07/04/2014 at which time he suggested a 6 mo follow-up and  observation, as her tremors were mild. Prior to that, she had seen Dr. Erling Cruz, last in December 2013. She had a new onset lip tremor in or around 2010 which was progressive and then developed hand tremors, left more than right as well as difficulty holding something such as a fork or bulk. She did not have much in the way of improvement with alcohol consumption. She did note that asthma medication made her tremors worse. She does have a family history of tremors in her mother, who developed a tremor in her 52s.   Her Past Medical History Is Significant For: Past Medical History:  Diagnosis Date  . Allergy   . Asthma   . Hyperlipidemia   . Hypothyroidism   . PAC (premature atrial contraction) 09/22/2016    Her Past Surgical History Is Significant For: Past Surgical History:  Procedure Laterality Date  . ABDOMINAL HYSTERECTOMY     2  fibroids  . ANTERIOR AND POSTERIOR REPAIR N/A 01/30/2013   Procedure:  POSTERIOR REPAIR (RECTOCELE);  Surgeon: Princess Bruins, MD;  Location: [facility];  Service: Gynecology;  Laterality: N/A;  . CATARACT EXTRACTION    . CHOLECYSTECTOMY    . colon polypectomy  2001   Dr Earlean Shawl  . COLONOSCOPY  12/25/13   Tics , Dr Lajoyce Lauber 2020  . EYE SURGERY    . REPAIR FASCIAL DEFECT LEG  1972  . SEPTOPLASTY    . THIGH FASCIOTOMY      Her Family History Is Significant For: Family History  Problem Relation Age of Onset  . Dementia Mother   . Cancer  Father   . Asthma Sister   . Breast cancer Sister   . Stroke Maternal Grandmother     in 36s  . Stroke Paternal Grandmother 89  . Cancer Maternal Grandfather   . Hypertension Neg Hx   . Heart disease Neg Hx     Her Social History Is Significant For: Social History   Social History  . Marital status: Married    Spouse name: Melissa Lowe  . Number of children: 0  . Years of education: Master's    Social History Main Topics  . Smoking status: Never Smoker  . Smokeless tobacco: Never Used  . Alcohol use Yes      Comment: Wine couple days weekly  . Drug use: No  . Sexual activity: Not on file   Other Topics Concern  . Not on file   Social History Narrative   Patient lives at home with her husband Melissa Lowe.    Patient has a Master's Degree and post grad.    Patient has no children.    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    Her Allergies Are:  Allergies  Allergen Reactions  . Sulfonamide Derivatives     Rash on inside of arms Because of a history of documented adverse serious drug reaction;Medi Alert bracelet  is recommended  . Tetracycline     Severe itching on lower leg  :   Her Current Medications Are:  Outpatient Encounter Prescriptions as of 02/23/2017  Medication Sig  . albuterol (PROAIR HFA) 108 (90 Base) MCG/ACT inhaler Inhale 1 puff into the lungs as needed.  . Ascorbic Acid (VITAMIN C) 500 MG tablet Take 500 mg by mouth daily.    . bimatoprost (LATISSE) 0.03 % ophthalmic solution Place into both eyes. apply evenly along the skin of the upper eyelid at base of eyelashes 3xs weekly at bedtime; repeat procedure for second eye (use a clean applicator).  . budesonide-formoterol (SYMBICORT) 80-4.5 MCG/ACT inhaler Inhale 1 puff into the lungs daily. (Patient taking differently: Inhale 1 puff into the lungs every morning. )  . Cholecalciferol (VITAMIN D3) 1000 UNITS CAPS Take by mouth daily.  Marland Kitchen levothyroxine (SYNTHROID, LEVOTHROID) 50 MCG tablet TAKE 1 TABLET(50 MCG) BY MOUTH DAILY (Patient taking differently: TAKE 1 TABLET(50 MCG) BY MOUTH EVERY AM)  . montelukast (SINGULAIR) 10 MG tablet TAKE 1 TABLET DAILY (Patient taking differently: TAKE 1 TABLET AT NIGHT)  . Multiple Vitamin (MULTIVITAMIN) capsule Take 1 capsule by mouth daily.    . pravastatin (PRAVACHOL) 40 MG tablet Take 1 tablet (40 mg total) by mouth at bedtime.  . ranitidine (ZANTAC) 75 MG tablet Take 75 mg by mouth as needed for heartburn.  . [DISCONTINUED] famotidine (PEPCID) 20 MG tablet Take 1 tablet (20 mg  total) by mouth 2 (two) times daily as needed for heartburn or indigestion.   No facility-administered encounter medications on file as of 02/23/2017.   :  Review of Systems:  Out of a complete 14 point review of systems, all are reviewed and negative with the exception of these symptoms as listed below:  Review of Systems  Neurological:       Patient states that she is doing the same as last visit. Reports a single episode of increased tremors after drinking caffeine yesterday. Patient says lack of sleep will trigger her tremors.     Objective:  Neurologic Exam  Physical Exam Physical Examination:   Vitals:   02/23/17 0927  BP: 118/64  Pulse: 68  Resp: 16   General Examination: The patient is a very pleasant 76 y.o. female in no acute distress. She appears well-developed and well-nourished and well groomed.   HEENT: Normocephalic, atraumatic, pupils are equal, round and reactive to light and accommodation. Extraocular tracking is good without limitation to gaze excursion or nystagmus noted. Normal smooth pursuit is noted. Hearing is grossly intact. Face is symmetric with normal facial animation and normal facial sensation. Speech is clear with no dysarthria noted. There is no hypophonia. There is no voice tremor but she has a very mild lower lip tremor, intermittent, stable. She has a very mild, intermittent yes-yes type head tremor. Neck is supple with full range of passive and active motion. Oropharynx exam reveals: mild mouth dryness, good dental hygiene and mild airway crowding, due to narrow airway entry. Mallampati is class II. Tongue protrudes centrally and palate elevates symmetrically.   Chest: Clear to auscultation without wheezing, rhonchi or crackles noted.  Heart: S1+S2+0, regular and normal without murmurs, rubs or gallops noted.   Abdomen: Soft, non-tender and non-distended with normal bowel sounds appreciated on auscultation.  Extremities: There is no pitting  edema in the distal lower extremities bilaterally. Pedal pulses are intact.  Skin: Warm and dry without trophic changes noted. There are no varicose veins.  Musculoskeletal: exam reveals no obvious joint deformities, mild L hip tenderness reported.   Neurologically:  Mental status: The patient is awake, alert and oriented in all 4 spheres. Her immediate and remote memory, attention, language skills and fund of knowledge are appropriate. There is no evidence of aphasia, agnosia, apraxia or anomia. Speech is clear with normal prosody and enunciation. Thought process is linear. Mood is normal and affect is normal.  Cranial nerves II - XII are as described above under HEENT exam. In addition: shoulder shrug is normal with equal shoulder height noted. Motor exam: Normal bulk, strength and tone is noted. There is no drift, but slight R sided resting tremor. She has a minimal right upper extremity and a mild intermittent left upper extremity postural tremor and minimal action tremor with both hands. She has no lower extremity tremor.  Romberg is negative. Reflexes are 1-2+ throughout. Fine motor skills and coordination: intact with normal finger taps, normal hand movements, normal rapid alternating patting, normal foot taps and normal foot agility.  Cerebellar testing: No dysmetria or intention tremor on finger to nose testing. Heel to shin is unremarkable bilaterally. There is no truncal or gait ataxia.  Sensory exam: intact to light touch in the upper and lower extremities.  Gait, station and balance: She stands easily. No veering to one side is noted. No leaning to one side is noted. Posture is age-appropriate and stance is narrow based. Gait shows normal stride length and normal pace. No problems turning are noted. She has preserved arm swing when walking. Tandem walk is slightly challenging for her.   Assessment and Plan:   In summary, BRIANNAH LONA is a very pleasant 76 year old female with  an underlying medical history of asthma, allergies, reflux disease, hypothyroidism, overweight state, and hyperlipidemia, who presents for follow-up consultation of her tremors. Her history and physical exam are in keeping with mild essential tremor, affecting both hands, left slightly worse than right. I talked to the patient again today about the diagnosis of ET, the prognosis and treatment options. Findings are still mild and fairly stable. Subjectively, she feels stable, has noted sleep deprivation and caffeine to be triggers. She has had some flareup of  allergies and reflux lately, she is advised to talk about improving symptoms and medication management with her primary care again. I suggested we continue to monitor her tremor clinically. Of note, because of her asthma she is not a good candidate for beta blocker, we could consider a small dose of Mysoline in the future. We talked about tremor triggers again today. I suggested a one-year checkup, sooner as needed. I answered all their questions today and the patient and her boyfriend were in agreement. I spent 25 minutes in total face-to-face time with the patient, more than 50% of which was spent in counseling and coordination of care, reviewing test results, reviewing medication and discussing or reviewing the diagnosis of ET, its prognosis and treatment options. Pertinent laboratory and imaging test results that were available during this visit with the patient were reviewed by me and considered in my medical decision making (see chart for details).

## 2017-02-23 NOTE — Telephone Encounter (Signed)
Pt called and would like to know what she can take OTC for hay fever.

## 2017-02-23 NOTE — Telephone Encounter (Signed)
claritin or zyrtec  Can also try flonase

## 2017-02-23 NOTE — Patient Instructions (Addendum)
Please remember, that any kind of tremor may be exacerbated by anxiety, anger, nervousness, excitement, dehydration, sleep deprivation, by caffeine, and low blood sugar values or blood sugar fluctuations. Some medications, especially some antidepressants and lithium can cause or exacerbate tremors. Tremors may temporarily calm down or subside with the use of a benzodiazepine such as Valium or related medications and with alcohol. Be aware, however, that drinking alcohol is not an approved or appropriate treatment for tremor control and long-term use of benzodiazepines such as Valium, lorazepam, alprazolam, or clonazepam can cause habit formation, physical and psychological addiction. There are very few medications that symptomatically help with tremor reduction, none are without potential side effects.   I think, your tremor has remained stable, we can do a one year check up.

## 2017-03-03 ENCOUNTER — Telehealth: Payer: Self-pay | Admitting: Neurology

## 2017-03-03 NOTE — Telephone Encounter (Signed)
I spoke to the patient.  She states that she is "very offended" with the print off of her medication list. The medication list has the phrasing "Patient taking differently" under some of her medications.  I explained to her that is the only way we can note any specifications she gives [place] about her medications without completely deleting the medication and re-entering it, increasing the risk of error.  Isabell said, "well you have a problem because you have a very offended customer". She stated that we were implying that she is not taking the medication as prescribed.  I expressed again that this is not the case at all. Tiffanie then said "I can easily go to [place] where they have a more agreeable computer system". I advised her that I would pass this along.

## 2017-03-03 NOTE — Telephone Encounter (Signed)
Patient called office in reference to after visit summary regarding her medication list.  Please call

## 2017-03-04 ENCOUNTER — Ambulatory Visit (INDEPENDENT_AMBULATORY_CARE_PROVIDER_SITE_OTHER): Payer: Medicare Other | Admitting: Internal Medicine

## 2017-03-04 ENCOUNTER — Encounter: Payer: Self-pay | Admitting: Internal Medicine

## 2017-03-04 VITALS — BP 116/72 | HR 90 | Temp 99.5°F | Resp 16 | Wt 193.0 lb

## 2017-03-04 DIAGNOSIS — J209 Acute bronchitis, unspecified: Secondary | ICD-10-CM

## 2017-03-04 DIAGNOSIS — J45901 Unspecified asthma with (acute) exacerbation: Secondary | ICD-10-CM

## 2017-03-04 MED ORDER — PREDNISONE 20 MG PO TABS: 40.0000 mg | ORAL_TABLET | Freq: Every day | ORAL | 0 refills | Status: DC

## 2017-03-04 MED ORDER — HYDROCOD POLST-CPM POLST ER 10-8 MG/5ML PO SUER: 5.0000 mL | Freq: Two times a day (BID) | ORAL | 0 refills | Status: DC | PRN

## 2017-03-04 MED ORDER — AZITHROMYCIN 250 MG PO TABS: ORAL_TABLET | ORAL | 0 refills | Status: DC

## 2017-03-04 NOTE — Patient Instructions (Signed)

## 2017-03-04 NOTE — Assessment & Plan Note (Signed)
Current symptoms suggestive of acute bronchitis with asthma exacerbation Will prescribe Z-Pak, which has been effective for her in the past prednisone and tussionex cough syrup Continue inhalers Continue over-the-counter cold medications, ibuprofen and Tylenol as needed for symptom relief Call if no improvement or with any questions

## 2017-03-04 NOTE — Progress Notes (Signed)
Subjective:    Patient ID: Melissa Lowe, female    DOB: 12/05/1940, 76 y.o.   MRN: [id number]  HPI She is here for an acute visit for cold symptoms. She just returned from visiting family and indicated there were sick. Her symptoms got worse after flying. She does have a history of asthma and often gets asthmatic bronchitis. Her last episode was 2 months ago.  Her symptoms started 4 days ago  She is experiencing sneezing, Nasal congestion, runny nose, sore throat, achy, fever of 101.5, cough that is productive, wheezing, and headaches. She has not experienced ear pain, shortness of breath gastrointestinal symptoms or dizziness.  She has tried taking asa, dayquil with some improvement in symptoms.  She has not used her inhalers today, but has been using them up until today.     Medications and allergies reviewed with patient and updated if appropriate.  Patient Active Problem List   Diagnosis Date Noted  . Hypothyroidism 01/12/2017  . Asthmatic bronchitis 01/12/2017  . GERD (gastroesophageal reflux disease) 01/12/2017  . PAC (premature atrial contraction) 09/22/2016  . Fatty liver, mild 05/19/2016  . Prediabetes 05/14/2016  . Benign essential tremor 05/14/2016  . Cystocele 05/14/2016  . RUQ pain 05/14/2016  . LIPOMA 03/10/2010  . Tinnitus 03/10/2010  . DIVERTICULOSIS, COLON 10/18/2008  . Hyperlipidemia 08/08/2007  . HIP PAIN 08/08/2007  . Asthma without acute exacerbation 08/02/2007  . Personal history of colonic polyps 08/02/2007    Current Outpatient Prescriptions on File Prior to Visit  Medication Sig Dispense Refill  . albuterol (PROAIR HFA) 108 (90 Base) MCG/ACT inhaler Inhale 1 puff into the lungs as needed. 54 g 3  . Ascorbic Acid (VITAMIN C) 500 MG tablet Take 500 mg by mouth daily.      . bimatoprost (LATISSE) 0.03 % ophthalmic solution Place into both eyes. apply evenly along the skin of the upper eyelid at base of eyelashes 3xs weekly at bedtime; repeat  procedure for second eye (use a clean applicator).    . budesonide-formoterol (SYMBICORT) 80-4.5 MCG/ACT inhaler Inhale 1 puff into the lungs daily. (Patient taking differently: Inhale 1 puff into the lungs every morning. ) 3 Inhaler 3  . Cholecalciferol (VITAMIN D3) 1000 UNITS CAPS Take by mouth daily.    Marland Kitchen levothyroxine (SYNTHROID, LEVOTHROID) 50 MCG tablet TAKE 1 TABLET(50 MCG) BY MOUTH DAILY (Patient taking differently: TAKE 1 TABLET(50 MCG) BY MOUTH EVERY AM) 90 tablet 0  . montelukast (SINGULAIR) 10 MG tablet TAKE 1 TABLET DAILY (Patient taking differently: TAKE 1 TABLET AT NIGHT) 90 tablet 0  . Multiple Vitamin (MULTIVITAMIN) capsule Take 1 capsule by mouth daily.      . pravastatin (PRAVACHOL) 40 MG tablet Take 1 tablet (40 mg total) by mouth at bedtime. 90 tablet 3  . ranitidine (ZANTAC) 75 MG tablet Take 75 mg by mouth as needed for heartburn.     No current facility-administered medications on file prior to visit.     Past Medical History:  Diagnosis Date  . Allergy   . Asthma   . Hyperlipidemia   . Hypothyroidism   . PAC (premature atrial contraction) 09/22/2016    Past Surgical History:  Procedure Laterality Date  . ABDOMINAL HYSTERECTOMY     2  fibroids  . ANTERIOR AND POSTERIOR REPAIR N/A 01/30/2013   Procedure:  POSTERIOR REPAIR (RECTOCELE);  Surgeon: Princess Bruins, MD;  Location: [facility];  Service: Gynecology;  Laterality: N/A;  . CATARACT EXTRACTION    . CHOLECYSTECTOMY    .  colon polypectomy  2001   Dr Earlean Shawl  . COLONOSCOPY  12/25/13   Tics , Dr Lajoyce Lauber 2020  . EYE SURGERY    . REPAIR FASCIAL DEFECT LEG  1972  . SEPTOPLASTY    . THIGH FASCIOTOMY      Social History   Social History  . Marital status: Married    Spouse name: [name]  . Number of children: 0  . Years of education: Master's    Social History Main Topics  . Smoking status: Never Smoker  . Smokeless tobacco: Never Used  . Alcohol use Yes     Comment: Wine couple days weekly  .  Drug use: No  . Sexual activity: Not on file   Other Topics Concern  . Not on file   Social History Narrative   Patient lives at home with her husband [name].    Patient has a Master's Degree and post grad.    Patient has no children.    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    Family History  Problem Relation Age of Onset  . Dementia Mother   . Cancer Father   . Asthma Sister   . Breast cancer Sister   . Stroke Maternal Grandmother        in 63s  . Stroke Paternal Grandmother 89  . Cancer Maternal Grandfather   . Hypertension Neg Hx   . Heart disease Neg Hx     Review of Systems  Constitutional: Positive for fever.  HENT: Positive for congestion, rhinorrhea, sneezing and sore throat. Negative for ear discharge.   Respiratory: Positive for cough (productive) and wheezing. Negative for shortness of breath.   Gastrointestinal: Negative for abdominal pain, diarrhea and nausea.  Musculoskeletal: Positive for myalgias.  Neurological: Positive for headaches. Negative for dizziness and light-headedness.       Objective:   Vitals:   03/04/17 1126  BP: 116/72  Pulse: 90  Resp: 16  Temp: 99.5 F (37.5 C)   Filed Weights   03/04/17 1126  Weight: 193 lb (87.5 kg)   Body mass index is 30 kg/m.  Wt Readings from Last 3 Encounters:  03/04/17 193 lb (87.5 kg)  02/23/17 192 lb (87.1 kg)  01/12/17 196 lb (88.9 kg)     Physical Exam GENERAL APPEARANCE: Appears stated age, well appearing, NAD EYES: conjunctiva clear, no icterus HEENT: bilateral tympanic membranes and ear canals normal, oropharynx with mild erythema, no thyromegaly, trachea midline, no cervical or supraclavicular lymphadenopathy LUNGS: No respiratory distress, unlabored breathing, good air entry bilaterally, coarse breath sounds with diffuse, fine expiratory wheezing, no crackles HEART: Normal S1,S2 without murmurs EXTREMITIES: Without clubbing, cyanosis, or edema        Assessment &  Plan:   See Problem List for Assessment and Plan of chronic medical problems.

## 2017-03-29 ENCOUNTER — Telehealth: Payer: Self-pay | Admitting: Internal Medicine

## 2017-03-29 NOTE — Telephone Encounter (Signed)
Pt will be going to [place] and since the altitude is very high, she will be at 7800 feet above sea level and she is concerned  About acute mountain sickness (AMS) since she has asthma, she would like to know if there are any other precautions she should take.   Would like a call back

## 2017-03-29 NOTE — Telephone Encounter (Signed)
We can do preventive treatment for altitude sickness with acetazolamide.   She would start the medication 24 hrs before or on the day or arrival and continue until 2-3 days after being there or upon leaving.  She would need to take it twice a day.

## 2017-03-30 MED ORDER — ACETAZOLAMIDE 125 MG PO TABS: ORAL_TABLET | ORAL | 0 refills | Status: DC

## 2017-03-30 NOTE — Telephone Encounter (Signed)
Spoke with pt, she would like to try the medication. Please send to [facility] on file.

## 2017-04-02 ENCOUNTER — Telehealth: Payer: Self-pay | Admitting: Internal Medicine

## 2017-04-02 NOTE — Telephone Encounter (Signed)
Pt called in said that she was give acetaZOLAMIDE (DIAMOX) 125 MG tablet [[id number]]   She said that it has sulfer in it and she is allergic to it.  What else can be called in?

## 2017-04-04 ENCOUNTER — Other Ambulatory Visit: Payer: Self-pay | Admitting: Internal Medicine

## 2017-04-20 ENCOUNTER — Other Ambulatory Visit: Payer: Self-pay | Admitting: Internal Medicine

## 2017-04-22 ENCOUNTER — Encounter: Payer: Self-pay | Admitting: Obstetrics & Gynecology

## 2017-04-22 ENCOUNTER — Ambulatory Visit (INDEPENDENT_AMBULATORY_CARE_PROVIDER_SITE_OTHER): Payer: Medicare Other | Admitting: Obstetrics & Gynecology

## 2017-04-22 VITALS — BP 114/76

## 2017-04-22 DIAGNOSIS — Z124 Encounter for screening for malignant neoplasm of cervix: Secondary | ICD-10-CM

## 2017-04-22 DIAGNOSIS — N8110 Cystocele, unspecified: Secondary | ICD-10-CM

## 2017-04-22 DIAGNOSIS — Z01411 Encounter for gynecological examination (general) (routine) with abnormal findings: Secondary | ICD-10-CM | POA: Diagnosis not present

## 2017-04-22 DIAGNOSIS — Z780 Asymptomatic menopausal state: Secondary | ICD-10-CM

## 2017-04-22 DIAGNOSIS — N816 Rectocele: Secondary | ICD-10-CM | POA: Diagnosis not present

## 2017-04-22 NOTE — Addendum Note (Signed)
Addended by: Thurnell Garbe A on: 04/22/2017 11:27 AM   Modules accepted: Orders

## 2017-04-22 NOTE — Progress Notes (Signed)
Melissa Lowe 1941/06/10 [id number]   History:    76 y.o. G0  Married.  Traveling a lot with husband.  RP:  Established patient presenting for annual gyn exam  HPI:  S/P Hysterectomy.  Menopause, no HRT.  Mild Sxs.  Well with Milex Ring with support #3.  No SUI.  No pain.  No abnormal d/c, no bleeding.  Staying in place.  Breasts wnl.    Past medical history,surgical history, family history and social history were all reviewed and documented in the EPIC chart.  Gynecologic History No LMP recorded. Patient is postmenopausal. Contraception: status post hysterectomy Last Pap: 11/2013. Results were: Neg/HPV HR neg. Last mammogram: 10/2016. Results were: Neg (Lt Dx and [place] also done, neg)  Obstetric History OB History  Gravida Para Term Preterm AB Living  0 0 0 0 0 0  SAB TAB Ectopic Multiple Live Births  0 0 0 0 0         ROS: A ROS was performed and pertinent positives and negatives are included in the history.  GENERAL: No fevers or chills. HEENT: No change in vision, no earache, sore throat or sinus congestion. NECK: No pain or stiffness. CARDIOVASCULAR: No chest pain or pressure. No palpitations. PULMONARY: No shortness of breath, cough or wheeze. GASTROINTESTINAL: No abdominal pain, nausea, vomiting or diarrhea, melena or bright red blood per rectum. GENITOURINARY: No urinary frequency, urgency, hesitancy or dysuria. MUSCULOSKELETAL: No joint or muscle pain, no back pain, no recent trauma. DERMATOLOGIC: No rash, no itching, no lesions. ENDOCRINE: No polyuria, polydipsia, no heat or cold intolerance. No recent change in weight. HEMATOLOGICAL: No anemia or easy bruising or bleeding. NEUROLOGIC: No headache, seizures, numbness, tingling or weakness. PSYCHIATRIC: No depression, no loss of interest in normal activity or change in sleep pattern.     Exam:   BP 114/76   There is no height or weight on file to calculate BMI.  General appearance : Well developed well  nourished female. No acute distress HEENT: Eyes: no retinal hemorrhage or exudates,  Neck supple, trachea midline, no carotid bruits, no thyroidmegaly Lungs: Clear to auscultation, no rhonchi or wheezes, or rib retractions  Heart: Regular rate and rhythm, no murmurs or gallops Breast:Examined in sitting and supine position were symmetrical in appearance, no palpable masses or tenderness,  no skin retraction, no nipple inversion, no nipple discharge, no skin discoloration, no axillary or supraclavicular lymphadenopathy Abdomen: no palpable masses or tenderness, no rebound or guarding Extremities: no edema or skin discoloration or tenderness  Pelvic: Vulva atrophic vaginitis  Bartholin, Urethra, Skene Glands: Within normal limits             Vagina: No gross lesions or discharge.  Cystocele grade 2/4 and Rectocele grade 1/3 in standing position with valsalva.  Pap reflex done.  Cervix/Uterus Absent  Adnexa  Without masses or tenderness  Anus and perineum  normal    Assessment/Plan:  76 y.o. female for annual exam   1. Encounter for gynecological examination with abnormal finding Gyn exam s/p Hysterectomy.  Atrophic vaginitis of menopause.  Pap reflex done.  Breasts wnl.  Last Mammo 10/2016.  2. Menopause present No HRT.  Menopausal Sxs mild and well tolerated.  Abstinent.  Will f/u for Bone Density.  Recommend Vit D supplement, Ca++ in nutrition and weight bearing physical activity.  3. Baden-Walker grade 2 cystocele Well with Milex Ring with Support # 3.  No ulceration. No SUI.  No complication.  Pessary cleaned and inserted  back.  Will continue with pessary at this time.  F/U in 3-4 months for pessary check/cleaning.  4. Baden-Walker grade 1 rectocele Well with pessary.  Counseling on above issues >50% x 15 minutes.   Princess Bruins MD, 10:23 AM 04/22/2017

## 2017-04-22 NOTE — Patient Instructions (Signed)
1. Encounter for gynecological examination with abnormal finding Gyn exam s/p Hysterectomy.  Atrophic vaginitis of menopause.  Pap reflex done.  Breasts wnl.  Last Mammo 10/2016.  2. Menopause present No HRT.  Menopausal Sxs mild and well tolerated.  Abstinent.  Will f/u for Bone Density.  Recommend Vit D supplement, Ca++ in nutrition and weight bearing physical activity.  3. Baden-Walker grade 2 cystocele Well with Milex Ring with Support # 3.  No ulceration. No SUI.  No complication.  Pessary cleaned and inserted back.  Will continue with pessary at this time.  F/U in 3-4 months for pessary check/cleaning.  4. Baden-Walker grade 1 rectocele Well with pessary.  Melissa Lowe, it was a pleasure to see you today!  I will inform you of your results as soon as available.

## 2017-04-26 LAB — PAP IG W/ RFLX HPV ASCU

## 2017-05-17 ENCOUNTER — Other Ambulatory Visit: Payer: Self-pay | Admitting: Gynecology

## 2017-05-17 ENCOUNTER — Other Ambulatory Visit: Payer: Self-pay | Admitting: Obstetrics & Gynecology

## 2017-05-17 DIAGNOSIS — Z780 Asymptomatic menopausal state: Secondary | ICD-10-CM

## 2017-05-25 ENCOUNTER — Ambulatory Visit (INDEPENDENT_AMBULATORY_CARE_PROVIDER_SITE_OTHER): Payer: Medicare Other

## 2017-05-25 ENCOUNTER — Encounter: Payer: Self-pay | Admitting: Gynecology

## 2017-05-25 DIAGNOSIS — Z780 Asymptomatic menopausal state: Secondary | ICD-10-CM

## 2017-06-10 ENCOUNTER — Telehealth: Payer: Self-pay | Admitting: Internal Medicine

## 2017-06-10 NOTE — Telephone Encounter (Signed)
She should call her pharmacy to make sure - only a very select thyroid meds were recalled

## 2017-06-10 NOTE — Telephone Encounter (Signed)
Pt called in and said that there is a recall on her thyroid med and would like to know what she needs to do?

## 2017-06-10 NOTE — Telephone Encounter (Signed)
Notified pt w/MD response.../lmb 

## 2017-06-11 ENCOUNTER — Telehealth: Payer: Self-pay | Admitting: *Deleted

## 2017-06-11 NOTE — Telephone Encounter (Signed)
Normal means all of the measurements fell within the normal range. If she has further questions about the report then Dr. Dellis Filbert would be happy to go over that with her. We can mail her a copy of the report if she would like.

## 2017-06-11 NOTE — Telephone Encounter (Signed)
Pt called received you letter regarding bone density patient would like to know your definition of "normal" states that is a vague,she would like to know more than "normal" I did offer at office visit she declined. Please advise

## 2017-06-11 NOTE — Telephone Encounter (Signed)
Left a detailed message on pt voicemail

## 2017-06-30 NOTE — Progress Notes (Addendum)
Subjective:   Melissa Lowe is a 76 y.o. female who presents for an Initial Medicare Annual Wellness Visit.  Review of Systems    No ROS.  Medicare Wellness Visit. Additional risk factors are reflected in the social history.   Cardiac Risk Factors include: advanced age (>62men, >109 women) Sleep patterns: feels rested on waking, gets up 1 times nightly to void and sleeps 7 hours nightly.    Home Safety/Smoke Alarms: Feels safe in home. Smoke alarms in place.  Living environment; residence and Firearm Safety: 1-story house/ trailer, no firearms. Lives with husband, no needs for DME, good support system Seat Belt Safety/Bike Helmet: Wears seat belt.     Objective:    Today's Vitals   07/01/17 0921  BP: 110/72  Pulse: 75  Resp: 16  Temp: 98.7 F (37.1 C)  TempSrc: Oral  SpO2: 97%  Weight: 194 lb (88 kg)  Height: 5\' 7"  (1.702 m)   Body mass index is 30.38 kg/m.   Current Medications (verified) Outpatient Encounter Prescriptions as of 07/01/2017  Medication Sig  . albuterol (PROAIR HFA) 108 (90 Base) MCG/ACT inhaler Inhale 1 puff into the lungs as needed.  . Ascorbic Acid (VITAMIN C) 500 MG tablet Take 500 mg by mouth daily.    . bimatoprost (LATISSE) 0.03 % ophthalmic solution Place into both eyes. apply evenly along the skin of the upper eyelid at base of eyelashes 3xs weekly at bedtime; repeat procedure for second eye (use a clean applicator).  . budesonide-formoterol (SYMBICORT) 80-4.5 MCG/ACT inhaler Inhale 1 puff into the lungs daily. (Patient taking differently: Inhale 1 puff into the lungs every morning. )  . Cholecalciferol (VITAMIN D3) 1000 UNITS CAPS Take by mouth daily.  Marland Kitchen levothyroxine (SYNTHROID, LEVOTHROID) 50 MCG tablet TAKE 1 TABLET(50 MCG) BY MOUTH DAILY (Patient taking differently: TAKE 1 TABLET(50 MCG) BY MOUTH EVERY AM)  . montelukast (SINGULAIR) 10 MG tablet Take 1 tablet (10 mg total) by mouth daily. -- Office visit needed for further refills  .  Multiple Vitamin (MULTIVITAMIN) capsule Take 1 capsule by mouth daily.    . pravastatin (PRAVACHOL) 40 MG tablet Take 1 tablet (40 mg total) by mouth at bedtime. --- Office visit and labs needed for further refills  . ranitidine (ZANTAC) 75 MG tablet Take 150 mg by mouth as needed for heartburn.    No facility-administered encounter medications on file as of 07/01/2017.     Allergies (verified) Sulfonamide derivatives and Tetracycline   History: Past Medical History:  Diagnosis Date  . Allergy   . Asthma   . Diverticulosis   . Fibroid   . Hyperlipidemia   . Hypothyroidism   . IBS (irritable bowel syndrome)   . PAC (premature atrial contraction) 09/22/2016   Past Surgical History:  Procedure Laterality Date  . ABDOMINAL HYSTERECTOMY     2  fibroids  . ANTERIOR AND POSTERIOR REPAIR N/A 01/30/2013   Procedure:  POSTERIOR REPAIR (RECTOCELE);  Surgeon: Princess Bruins, MD;  Location: [facility];  Service: Gynecology;  Laterality: N/A;  . CATARACT EXTRACTION    . CHOLECYSTECTOMY    . colon polypectomy  2001   Dr Earlean Shawl  . COLONOSCOPY  12/25/13   Tics , Dr Lajoyce Lauber 2020  . EYE SURGERY    . REPAIR FASCIAL DEFECT LEG  1972  . SEPTOPLASTY    . THIGH FASCIOTOMY     Family History  Problem Relation Age of Onset  . Dementia Mother   . Cancer Father   .  Asthma Sister   . Breast cancer Sister   . Stroke Maternal Grandmother        in 33s  . Stroke Paternal Grandmother 89  . Cancer Maternal Grandfather   . Hypertension Neg Hx   . Heart disease Neg Hx    Social History   Occupational History  . Not on file.   Social History Main Topics  . Smoking status: Never Smoker  . Smokeless tobacco: Never Used  . Alcohol use Yes     Comment: Osamah Schmader couple days weekly  . Drug use: No  . Sexual activity: Yes    Partners: Male     Comment: 1ST INTERCOURSE- ?   PARTNERS- 3    Tobacco Counseling Counseling given: Not Answered   Activities of Daily Living In your present state of  health, do you have any difficulty performing the following activities: 07/01/2017  Hearing? N  Vision? N  Difficulty concentrating or making decisions? N  Walking or climbing stairs? N  Dressing or bathing? N  Doing errands, shopping? N  Preparing Food and eating ? N  Using the Toilet? N  In the past six months, have you accidently leaked urine? N  Do you have problems with loss of bowel control? N  Managing your Medications? N  Managing your Finances? N  Housekeeping or managing your Housekeeping? N  Some recent data might be hidden    Immunizations and Health Maintenance Immunization History  Administered Date(s) Administered  . Hepatitis A 05/10/2009, 11/11/2009  . Influenza Whole 08/09/2007, 08/03/2009, 06/26/2012  . Influenza, High Dose Seasonal PF 07/04/2014  . Influenza-Unspecified 07/08/2013, 07/06/2015, 07/25/2016, 06/29/2017  . Pneumococcal Conjugate-13 07/17/2015, 07/25/2016  . Pneumococcal-Unspecified 10/27/2003  . Tdap 10/09/2014  . Tetanus 10/26/2001  . Zoster 10/27/2003   There are no preventive care reminders to display for this patient.  Patient Care Team: Binnie Rail, MD as PCP - General (Internal Medicine)  Indicate any recent Medical Services you may have received from other than [facility] providers in the past year (date may be approximate).     Assessment:   This is a routine wellness examination for [name]. Physical assessment deferred to PCP.   Hearing/Vision screen Hearing Screening Comments: Tinnitis, [facility] patient Vision Screening Comments: appointment yearly Dr. Jerline Pain, Dr. Bing Plume  Dietary issues and exercise activities discussed: Current Exercise Habits: The patient does not participate in regular exercise at present, Exercise limited by: None identified  Diet (meal preparation, eat out, water intake, caffeinated beverages, dairy products, fruits and vegetables): in general, a "healthy" diet  , well balanced, eats a variety of fruits  and vegetables daily, limits salt, fat/cholesterol, sugar, caffeine, drinks 6-8 glasses of water daily.      Goals    . Increase daily physical activity          Be as healthy and as active possible.       Depression Screen PHQ 2/9 Scores 07/01/2017 12/24/2016 01/12/2014  PHQ - 2 Score 0 0 0  PHQ- 9 Score 1 - -    Fall Risk Fall Risk  07/01/2017 02/23/2017 12/24/2016 02/20/2016 01/12/2014  Falls in the past year? No No No No No    Cognitive Function:       Ad8 score reviewed for issues:  Issues making decisions: no  Less interest in hobbies / activities: no  Repeats questions, stories (family complaining): no  Trouble using ordinary gadgets (microwave, computer, phone):no  Forgets the month or year: no  Mismanaging finances: no  Remembering appts: no  Daily problems with thinking and/or memory: no Ad8 score is= 0  Screening Tests Health Maintenance  Topic Date Due  . PNA vac Low Risk Adult (2 of 2 - PPSV23) 07/25/2017  . MAMMOGRAM  11/20/2018  . DEXA SCAN  05/26/2022  . COLONOSCOPY  12/26/2023  . TETANUS/TDAP  10/09/2024  . INFLUENZA VACCINE  Completed      Plan:  Continue doing brain stimulating activities (puzzles, reading, adult coloring books, staying active) to keep memory sharp.   Continue to eat heart healthy diet (full of fruits, vegetables, whole grains, lean protein, water--limit salt, fat, and sugar intake) and increase physical activity as tolerated.  I have personally reviewed and noted the following in the patient's chart:   . Medical and social history . Use of alcohol, tobacco or illicit drugs  . Current medications and supplements . Functional ability and status . Nutritional status . Physical activity . Advanced directives . List of other physicians . Vitals . Screenings to include cognitive, depression, and falls . Referrals and appointments  In addition, I have reviewed and discussed with patient certain preventive protocols, quality  metrics, and best practice recommendations. A written personalized care plan for preventive services as well as general preventive health recommendations were provided to patient.     Michiel Cowboy, RN   07/01/2017    Medical screening examination/treatment/procedure(s) were performed by non-physician practitioner and as supervising physician I was immediately available for consultation/collaboration. I agree with above. Binnie Rail, MD

## 2017-06-30 NOTE — Progress Notes (Signed)
Pre visit review using our clinic review tool, if applicable. No additional management support is needed unless otherwise documented below in the visit note. 

## 2017-06-30 NOTE — Patient Instructions (Addendum)
Continue doing brain stimulating activities (puzzles, reading, adult coloring books, staying active) to keep memory sharp.   Continue to eat heart healthy diet (full of fruits, vegetables, whole grains, lean protein, water--limit salt, fat, and sugar intake) and increase physical activity as tolerated.   Ms. Crocker , Thank you for taking time to come for your Medicare Wellness Visit. I appreciate your ongoing commitment to your health goals. Please review the following plan we discussed and let me know if I can assist you in the future.   These are the goals we discussed: Goals    . Increase daily physical activity          Be as healthy and active as possible.        This is a list of the screening recommended for you and due dates:  Health Maintenance  Topic Date Due  . Pneumonia vaccines (2 of 2 - PPSV23) 07/25/2017  . Mammogram  11/20/2018  . DEXA scan (bone density measurement)  05/26/2022  . Colon Cancer Screening  12/26/2023  . Tetanus Vaccine  10/09/2024  . Flu Shot  Completed     Test(s) ordered today. Your results will be released to [place] (or called to you) after review, usually within 72hours after test completion. If any changes need to be made, you will be notified at that same time.  All other Health Maintenance issues reviewed.   All recommended immunizations and age-appropriate screenings are up-to-date or discussed.  Flu immunization administered today.   Medications reviewed and updated.  No changes recommended at this time.    Please followup in 6 months    Ms. Karapetyan , Thank you for taking time to come for your Medicare Wellness Visit. I appreciate your ongoing commitment to your health goals. Please review the following plan we discussed and let me know if I can assist you in the future.   These are the goals we discussed: Goals    Start regular exercise and work on weight loss        Think about having the new shingles vaccine    This is  a list of the screening recommended for you and due dates:  Health Maintenance  Topic Date Due  . Pneumonia vaccines (2 of 2 - PPSV23) 07/25/2017  . Mammogram  11/20/2018  . DEXA scan (bone density measurement)  05/26/2022  . Colon Cancer Screening  12/26/2023  . Tetanus Vaccine  10/09/2024  . Flu Shot  Completed

## 2017-06-30 NOTE — Progress Notes (Signed)
Subjective:    Patient ID: Melissa Lowe, female    DOB: [birth year], 76 y.o.   MRN: [id number]  HPI She is here for a physical exam.   She has decreased her wine intake and drinks one glass of wine a week.   She eats out daily for lunch.  Her sugar in take is up and down.  She is not exercising regularly.    Medications and allergies reviewed with patient and updated if appropriate.  Patient Active Problem List   Diagnosis Date Noted  . Hypothyroidism 01/12/2017  . GERD (gastroesophageal reflux disease) 01/12/2017  . PAC (premature atrial contraction) 09/22/2016  . Fatty liver, mild 05/19/2016  . Prediabetes 05/14/2016  . Benign essential tremor 05/14/2016  . Cystocele 05/14/2016  . LIPOMA 03/10/2010  . Tinnitus 03/10/2010  . DIVERTICULOSIS, COLON 10/18/2008  . Hyperlipidemia 08/08/2007  . HIP PAIN 08/08/2007  . Asthma without acute exacerbation 08/02/2007  . Personal history of colonic polyps 08/02/2007    Current Outpatient Prescriptions on File Prior to Visit  Medication Sig Dispense Refill  . albuterol (PROAIR HFA) 108 (90 Base) MCG/ACT inhaler Inhale 1 puff into the lungs as needed. 54 g 3  . Ascorbic Acid (VITAMIN C) 500 MG tablet Take 500 mg by mouth daily.      . bimatoprost (LATISSE) 0.03 % ophthalmic solution Place into both eyes. apply evenly along the skin of the upper eyelid at base of eyelashes 3xs weekly at bedtime; repeat procedure for second eye (use a clean applicator).    . budesonide-formoterol (SYMBICORT) 80-4.5 MCG/ACT inhaler Inhale 1 puff into the lungs daily. (Patient taking differently: Inhale 1 puff into the lungs every morning. ) 3 Inhaler 3  . Cholecalciferol (VITAMIN D3) 1000 UNITS CAPS Take by mouth daily.    Marland Kitchen levothyroxine (SYNTHROID, LEVOTHROID) 50 MCG tablet TAKE 1 TABLET(50 MCG) BY MOUTH DAILY (Patient taking differently: TAKE 1 TABLET(50 MCG) BY MOUTH EVERY AM) 90 tablet 0  . montelukast (SINGULAIR) 10 MG tablet Take 1 tablet (10 mg  total) by mouth daily. -- Office visit needed for further refills 90 tablet 0  . Multiple Vitamin (MULTIVITAMIN) capsule Take 1 capsule by mouth daily.      . pravastatin (PRAVACHOL) 40 MG tablet Take 1 tablet (40 mg total) by mouth at bedtime. --- Office visit and labs needed for further refills 90 tablet 0  . ranitidine (ZANTAC) 75 MG tablet Take 150 mg by mouth as needed for heartburn.      No current facility-administered medications on file prior to visit.     Past Medical History:  Diagnosis Date  . Allergy   . Asthma   . Diverticulosis   . Fibroid   . Hyperlipidemia   . Hypothyroidism   . IBS (irritable bowel syndrome)   . PAC (premature atrial contraction) 09/22/2016    Past Surgical History:  Procedure Laterality Date  . ABDOMINAL HYSTERECTOMY     2  fibroids  . ANTERIOR AND POSTERIOR REPAIR N/A 01/30/2013   Procedure:  POSTERIOR REPAIR (RECTOCELE);  Surgeon: Princess Bruins, MD;  Location: [facility];  Service: Gynecology;  Laterality: N/A;  . CATARACT EXTRACTION    . CHOLECYSTECTOMY    . colon polypectomy  2001   Dr Earlean Shawl  . COLONOSCOPY  12/25/13   Tics , Dr Lajoyce Lauber 2020  . EYE SURGERY    . REPAIR FASCIAL DEFECT LEG  1972  . SEPTOPLASTY    . THIGH FASCIOTOMY      Social  History   Social History  . Marital status: Married    Spouse name: [name]  . Number of children: 0  . Years of education: Master's    Social History Main Topics  . Smoking status: Never Smoker  . Smokeless tobacco: Never Used  . Alcohol use Yes     Comment: Wine couple days weekly  . Drug use: No  . Sexual activity: Yes    Partners: Male     Comment: 1ST INTERCOURSE- ?   PARTNERS- 3   Other Topics Concern  . Not on file   Social History Narrative   Patient lives at home with her husband [name].    Patient has a Master's Degree and post grad.    Patient has no children.    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    Family History  Problem Relation Age of  Onset  . Dementia Mother   . Cancer Father   . Asthma Sister   . Breast cancer Sister   . Stroke Maternal Grandmother        in 37s  . Stroke Paternal Grandmother 89  . Cancer Maternal Grandfather   . Hypertension Neg Hx   . Heart disease Neg Hx     Review of Systems  Constitutional: Negative for chills and fever.  Eyes: Negative for visual disturbance.  Respiratory: Negative for cough, shortness of breath and wheezing.   Cardiovascular: Positive for chest pain (occ, has been evaluated by cardiology - not ischemia). Negative for palpitations and leg swelling.  Gastrointestinal: Positive for diarrhea (occ ). Negative for abdominal pain, blood in stool and constipation.  Genitourinary: Negative for dysuria and hematuria.  Musculoskeletal: Negative for arthralgias and back pain.  Skin: Negative for color change and rash.  Neurological: Negative for light-headedness and headaches.  Psychiatric/Behavioral: Negative for dysphoric mood. The patient is not nervous/anxious.        Objective:   Vitals:   07/01/17 0921  BP: 110/72  Pulse: 75  Resp: 16  Temp: 98.7 F (37.1 C)  SpO2: 97%   Filed Weights   07/01/17 0921  Weight: 194 lb (88 kg)   Body mass index is 30.38 kg/m.  Wt Readings from Last 3 Encounters:  07/01/17 194 lb (88 kg)  03/04/17 193 lb (87.5 kg)  02/23/17 192 lb (87.1 kg)     Physical Exam Constitutional: She appears well-developed and well-nourished. No distress.  HENT:  Head: Normocephalic and atraumatic.  Right Ear: External ear normal. Normal ear canal and TM Left Ear: External ear normal.  Normal ear canal and TM Mouth/Throat: Oropharynx is clear and moist.  Eyes: Conjunctivae and EOM are normal.  Neck: Neck supple. No tracheal deviation present. No thyromegaly present.  No carotid bruit  Cardiovascular: Normal rate, regular rhythm and normal heart sounds.   No murmur heard.  No edema. Pulmonary/Chest: Effort normal and breath sounds normal. No  respiratory distress. She has no wheezes. She has no rales.  Breast: deferred to Gyn Abdominal: Soft. She exhibits no distension. There is no tenderness.  Lymphadenopathy: She has no cervical adenopathy.  Skin: Skin is warm and dry. She is not diaphoretic.  Psychiatric: She has a normal mood and affect. Her behavior is normal.         Assessment & Plan:   Physical exam: Screening blood work  ordered Immunizations   Discussed shingrix, flu today Colonoscopy  Up to date  Mammogram   Up to date  89  Up to date  Dexa  Up to date  Eye exams   Up to date  EKG   Done 08/2016 Exercise  None - stressed regular exercise Weight   Advised weight loss Skin    One mole has grown - will see derm Substance abuse    none  See Problem List for Assessment and Plan of chronic medical problems.   FU in 6 months

## 2017-07-01 ENCOUNTER — Ambulatory Visit (INDEPENDENT_AMBULATORY_CARE_PROVIDER_SITE_OTHER): Payer: Medicare Other | Admitting: Internal Medicine

## 2017-07-01 ENCOUNTER — Encounter: Payer: Self-pay | Admitting: Internal Medicine

## 2017-07-01 VITALS — BP 110/72 | HR 75 | Temp 98.7°F | Resp 16 | Ht 67.0 in | Wt 194.0 lb

## 2017-07-01 DIAGNOSIS — K760 Fatty (change of) liver, not elsewhere classified: Secondary | ICD-10-CM | POA: Diagnosis not present

## 2017-07-01 DIAGNOSIS — E038 Other specified hypothyroidism: Secondary | ICD-10-CM

## 2017-07-01 DIAGNOSIS — Z0000 Encounter for general adult medical examination without abnormal findings: Secondary | ICD-10-CM | POA: Diagnosis not present

## 2017-07-01 DIAGNOSIS — R7303 Prediabetes: Secondary | ICD-10-CM | POA: Diagnosis not present

## 2017-07-01 DIAGNOSIS — E7800 Pure hypercholesterolemia, unspecified: Secondary | ICD-10-CM | POA: Diagnosis not present

## 2017-07-01 DIAGNOSIS — J45909 Unspecified asthma, uncomplicated: Secondary | ICD-10-CM | POA: Diagnosis not present

## 2017-07-01 DIAGNOSIS — K219 Gastro-esophageal reflux disease without esophagitis: Secondary | ICD-10-CM

## 2017-07-01 MED ORDER — MONTELUKAST SODIUM 10 MG PO TABS: 10.0000 mg | ORAL_TABLET | Freq: Every day | ORAL | 3 refills | Status: DC

## 2017-07-01 MED ORDER — PRAVASTATIN SODIUM 40 MG PO TABS: 40.0000 mg | ORAL_TABLET | Freq: Every day | ORAL | 1 refills | Status: DC

## 2017-07-01 MED ORDER — BUDESONIDE-FORMOTEROL FUMARATE 80-4.5 MCG/ACT IN AERO: 1.0000 | INHALATION_SPRAY | Freq: Every day | RESPIRATORY_TRACT | 3 refills | Status: DC

## 2017-07-01 MED ORDER — LEVOTHYROXINE SODIUM 50 MCG PO TABS: ORAL_TABLET | ORAL | 1 refills | Status: DC

## 2017-07-01 NOTE — Assessment & Plan Note (Signed)
Discussed concerns with fatty liver lfts have been normal, recheck Encouraged weight loss

## 2017-07-01 NOTE — Assessment & Plan Note (Signed)
Check tsh  Titrate med dose if needed  

## 2017-07-01 NOTE — Assessment & Plan Note (Signed)
fairly controlled continue current medication Continue GERD diet

## 2017-07-01 NOTE — Assessment & Plan Note (Signed)
Check lipid panel  Continue daily statin Regular exercise and healthy diet encouraged  

## 2017-07-01 NOTE — Assessment & Plan Note (Signed)
Check a1c Low sugar / carb diet Stressed regular exercise, weight loss  

## 2017-07-02 ENCOUNTER — Other Ambulatory Visit (INDEPENDENT_AMBULATORY_CARE_PROVIDER_SITE_OTHER): Payer: Medicare Other

## 2017-07-02 DIAGNOSIS — K760 Fatty (change of) liver, not elsewhere classified: Secondary | ICD-10-CM

## 2017-07-02 DIAGNOSIS — R7303 Prediabetes: Secondary | ICD-10-CM | POA: Diagnosis not present

## 2017-07-02 DIAGNOSIS — Z0000 Encounter for general adult medical examination without abnormal findings: Secondary | ICD-10-CM

## 2017-07-02 DIAGNOSIS — E7800 Pure hypercholesterolemia, unspecified: Secondary | ICD-10-CM

## 2017-07-02 DIAGNOSIS — E038 Other specified hypothyroidism: Secondary | ICD-10-CM

## 2017-07-02 LAB — LIPID PANEL
Cholesterol: 175 mg/dL (ref 0–200)
HDL: 54.4 mg/dL (ref >39.00)
LDL Cholesterol: 88 mg/dL (ref 0–99)
NonHDL: 120.48
Total CHOL/HDL Ratio: 3
Triglycerides: 161 mg/dL — ABNORMAL HIGH (ref 0.0–149.0)
VLDL: 32.2 mg/dL (ref 0.0–40.0)

## 2017-07-02 LAB — CBC WITH DIFFERENTIAL/PLATELET
BASOS ABS: 0.1 10*3/uL (ref 0.0–0.1)
Basophils Relative: 1.2 % (ref 0.0–3.0)
EOS ABS: 0.1 10*3/uL (ref 0.0–0.7)
EOS PCT: 2.4 % (ref 0.0–5.0)
HEMATOCRIT: 41.7 % (ref 36.0–46.0)
HEMOGLOBIN: 13.8 g/dL (ref 12.0–15.0)
LYMPHS ABS: 1.8 10*3/uL (ref 0.7–4.0)
LYMPHS PCT: 32.8 % (ref 12.0–46.0)
MCHC: 33.1 g/dL (ref 30.0–36.0)
MCV: 90 fl (ref 78.0–100.0)
Monocytes Absolute: 0.5 10*3/uL (ref 0.1–1.0)
Monocytes Relative: 8.8 % (ref 3.0–12.0)
NEUTROS ABS: 3.1 10*3/uL (ref 1.4–7.7)
Neutrophils Relative %: 54.8 % (ref 43.0–77.0)
Platelets: 255 10*3/uL (ref 150.0–400.0)
RBC: 4.64 Mil/uL (ref 3.87–5.11)
RDW: 13.9 % (ref 11.5–15.5)
WBC: 5.6 10*3/uL (ref 4.0–10.5)

## 2017-07-02 LAB — COMPREHENSIVE METABOLIC PANEL
ALBUMIN: 4.2 g/dL (ref 3.5–5.2)
ALK PHOS: 40 U/L (ref 39–117)
ALT: 14 U/L (ref 0–35)
AST: 17 U/L (ref 0–37)
BUN: 14 mg/dL (ref 6–23)
CO2: 28 mEq/L (ref 19–32)
Calcium: 10 mg/dL (ref 8.4–10.5)
Chloride: 105 mEq/L (ref 96–112)
Creatinine, Ser: 0.98 mg/dL (ref 0.40–1.20)
GFR: 58.95 mL/min — AB (ref >60.00)
Glucose, Bld: 83 mg/dL (ref 70–99)
POTASSIUM: 4.6 meq/L (ref 3.5–5.1)
SODIUM: 141 meq/L (ref 135–145)
TOTAL PROTEIN: 6.5 g/dL (ref 6.0–8.3)
Total Bilirubin: 0.4 mg/dL (ref 0.2–1.2)

## 2017-07-02 LAB — HEMOGLOBIN A1C: HEMOGLOBIN A1C: 5.8 % (ref 4.6–6.5)

## 2017-07-02 LAB — TSH: TSH: 3.87 u[IU]/mL (ref 0.35–4.50)

## 2017-07-03 ENCOUNTER — Encounter: Payer: Self-pay | Admitting: Internal Medicine

## 2017-07-05 ENCOUNTER — Other Ambulatory Visit: Payer: Self-pay | Admitting: Internal Medicine

## 2017-07-21 ENCOUNTER — Encounter: Payer: Self-pay | Admitting: Obstetrics & Gynecology

## 2017-07-21 ENCOUNTER — Ambulatory Visit (INDEPENDENT_AMBULATORY_CARE_PROVIDER_SITE_OTHER): Payer: Medicare Other | Admitting: Obstetrics & Gynecology

## 2017-07-21 VITALS — BP 130/80

## 2017-07-21 DIAGNOSIS — Z9289 Personal history of other medical treatment: Secondary | ICD-10-CM

## 2017-07-21 DIAGNOSIS — Z4689 Encounter for fitting and adjustment of other specified devices: Secondary | ICD-10-CM | POA: Diagnosis not present

## 2017-07-21 NOTE — Progress Notes (Signed)
    Melissa Lowe July 04, 1941 [id number]        76 y.o.  G0  Married.  About to travel to [place].  RP:  Pessary check  HPI:  Very satisfied with pessary.  No pelvic pain.  No vaginal bleeding, no vaginal d/c.  Pessary staying in place with all activities.  Past medical history,surgical history, problem list, medications, allergies, family history and social history were all reviewed and documented in the EPIC chart.  Directed ROS with pertinent positives and negatives documented in the history of present illness/assessment and plan.  Exam:  Vitals:   07/21/17 1023  BP: 130/80   General appearance:  Normal  Gyn exam:  Vulva normal                     Pessary removed and cleaned.  No discharge present.  Vaginal mucosa intact.  Pessary put back in place easily.  Bone density results discussed.  Normal T-Score at all levels.  T-Score and Z-Score explained.  Assessment/Plan:  76 y.o. G0  1. Encounter for pessary maintenance Very satisfied with Milex pessary ring with support.  Good fit/no problem.  Will f/u for Pessary check/cleaning in 4 months.  2. H/O bone density study Normal Bone Density.  T-Score normal at all levels.  T-Score is a value comparing your bone density to 74 yo women.  Continue with Vit D supplement/Ca++ in food and weight bearing physical activity.  Counseling on above issues >50% x 15 minutes  Princess Bruins MD, 10:31 AM 07/21/2017

## 2017-07-21 NOTE — Patient Instructions (Signed)
1. Encounter for pessary maintenance Very satisfied with Milex pessary ring with support.  Good fit/no problem.  Will f/u for Pessary check/cleaning in 4 months.  2. H/O bone density study Normal Bone Density.  T-Score normal at all levels.  T-Score is a value comparing your bone density to 74 yo women.  Continue with Vit D supplement/Ca++ in food and weight bearing physical activity.  Melissa Lowe, good to see you today, as always!  Enjoy your trip to [place]!

## 2017-09-09 ENCOUNTER — Other Ambulatory Visit: Payer: Self-pay | Admitting: Internal Medicine

## 2017-10-22 ENCOUNTER — Other Ambulatory Visit: Payer: Self-pay | Admitting: Obstetrics & Gynecology

## 2017-10-22 DIAGNOSIS — Z1231 Encounter for screening mammogram for malignant neoplasm of breast: Secondary | ICD-10-CM

## 2017-11-23 ENCOUNTER — Encounter: Payer: Self-pay | Admitting: Obstetrics & Gynecology

## 2017-11-23 ENCOUNTER — Ambulatory Visit (INDEPENDENT_AMBULATORY_CARE_PROVIDER_SITE_OTHER): Payer: Medicare Other | Admitting: Obstetrics & Gynecology

## 2017-11-23 ENCOUNTER — Ambulatory Visit: Payer: Medicare Other | Admitting: Obstetrics & Gynecology

## 2017-11-23 VITALS — BP 126/82

## 2017-11-23 DIAGNOSIS — Z4689 Encounter for fitting and adjustment of other specified devices: Secondary | ICD-10-CM

## 2017-11-23 NOTE — Patient Instructions (Signed)
1. Pessary maintenance Doing very well with the pessary.  Continue with Milex ring with support #3.  Do not recommend a surgery because on exam today he only has a grade 1/4 cystocele and minimal rectocele.  May want to try without the pessary at next exam.  Follow-up annual gynecologic exam and pessary maintenance June 2019.  Melissa Lowe, it was a pleasure seeing you today!

## 2017-11-23 NOTE — Progress Notes (Signed)
    Melissa Lowe [birth year] [id number]        77 y.o.  G0 Married.  Enjoyed her trip to [place] in 07/2017  RP: 25-month pessary check  HPI: S/P Hysterectomy.  H/O Cystocele grade 2/4 with rectocele grade 1/3.  Very well on Milex ring with support #3 pessary.  No vaginal pain or discomfort, no vaginal discharge, no vaginal bleeding.  No stress urinary incontinence.  Bowel movements normal.   OB History  Gravida Para Term Preterm AB Living  0 0 0 0 0 0  SAB TAB Ectopic Multiple Live Births  0 0 0 0 0        Past medical history,surgical history, problem list, medications, allergies, family history and social history were all reviewed and documented in the EPIC chart.   Directed ROS with pertinent positives and negatives documented in the history of present illness/assessment and plan.  Exam:  Vitals:   11/23/17 1153  BP: 126/82   General appearance:  Normal  Abdomen: Soft, nontender, nondistended.  Gynecologic exam: Vulva normal.  Pessary removed.  No abnormal vaginal discharge.  Pessary cleaned.  In the lying down position with Valsalva, cystocele maximum grade 1/4 and minimal rectocele.  Vaginal mucosa normal.  Pessary reinserted easily.   Assessment/Plan:  77 y.o. G0  1. Pessary maintenance Doing very well with the pessary.  Continue with Milex ring with support #3.  Do not recommend a surgery because on exam today he only has a grade 1/4 cystocele and minimal rectocele.  May want to try without the pessary at next exam.  Follow-up annual gynecologic exam and pessary maintenance June 2019.  Counseling on above issues more than 50% for 15 minutes.  Princess Bruins MD, 12:11 PM 11/23/2017

## 2017-11-24 ENCOUNTER — Ambulatory Visit
Admission: RE | Admit: 2017-11-24 | Discharge: 2017-11-24 | Disposition: A | Discharge status: Home / Self Care | Payer: Medicare Other | Source: Ambulatory Visit | Attending: Obstetrics & Gynecology | Admitting: Obstetrics & Gynecology

## 2017-11-24 ENCOUNTER — Ambulatory Visit: Payer: Medicare Other | Admitting: Obstetrics & Gynecology

## 2017-11-24 DIAGNOSIS — Z1231 Encounter for screening mammogram for malignant neoplasm of breast: Secondary | ICD-10-CM

## 2017-11-25 ENCOUNTER — Telehealth: Payer: Self-pay | Admitting: Internal Medicine

## 2017-11-25 MED ORDER — MONTELUKAST SODIUM 10 MG PO TABS: 10.0000 mg | ORAL_TABLET | Freq: Every day | ORAL | 0 refills | Status: DC

## 2017-11-25 MED ORDER — MONTELUKAST SODIUM 10 MG PO TABS: 10.0000 mg | ORAL_TABLET | Freq: Every day | ORAL | 3 refills | Status: DC

## 2017-11-25 NOTE — Telephone Encounter (Signed)
Copied from [place]. Topic: Quick Communication - Rx Refill/Question >> Nov 25, 2017  9:11 AM Percell Belt A wrote: Medication: budesonide-formoterol (montelukast (SINGULAIR) 10 MG tablet [[id number]]    Has the patient contacted their pharmacy? No    (Agent: If no, request that the patient contact the pharmacy for the refill.)   Preferred Pharmacy (with phone number or street name): pt called in and needs a  small supply sent to local pharmacy ( [facility] on [address]) and can [facility]  They are tell her they do not have it    Agent: Please be advised that RX refills may take up to 3 business days. We ask that you follow-up with your pharmacy.

## 2017-11-25 NOTE — Addendum Note (Signed)
Addended by: Earnstine Regal on: 11/25/2017 10:59 AM   Modules accepted: Orders

## 2017-11-25 NOTE — Telephone Encounter (Signed)
Reviewed chart pt is up-to-date sent refills to both pharmacy../[facility] . 

## 2017-12-28 ENCOUNTER — Other Ambulatory Visit: Payer: Self-pay | Admitting: Internal Medicine

## 2017-12-29 ENCOUNTER — Ambulatory Visit: Payer: Medicare Other | Admitting: Internal Medicine

## 2017-12-29 ENCOUNTER — Encounter: Payer: Self-pay | Admitting: Internal Medicine

## 2017-12-29 VITALS — BP 144/80 | HR 71 | Temp 98.0°F | Resp 16 | Wt 199.0 lb

## 2017-12-29 DIAGNOSIS — R7303 Prediabetes: Secondary | ICD-10-CM | POA: Diagnosis not present

## 2017-12-29 DIAGNOSIS — E7849 Other hyperlipidemia: Secondary | ICD-10-CM | POA: Diagnosis not present

## 2017-12-29 DIAGNOSIS — M722 Plantar fascial fibromatosis: Secondary | ICD-10-CM | POA: Insufficient documentation

## 2017-12-29 DIAGNOSIS — E038 Other specified hypothyroidism: Secondary | ICD-10-CM

## 2017-12-29 DIAGNOSIS — J45909 Unspecified asthma, uncomplicated: Secondary | ICD-10-CM

## 2017-12-29 DIAGNOSIS — K219 Gastro-esophageal reflux disease without esophagitis: Secondary | ICD-10-CM | POA: Diagnosis not present

## 2017-12-29 MED ORDER — LEVOTHYROXINE SODIUM 50 MCG PO TABS: ORAL_TABLET | ORAL | 1 refills | Status: DC

## 2017-12-29 MED ORDER — MONTELUKAST SODIUM 10 MG PO TABS: 10.0000 mg | ORAL_TABLET | Freq: Every day | ORAL | 1 refills | Status: DC

## 2017-12-29 MED ORDER — LEVOTHYROXINE SODIUM 50 MCG PO TABS
ORAL_TABLET | ORAL | 1 refills | Status: DC
Start: 2017-12-29 — End: 2017-12-29

## 2017-12-29 NOTE — Assessment & Plan Note (Signed)
Controlled with Zantac over-the-counter-increases or decreases dose based on symptoms

## 2017-12-29 NOTE — Progress Notes (Signed)
Subjective:    Patient ID: Melissa Lowe, female    DOB: 20-Feb-1941, 77 y.o.   MRN: [id number]  HPI The patient is here for follow up.  Prediabetes:  She is more compliant with a low sugar/carbohydrate diet. She is overeating - eats goes out to eat every day for lunch. She is not exercising regularly.  Hyperlipidemia: She is taking her medication daily. She is compliant with a low fat/cholesterol diet. She is not exercising regularly. She denies myalgias.   Hypothyroidism:  She is taking her medication daily.  She denies any recent changes in energy or weight that are unexplained.   Asthma;  She takes the symbicort once daily and singulair.  She uses albuterol as needed.  She has had a couple times a year she feels that she has difficulty taking a deep breath, but this does not last long.  She denies any coughing, wheezing or shortness of breath now.  Overall she feels her asthma is well controlled.  GERD: Her GERD overall is controlled.  She is taking Zantac over-the-counter and adjusts her dose based on her symptoms.  Medications and allergies reviewed with patient and updated if appropriate.  Patient Active Problem List   Diagnosis Date Noted  . Hypothyroidism 01/12/2017  . GERD (gastroesophageal reflux disease) 01/12/2017  . PAC (premature atrial contraction) 09/22/2016  . Fatty liver, mild 05/19/2016  . Prediabetes 05/14/2016  . Benign essential tremor 05/14/2016  . Cystocele 05/14/2016  . LIPOMA 03/10/2010  . Tinnitus 03/10/2010  . DIVERTICULOSIS, COLON 10/18/2008  . Hyperlipidemia 08/08/2007  . HIP PAIN 08/08/2007  . Asthma without acute exacerbation 08/02/2007  . Personal history of colonic polyps 08/02/2007    Current Outpatient Medications on File Prior to Visit  Medication Sig Dispense Refill  . albuterol (PROAIR HFA) 108 (90 Base) MCG/ACT inhaler Inhale 1 puff into the lungs as needed. 54 g 3  . Ascorbic Acid (VITAMIN C) 500 MG tablet Take 500 mg by mouth  daily.      . bimatoprost (LATISSE) 0.03 % ophthalmic solution Place into both eyes. apply evenly along the skin of the upper eyelid at base of eyelashes 3xs weekly at bedtime; repeat procedure for second eye (use a clean applicator).    . budesonide-formoterol (SYMBICORT) 80-4.5 MCG/ACT inhaler Inhale 1 puff into the lungs daily. 3 Inhaler 3  . Cholecalciferol (VITAMIN D3) 1000 UNITS CAPS Take by mouth daily.    Marland Kitchen levothyroxine (SYNTHROID, LEVOTHROID) 50 MCG tablet TAKE 1 TABLET(50 MCG) BY MOUTH EVERY AM 90 tablet 1  . montelukast (SINGULAIR) 10 MG tablet Take 1 tablet (10 mg total) by mouth daily. 10 tablet 0  . Multiple Vitamin (MULTIVITAMIN) capsule Take 1 capsule by mouth daily.      . pravastatin (PRAVACHOL) 40 MG tablet TAKE 1 TABLET AT BEDTIME 90 tablet 1  . ranitidine (ZANTAC) 75 MG tablet Take 150 mg by mouth as needed for heartburn.      No current facility-administered medications on file prior to visit.     Past Medical History:  Diagnosis Date  . Allergy   . Asthma   . Diverticulosis   . Fibroid   . Hyperlipidemia   . Hypothyroidism   . IBS (irritable bowel syndrome)   . PAC (premature atrial contraction) 09/22/2016    Past Surgical History:  Procedure Laterality Date  . ABDOMINAL HYSTERECTOMY     2  fibroids  . ANTERIOR AND POSTERIOR REPAIR N/A 01/30/2013   Procedure:  POSTERIOR REPAIR (RECTOCELE);  Surgeon: Princess Bruins, MD;  Location: [facility];  Service: Gynecology;  Laterality: N/A;  . CATARACT EXTRACTION    . CHOLECYSTECTOMY    . colon polypectomy  2001   Dr Earlean Shawl  . COLONOSCOPY  12/25/13   Tics , Dr Lajoyce Lauber 2020  . EYE SURGERY    . REPAIR FASCIAL DEFECT LEG  1972  . SEPTOPLASTY    . THIGH FASCIOTOMY      Social History   Socioeconomic History  . Marital status: Married    Spouse name: [name]  . Number of children: 0  . Years of education: Master's   . Highest education level: None  Social Needs  . Financial resource strain: None  . Food  insecurity - worry: None  . Food insecurity - inability: None  . Transportation needs - medical: None  . Transportation needs - non-medical: None  Occupational History  . None  Tobacco Use  . Smoking status: Never Smoker  . Smokeless tobacco: Never Used  Substance and Sexual Activity  . Alcohol use: Yes    Comment: Wine couple days weekly  . Drug use: No  . Sexual activity: Yes    Partners: Male    Comment: 1ST INTERCOURSE- ?   PARTNERS- 3  Other Topics Concern  . None  Social History Narrative   Patient lives at home with her husband [name].    Patient has a Master's Degree and post grad.    Patient has no children.    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    Family History  Problem Relation Age of Onset  . Dementia Mother   . Cancer Father   . Asthma Sister   . Breast cancer Sister   . Stroke Maternal Grandmother        in 57s  . Stroke Paternal Grandmother 89  . Cancer Maternal Grandfather   . Hypertension Neg Hx   . Heart disease Neg Hx     Review of Systems  Constitutional: Negative for chills and fever.  Respiratory: Negative for cough, shortness of breath and wheezing.   Cardiovascular: Negative for chest pain, palpitations (extra beat on occasion) and leg swelling.       Objective:   Vitals:   12/29/17 1559  BP: (!) 144/80  Pulse: 71  Resp: 16  Temp: 98 F (36.7 C)  SpO2: 98%   Wt Readings from Last 3 Encounters:  12/29/17 199 lb (90.3 kg)  07/01/17 194 lb (88 kg)  03/04/17 193 lb (87.5 kg)   Body mass index is 31.17 kg/m.   Physical Exam    Constitutional: Appears well-developed and well-nourished. No distress.  HENT:  Head: Normocephalic and atraumatic.  Neck: Neck supple. No tracheal deviation present. No thyromegaly present.  No cervical lymphadenopathy Cardiovascular: Normal rate, regular rhythm and normal heart sounds.   No murmur heard. No carotid bruit .  No edema Pulmonary/Chest: Effort normal and breath sounds  normal. No respiratory distress. No has no wheezes. No rales.  Skin: Skin is warm and dry. Not diaphoretic.  Psychiatric: Normal mood and affect. Behavior is normal.      Assessment & Plan:    See Problem List for Assessment and Plan of chronic medical problems.

## 2017-12-29 NOTE — Assessment & Plan Note (Signed)
Check tsh  Titrate med dose if needed  

## 2017-12-29 NOTE — Assessment & Plan Note (Addendum)
Taking Symbicort once daily, singulair Rarely albuterol Overall controlled Continue current medications

## 2017-12-29 NOTE — Patient Instructions (Addendum)
You should have a booster for pneumovax pneumonia vaccine.  [facility]  - Dr hewitt - plantar fasciitis   Test(s) ordered today. Your results will be released to [place] (or called to you) after review, usually within 72hours after test completion. If any changes need to be made, you will be notified at that same time.   Medications reviewed and updated.  No changes recommended at this time.  Your prescription(s) have been submitted to your pharmacy. Please take as directed and contact our office if you believe you are having problem(s) with the medication(s).   Please followup in 6 months

## 2017-12-29 NOTE — Assessment & Plan Note (Signed)
Check lipid panel  Continue daily statin Regular exercise and healthy diet encouraged  

## 2017-12-29 NOTE — Assessment & Plan Note (Signed)
Check a1c Low sugar / carb diet Stressed regular exercise, weight loss  

## 2017-12-30 ENCOUNTER — Ambulatory Visit: Payer: Medicare Other | Admitting: Internal Medicine

## 2018-01-14 ENCOUNTER — Other Ambulatory Visit (INDEPENDENT_AMBULATORY_CARE_PROVIDER_SITE_OTHER): Payer: Medicare Other

## 2018-01-14 DIAGNOSIS — R7303 Prediabetes: Secondary | ICD-10-CM

## 2018-01-14 DIAGNOSIS — E038 Other specified hypothyroidism: Secondary | ICD-10-CM

## 2018-01-14 DIAGNOSIS — E7849 Other hyperlipidemia: Secondary | ICD-10-CM

## 2018-01-14 LAB — CBC WITH DIFFERENTIAL/PLATELET
BASOS PCT: 1.3 % (ref 0.0–3.0)
Basophils Absolute: 0.1 10*3/uL (ref 0.0–0.1)
Eosinophils Absolute: 0.1 10*3/uL (ref 0.0–0.7)
Eosinophils Relative: 2.3 % (ref 0.0–5.0)
HCT: 39 % (ref 36.0–46.0)
Hemoglobin: 13.2 g/dL (ref 12.0–15.0)
LYMPHS ABS: 1.9 10*3/uL (ref 0.7–4.0)
Lymphocytes Relative: 32.5 % (ref 12.0–46.0)
MCHC: 34 g/dL (ref 30.0–36.0)
MCV: 88.6 fl (ref 78.0–100.0)
MONO ABS: 0.5 10*3/uL (ref 0.1–1.0)
MONOS PCT: 8.4 % (ref 3.0–12.0)
NEUTROS ABS: 3.2 10*3/uL (ref 1.4–7.7)
NEUTROS PCT: 55.5 % (ref 43.0–77.0)
PLATELETS: 248 10*3/uL (ref 150.0–400.0)
RBC: 4.41 Mil/uL (ref 3.87–5.11)
RDW: 14.5 % (ref 11.5–15.5)
WBC: 5.8 10*3/uL (ref 4.0–10.5)

## 2018-01-14 LAB — COMPREHENSIVE METABOLIC PANEL
ALT: 15 U/L (ref 0–35)
AST: 15 U/L (ref 0–37)
Albumin: 4.3 g/dL (ref 3.5–5.2)
Alkaline Phosphatase: 43 U/L (ref 39–117)
BUN: 16 mg/dL (ref 6–23)
CALCIUM: 9.7 mg/dL (ref 8.4–10.5)
CO2: 25 meq/L (ref 19–32)
Chloride: 106 mEq/L (ref 96–112)
Creatinine, Ser: 0.92 mg/dL (ref 0.40–1.20)
GFR: 63.31 mL/min (ref >60.00)
GLUCOSE: 95 mg/dL (ref 70–99)
POTASSIUM: 4.4 meq/L (ref 3.5–5.1)
Sodium: 141 mEq/L (ref 135–145)
Total Bilirubin: 0.5 mg/dL (ref 0.2–1.2)
Total Protein: 6.7 g/dL (ref 6.0–8.3)

## 2018-01-14 LAB — LIPID PANEL
CHOL/HDL RATIO: 3
CHOLESTEROL: 177 mg/dL (ref 0–200)
HDL: 57.6 mg/dL (ref >39.00)
LDL CALC: 90 mg/dL (ref 0–99)
NonHDL: 119.77
TRIGLYCERIDES: 147 mg/dL (ref 0.0–149.0)
VLDL: 29.4 mg/dL (ref 0.0–40.0)

## 2018-01-14 LAB — TSH: TSH: 2.83 u[IU]/mL (ref 0.35–4.50)

## 2018-01-14 LAB — HEMOGLOBIN A1C: Hgb A1c MFr Bld: 5.8 % (ref 4.6–6.5)

## 2018-01-15 ENCOUNTER — Encounter: Payer: Self-pay | Admitting: Internal Medicine

## 2018-02-23 DIAGNOSIS — K648 Other hemorrhoids: Secondary | ICD-10-CM | POA: Insufficient documentation

## 2018-02-24 ENCOUNTER — Telehealth: Payer: Self-pay | Admitting: Emergency Medicine

## 2018-02-24 ENCOUNTER — Ambulatory Visit: Payer: Medicare Other | Admitting: Neurology

## 2018-02-24 NOTE — Telephone Encounter (Signed)
Copied from [place] [id number]. Topic: Inquiry >> Feb 24, 2018  8:27 AM Melissa Lowe, NT wrote: Reason for CRM: patient is calling and states her and her husband like to travel a lot and would like to know if she needs a type of measle protection.

## 2018-02-24 NOTE — Telephone Encounter (Signed)
Spoke with pt to advise that insurance may not cover titer for measles. Out of pocket cost is $160. Advised pt that immunization takes 2-3 weeks to go into affect. Pt states that she will contact us after the return from a cruise if they would like blood work done.

## 2018-03-17 ENCOUNTER — Encounter: Payer: Self-pay | Admitting: Neurology

## 2018-03-17 ENCOUNTER — Ambulatory Visit (INDEPENDENT_AMBULATORY_CARE_PROVIDER_SITE_OTHER): Payer: Medicare Other | Admitting: Neurology

## 2018-03-17 VITALS — BP 117/66 | HR 80 | Ht 67.0 in | Wt 192.0 lb

## 2018-03-17 DIAGNOSIS — G250 Essential tremor: Secondary | ICD-10-CM | POA: Diagnosis not present

## 2018-03-17 NOTE — Progress Notes (Signed)
Subjective:    Patient ID: Melissa Lowe is a 77 y.o. female.  HPI     Interim history:   Melissa Lowe is a 77 year old right-handed woman with an underlying medical history of asthma, hypothyroidism and hyperlipidemia, who presents for follow-up consultation of her tremors, for her yearly checkup. She is unaccompanied today. I last saw her on 02/23/2017, at which time we talked about tremor triggers. In her case caffeine and sleep deprivation seem to be triggers. She felt she was fairly stable. She reported left hip pain and was status post injection in March 2018. She had some increased allergy symptoms, asthma was stable, she had reflux issues, had stopped her PPI. I suggested a one-year checkup.   Today, 03/17/2018: She reports feeling overall fairly stable as far as the tremor. She tries to hydrate well enough with water, limits caffeine. She has not had any recent changes in her history. Asthma stable, no recent changes in her meds. She is trying to eat healthier. Weight is stable.       The patient's allergies, current medications, family history, past medical history, past social history, past surgical history and problem list were reviewed and updated as appropriate.    Previously (copied from previous notes for reference):   I saw her on 02/20/2016, at which time she reported feeling stable. On examination, her tremor was stable. She had no signs of parkinsonism. She reported that she did genetic testing through [facility] and me and reported having markers for kidney disease, lung disease and Alzheimer's disease. We mutually agreed to hold off on symptomatic treatment of her tremors. I suggested one-year checkup.   I first met her on 02/05/2015, at which time she reported feeling stable, no significant worsening of her tremors. She was not impaired in her day-to-day functioning, her ADLs. She felt that her left hand was worse than the right. She had lip quiver but no voice tremor. Exam  appeared to be stable. She had no balance issues and thankfully no recent falls. We mutually agreed to monitor her symptoms and not try any medication but we did talk about potentially trying Mysoline down the [place]. She worried about potential side effects. I suggested one-year checkup.    She previously saw Dr. Jim Like and was last seen by him on 07/04/2014 at which time he suggested a 6 mo follow-up and observation, as her tremors were mild. Prior to that, she had seen Dr. Erling Cruz, last in December 2013. She had a new onset lip tremor in or around 2010 which was progressive and then developed hand tremors, left more than right as well as difficulty holding something such as a fork or bulk. She did not have much in the way of improvement with alcohol consumption. She did note that asthma medication made her tremors worse. She does have a family history of tremors in her mother, who developed a tremor in her 58s.   Her Past Medical History Is Significant For: Past Medical History:  Diagnosis Date  . Allergy   . Asthma   . Diverticulosis   . Fibroid   . Hyperlipidemia   . Hypothyroidism   . IBS (irritable bowel syndrome)   . PAC (premature atrial contraction) 09/22/2016    Her Past Surgical History Is Significant For: Past Surgical History:  Procedure Laterality Date  . ABDOMINAL HYSTERECTOMY     2  fibroids  . ANTERIOR AND POSTERIOR REPAIR N/A 01/30/2013   Procedure:  POSTERIOR REPAIR (RECTOCELE);  Surgeon: Truddie Hidden  Dellis Filbert, MD;  Location: [facility];  Service: Gynecology;  Laterality: N/A;  . CATARACT EXTRACTION    . CHOLECYSTECTOMY    . colon polypectomy  2001   Dr Earlean Shawl  . COLONOSCOPY  12/25/13   Tics , Dr Lajoyce Lauber 2020  . EYE SURGERY    . REPAIR FASCIAL DEFECT LEG  1972  . SEPTOPLASTY    . THIGH FASCIOTOMY      Her Family History Is Significant For: Family History  Problem Relation Age of Onset  . Dementia Mother   . Cancer Father   . Asthma Sister   . Breast cancer  Sister   . Stroke Maternal Grandmother        in 60s  . Stroke Paternal Grandmother 89  . Cancer Maternal Grandfather   . Hypertension Neg Hx   . Heart disease Neg Hx     Her Social History Is Significant For: Social History   Socioeconomic History  . Marital status: Married    Spouse name: [name]  . Number of children: 0  . Years of education: Master's   . Highest education level: Not on file  Occupational History  . Not on file  Social Needs  . Financial resource strain: Not on file  . Food insecurity:    Worry: Not on file    Inability: Not on file  . Transportation needs:    Medical: Not on file    Non-medical: Not on file  Tobacco Use  . Smoking status: Never Smoker  . Smokeless tobacco: Never Used  Substance and Sexual Activity  . Alcohol use: Yes    Comment: Wine couple days weekly  . Drug use: No  . Sexual activity: Yes    Partners: Male    Comment: 1ST INTERCOURSE- ?   PARTNERS- 3  Lifestyle  . Physical activity:    Days per week: Not on file    Minutes per session: Not on file  . Stress: Not on file  Relationships  . Social connections:    Talks on phone: Not on file    Gets together: Not on file    Attends religious service: Not on file    Active member of club or organization: Not on file    Attends meetings of clubs or organizations: Not on file    Relationship status: Not on file  Other Topics Concern  . Not on file  Social History Narrative   Patient lives at home with her husband [name].    Patient has a Master's Degree and post grad.    Patient has no children.    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    Her Allergies Are:  Allergies  Allergen Reactions  . Sulfonamide Derivatives     Rash on inside of arms Because of a history of documented adverse serious drug reaction;Medi Alert bracelet  is recommended  . Tetracycline     Severe itching on lower leg  :   Her Current Medications Are:  Outpatient Encounter  Medications as of 03/17/2018  Medication Sig  . albuterol (PROAIR HFA) 108 (90 Base) MCG/ACT inhaler Inhale 1 puff into the lungs as needed.  . Ascorbic Acid (VITAMIN C) 500 MG tablet Take 500 mg by mouth daily.    . bimatoprost (LATISSE) 0.03 % ophthalmic solution Place into both eyes. apply evenly along the skin of the upper eyelid at base of eyelashes 3xs weekly at bedtime; repeat procedure for second eye (use a clean applicator).  Marland Kitchen  budesonide-formoterol (SYMBICORT) 80-4.5 MCG/ACT inhaler Inhale 1 puff into the lungs daily.  . Cholecalciferol (VITAMIN D3) 1000 UNITS CAPS Take by mouth daily.  Marland Kitchen levothyroxine (SYNTHROID, LEVOTHROID) 50 MCG tablet TAKE 1 TABLET(50 MCG) BY MOUTH EVERY AM  . montelukast (SINGULAIR) 10 MG tablet Take 1 tablet (10 mg total) by mouth daily.  . Multiple Vitamin (MULTIVITAMIN) capsule Take 1 capsule by mouth daily.    . pravastatin (PRAVACHOL) 40 MG tablet TAKE 1 TABLET AT BEDTIME  . ranitidine (ZANTAC) 75 MG tablet Take 150 mg by mouth as needed for heartburn.    No facility-administered encounter medications on file as of 03/17/2018.   :  Review of Systems:  Out of a complete 14 point review of systems, all are reviewed and negative with the exception of these symptoms as listed below: Review of Systems  Neurological:       Pt presents today to discuss her tremor. Pt feels that her tremor has been stable.    Objective:  Neurological Exam  Physical Exam Physical Examination:   Vitals:   03/17/18 1257  BP: 117/66  Pulse: 80    General Examination: The patient is a very pleasant 77 y.o. female in no acute distress. She appears well-developed and well-nourished and well groomed.   HEENT:Normocephalic, atraumatic, pupils are equal, round and reactive to light. Extraocular tracking is good without limitation to gaze excursion or nystagmus noted. Normal smooth pursuit is noted. Hearing is grossly intact. Face is symmetric with normal facial animation and  normal facial sensation. Speech is clear with no dysarthria noted. There is no hypophonia. There is no voice tremor but she has a lower lip and jaw tremor. She has an intermittent head tremor. Head tremor may be slightly worse than last year. Airway examination reveals mild to moderate dryness, otherwise stable findings.  Tongue protrudes centrally and palate elevates symmetrically.   Chest:Clear to auscultation without wheezing, rhonchi or crackles noted.  Heart:S1+S2+0, regular and normal without murmurs, rubs or gallops noted.   Abdomen:Soft, non-tender and non-distended with normal bowel sounds appreciated on auscultation.  Extremities:There is no obvious change.   Skin: Warm and dry without trophic changes noted.  Musculoskeletal: exam reveals no obvious joint deformities.   Neurologically:  Mental status: The patient is awake, alert and oriented in all 4 spheres. Her immediate and remote memory, attention, language skills and fund of knowledge are appropriate. There is no evidence of aphasia, agnosia, apraxia or anomia. Speech is clear with normal prosody and enunciation. Thought process is linear. Mood is normal and affect is normal.  Cranial nerves II - XII are as described above under HEENT exam.  Motor exam: Normal bulk, no resting tremor. She has a mild bilateral upper extremity hand tremor, minimal action tremor, no intention tremor. Romberg is negative. Fine motor skills and coordination: grossly intact for age.   Cerebellar testing: No dysmetria or intention tremor on finger to nose testing.  Sensory exam: intact to light touch in the upper and lower extremities.  Gait, station and balance: She stands easily. No veering to one side is noted. No leaning to one side is noted. Posture is age-appropriate and stance is narrow based. Gait shows normal stride length and normal pace. No problems turning are noted. She has preserved arm swing when walking. Tandem walk is slightly  challenging for her, but okay for age or.   Assessment and Plan:   In summary, SHAHLA BETSILL is a very pleasant 77 year old female with an underlying  medical history of asthma, allergies, reflux disease, hypothyroidism, and obesity, who presents for follow-up consultation of her long-standing history of essential tremor. She has a family history of tremors. Findings are fairly stable. She feels reasonably stable. Sleep deprivation, stress and caffeine are her typical triggers. I did not suggest any new medication today. I can monitor her clinically. Of note, because of her asthma she is not a good candidate for beta blocker, we could consider a small dose of Mysoline in the future. She will FU in one year.  I spent 20 minutes in total face-to-face time with the patient, more than 50% of which was spent in counseling and coordination of care, reviewing test results, reviewing medication and discussing or reviewing the diagnosis of ET, its prognosis and treatment options. Pertinent laboratory and imaging test results that were available during this visit with the patient were reviewed by me and considered in my medical decision making (see chart for details).

## 2018-03-17 NOTE — Patient Instructions (Addendum)
Your exam and symptoms are stable.  Please stay well hydrated.  Limit caffeine as it can trigger tremor to be worse.  Please stay active.

## 2018-03-24 ENCOUNTER — Other Ambulatory Visit: Payer: Self-pay | Admitting: Internal Medicine

## 2018-04-05 ENCOUNTER — Telehealth: Payer: Self-pay

## 2018-04-05 MED ORDER — ZOSTER VAC RECOMB ADJUVANTED 50 MCG/0.5ML IM SUSR: 0.5000 mL | Freq: Once | INTRAMUSCULAR | 1 refills | Status: AC

## 2018-04-11 NOTE — Telephone Encounter (Signed)
error 

## 2018-05-03 ENCOUNTER — Encounter: Payer: Self-pay | Admitting: Obstetrics & Gynecology

## 2018-05-03 ENCOUNTER — Ambulatory Visit (INDEPENDENT_AMBULATORY_CARE_PROVIDER_SITE_OTHER): Payer: Medicare Other | Admitting: Obstetrics & Gynecology

## 2018-05-03 VITALS — BP 128/72 | Ht 67.0 in | Wt 191.0 lb

## 2018-05-03 DIAGNOSIS — Z780 Asymptomatic menopausal state: Secondary | ICD-10-CM

## 2018-05-03 DIAGNOSIS — N8110 Cystocele, unspecified: Secondary | ICD-10-CM

## 2018-05-03 DIAGNOSIS — Z01419 Encounter for gynecological examination (general) (routine) without abnormal findings: Secondary | ICD-10-CM

## 2018-05-03 DIAGNOSIS — R300 Dysuria: Secondary | ICD-10-CM

## 2018-05-03 DIAGNOSIS — Z90710 Acquired absence of both cervix and uterus: Secondary | ICD-10-CM

## 2018-05-03 MED ORDER — CIPROFLOXACIN HCL 500 MG PO TABS: 500.0000 mg | ORAL_TABLET | Freq: Two times a day (BID) | ORAL | 0 refills | Status: AC

## 2018-05-03 NOTE — Progress Notes (Signed)
Melissa Lowe May 11, 1941 [id number]   History:    77 y.o. G0 Married.  Enjoys traveling with husband.  RP:  Established patient presenting for annual gyn exam   HPI:  S/P Total Hysterectomy.  Menopause, well on no HRT.  No pelvic pain.  Well with pessary Milex ring #3 with support for a cystocele grade 2/4 and rectocele grade 1/3.  No stress urinary incontinence.  Burning with micturition for the last 2 days.  Normal vaginal secretions.  Breasts normal.  Body mass index 29.91.  Health labs with family physician.  Has essential tremor followed by neurology.  Past medical history,surgical history, family history and social history were all reviewed and documented in the EPIC chart.  Gynecologic History No LMP recorded. Patient is postmenopausal. Contraception: status post hysterectomy Last Pap: 03/2017. Results were: Negative Last mammogram: 10/2017. Results were: Negative Bone Density: 05/2017 Normal Colonoscopy: 12/2013  Obstetric History OB History  Gravida Para Term Preterm AB Living  0 0 0 0 0 0  SAB TAB Ectopic Multiple Live Births  0 0 0 0 0     ROS: A ROS was performed and pertinent positives and negatives are included in the history.  GENERAL: No fevers or chills. HEENT: No change in vision, no earache, sore throat or sinus congestion. NECK: No pain or stiffness. CARDIOVASCULAR: No chest pain or pressure. No palpitations. PULMONARY: No shortness of breath, cough or wheeze. GASTROINTESTINAL: No abdominal pain, nausea, vomiting or diarrhea, melena or bright red blood per rectum. GENITOURINARY: No urinary frequency, urgency, hesitancy or dysuria. MUSCULOSKELETAL: No joint or muscle pain, no back pain, no recent trauma. DERMATOLOGIC: No rash, no itching, no lesions. ENDOCRINE: No polyuria, polydipsia, no heat or cold intolerance. No recent change in weight. HEMATOLOGICAL: No anemia or easy bruising or bleeding. NEUROLOGIC: No headache, seizures, numbness, tingling or weakness.  PSYCHIATRIC: No depression, no loss of interest in normal activity or change in sleep pattern.     Exam:   BP 128/72 (BP Location: Right Arm, Patient Position: Sitting, Cuff Size: Normal)   Ht 5\' 7"  (1.702 m)   Wt 191 lb (86.6 kg)   BMI 29.91 kg/m   Body mass index is 29.91 kg/m.  General appearance : Well developed well nourished female. No acute distress HEENT: Eyes: no retinal hemorrhage or exudates,  Neck supple, trachea midline, no carotid bruits, no thyroidmegaly Lungs: Clear to auscultation, no rhonchi or wheezes, or rib retractions  Heart: Regular rate and rhythm, no murmurs or gallops Breast:Examined in sitting and supine position were symmetrical in appearance, no palpable masses or tenderness,  no skin retraction, no nipple inversion, no nipple discharge, no skin discoloration, no axillary or supraclavicular lymphadenopathy Abdomen: no palpable masses or tenderness, no rebound or guarding Extremities: no edema or skin discoloration or tenderness  Pelvic: Vulva: Normal             Vagina: No gross lesions or discharge.  Cystocele grade 2/4 and rectocele grade 1/3.  Pessary removed, cleaned and put back in place.  Cervix/Uterus absent  Adnexa  Without masses or tenderness  Anus: Normal  U/A: Yellow cloudy, nitrites negative, white blood cells more than 60, red blood cells 0-2, many bacteria.  Urine culture pending.   Assessment/Plan:  77 y.o. female for annual exam   1. Well female exam with routine gynecological exam Gynecologic exam status post hysterectomy and menopause.  Pap test negative in June 2018.  No need to repeat Pap this year.  Breast exam  normal.  Mammogram negative in January 2019.  Colonoscopy in 2015.  Fasting health labs with family physician.  2. Hx of total hysterectomy  3. Menopause present Well on no hormone replacement therapy.  Last bone density August 2018 was completely normal.  Continue with vitamin D supplements, calcium rich nutrition and  regular weightbearing physical activity.  4. Baden-Walker grade 2 cystocele Symptoms completely controlled with pessary Milex ring #3 with support.  Pessary cleaned and put back in place today.  5. Dysuria Urine analysis compatible with an acute cystitis.  Patient is allergic to sulfa medication.  Decision to treat with Cipro which patient has tolerated well in the past.  Pending urine culture.  Usage, risks and benefits of Cipro reviewed and prescription sent to pharmacy.  - Urinalysis,Complete w/RFL Culture  Other orders - ciprofloxacin (CIPRO) 500 MG tablet; Take 1 tablet (500 mg total) by mouth 2 (two) times daily for 7 days.  Counseling on above issues and coordination of care more than 50% for 10 minutes.  Princess Bruins MD, 10:18 AM 05/03/2018

## 2018-05-03 NOTE — Patient Instructions (Addendum)
1. Well female exam with routine gynecological exam Gynecologic exam status post hysterectomy and menopause.  Pap test negative in June 2018.  No need to repeat Pap this year.  Breast exam normal.  Mammogram negative in January 2019.  Colonoscopy in 2015.  Fasting health labs with family physician.  2. Hx of total hysterectomy  3. Menopause present Well on no hormone replacement therapy.  Last bone density August 2018 was completely normal.  Continue with vitamin D supplements, calcium rich nutrition and regular weightbearing physical activity.  4. Baden-Walker grade 2 cystocele Symptoms completely controlled with pessary Milex ring #3 with support.  Pessary cleaned and put back in place today.  5. Dysuria Urine analysis compatible with an acute cystitis.  Patient is allergic to sulfa medication.  Decision to treat with Cipro which patient has tolerated well in the past.  Pending urine culture.  Usage, risks and benefits of Cipro reviewed and prescription sent to pharmacy.  - Urinalysis,Complete w/RFL Culture  Other orders - ciprofloxacin (CIPRO) 500 MG tablet; Take 1 tablet (500 mg total) by mouth 2 (two) times daily for 7 days.  [name], it was a pleasure seeing you today!  I will inform you of your results as soon as they are available.

## 2018-05-06 LAB — URINALYSIS, COMPLETE W/RFL CULTURE
BILIRUBIN URINE: NEGATIVE
GLUCOSE, UA: NEGATIVE
HYALINE CAST: NONE SEEN /LPF
Ketones, ur: NEGATIVE
Nitrites, Initial: NEGATIVE
PROTEIN: NEGATIVE
Specific Gravity, Urine: 1.02 (ref 1.001–1.03)
pH: 5 (ref 5.0–8.0)

## 2018-05-06 LAB — URINE CULTURE
MICRO NUMBER: 90816625
SPECIMEN QUALITY: ADEQUATE

## 2018-05-06 LAB — CULTURE INDICATED

## 2018-06-10 ENCOUNTER — Telehealth: Payer: Self-pay | Admitting: Internal Medicine

## 2018-06-10 NOTE — Telephone Encounter (Signed)
Copied from [place] [id number]. Topic: Inquiry >> Jun 10, 2018  2:54 PM Scherrie Gerlach wrote: Reason for CRM: pt is going to be traveling overseas, and would like to inquire about the measles vaccine Does not know if she ever had the measles

## 2018-06-13 NOTE — Telephone Encounter (Signed)
If she has never had measles and has never been vaccinated she should receive the vaccine - 2 doses one month apart. As long as she has one injection prior to travel that is ok

## 2018-06-14 ENCOUNTER — Telehealth: Payer: Self-pay

## 2018-06-14 ENCOUNTER — Ambulatory Visit: Payer: Medicare Other

## 2018-06-14 NOTE — Telephone Encounter (Signed)
LVM advising pt of note below. Advised pt to set up nurse visit for vaccine.

## 2018-06-14 NOTE — Telephone Encounter (Signed)
Patient needs to get MMR vaccine for travel, she also needs to take malaria tablets---we are trying to consider the dosing of each---patient to call back with name of malara tablets

## 2018-06-15 ENCOUNTER — Ambulatory Visit: Payer: Medicare Other

## 2018-06-26 ENCOUNTER — Other Ambulatory Visit: Payer: Self-pay | Admitting: Internal Medicine

## 2018-07-03 ENCOUNTER — Other Ambulatory Visit: Payer: Self-pay | Admitting: Internal Medicine

## 2018-07-03 DIAGNOSIS — J45909 Unspecified asthma, uncomplicated: Secondary | ICD-10-CM

## 2018-07-05 NOTE — Progress Notes (Signed)
Subjective:    Patient ID: Melissa Lowe, female    DOB: Mar 18, 1941, 77 y.o.   MRN: [id number]  HPI She is here for a physical exam.   Sleeping - she is in bed by 10:30 and light is out by 11.  3/4's of the time she will fall asleep easily.  Then she gets up to go to the bathroom and has difficulty going back to sleep.  She often wakes at 2-3 and can not go back to sleep, sometimes she will go back to sleep early morning. If she sleeps on her left side she will wake up her and the same thing can happen on the right side.  She does not want to take any medication.  She is retired and feels she can handle inconsistent good sleep.  Overall she feels well and has no major concerns.   Medications and allergies reviewed with patient and updated if appropriate.  Patient Active Problem List   Diagnosis Date Noted  . Plantar fasciitis of left foot 12/29/2017  . Hypothyroidism 01/12/2017  . GERD (gastroesophageal reflux disease) 01/12/2017  . PAC (premature atrial contraction) 09/22/2016  . Fatty liver, mild 05/19/2016  . Prediabetes 05/14/2016  . Benign essential tremor 05/14/2016  . Cystocele 05/14/2016  . LIPOMA 03/10/2010  . Tinnitus 03/10/2010  . DIVERTICULOSIS, COLON 10/18/2008  . Hyperlipidemia 08/08/2007  . HIP PAIN 08/08/2007  . Asthma without acute exacerbation 08/02/2007  . Personal history of colonic polyps 08/02/2007    Current Outpatient Medications on File Prior to Visit  Medication Sig Dispense Refill  . albuterol (PROAIR HFA) 108 (90 Base) MCG/ACT inhaler Inhale 1 puff into the lungs as needed. 54 g 3  . Ascorbic Acid (VITAMIN C) 500 MG tablet Take 500 mg by mouth daily.      . bimatoprost (LATISSE) 0.03 % ophthalmic solution Place into both eyes. apply evenly along the skin of the upper eyelid at base of eyelashes 3xs weekly at bedtime; repeat procedure for second eye (use a clean applicator).    . Cholecalciferol (VITAMIN D3) 1000 UNITS CAPS Take by mouth daily.     Marland Kitchen levothyroxine (SYNTHROID, LEVOTHROID) 50 MCG tablet TAKE 1 TABLET(50 MCG) BY MOUTH EVERY AM 90 tablet 1  . montelukast (SINGULAIR) 10 MG tablet Take 1 tablet (10 mg total) by mouth daily. 90 tablet 1  . Multiple Vitamin (MULTIVITAMIN) capsule Take 1 capsule by mouth daily.      . pravastatin (PRAVACHOL) 40 MG tablet TAKE 1 TABLET AT BEDTIME 90 tablet 4  . ranitidine (ZANTAC) 75 MG tablet Take 150 mg by mouth as needed for heartburn.     . SYMBICORT 80-4.5 MCG/ACT inhaler USE 1 INHALATION DAILY 30.6 g 1   No current facility-administered medications on file prior to visit.     Past Medical History:  Diagnosis Date  . Allergy   . Asthma   . Diverticulosis   . Fibroid   . Hyperlipidemia   . Hypothyroidism   . IBS (irritable bowel syndrome)   . PAC (premature atrial contraction) 09/22/2016    Past Surgical History:  Procedure Laterality Date  . ABDOMINAL HYSTERECTOMY     2  fibroids  . ANTERIOR AND POSTERIOR REPAIR N/A 01/30/2013   Procedure:  POSTERIOR REPAIR (RECTOCELE);  Surgeon: Princess Bruins, MD;  Location: [facility];  Service: Gynecology;  Laterality: N/A;  . CATARACT EXTRACTION    . CHOLECYSTECTOMY    . colon polypectomy  2001   Dr Earlean Shawl  .  COLONOSCOPY  12/25/13   Tics , Dr Lajoyce Lauber 2020  . EYE SURGERY    . REPAIR FASCIAL DEFECT LEG  1972  . SEPTOPLASTY    . THIGH FASCIOTOMY      Social History   Socioeconomic History  . Marital status: Married    Spouse name: [name]  . Number of children: 0  . Years of education: Master's   . Highest education level: Not on file  Occupational History  . Not on file  Social Needs  . Financial resource strain: Not on file  . Food insecurity:    Worry: Not on file    Inability: Not on file  . Transportation needs:    Medical: Not on file    Non-medical: Not on file  Tobacco Use  . Smoking status: Never Smoker  . Smokeless tobacco: Never Used  Substance and Sexual Activity  . Alcohol use: Yes    Comment: Wine  couple days weekly  . Drug use: No  . Sexual activity: Yes    Partners: Male    Comment: 1ST INTERCOURSE- ?   PARTNERS- 3  Lifestyle  . Physical activity:    Days per week: Not on file    Minutes per session: Not on file  . Stress: Not on file  Relationships  . Social connections:    Talks on phone: Not on file    Gets together: Not on file    Attends religious service: Not on file    Active member of club or organization: Not on file    Attends meetings of clubs or organizations: Not on file    Relationship status: Not on file  Other Topics Concern  . Not on file  Social History Narrative   Patient lives at home with her husband [name].    Patient has a Master's Degree and post grad.    Patient has no children.    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    Family History  Problem Relation Age of Onset  . Dementia Mother   . Cancer Father   . Asthma Sister   . Breast cancer Sister   . Stroke Maternal Grandmother        in 53s  . Stroke Paternal Grandmother 89  . Cancer Maternal Grandfather   . Hypertension Neg Hx   . Heart disease Neg Hx     Review of Systems  Constitutional: Negative for chills and fever.  Eyes: Negative for visual disturbance.  Respiratory: Negative for cough, shortness of breath and wheezing.   Cardiovascular: Negative for chest pain, palpitations and leg swelling.  Gastrointestinal: Negative for abdominal pain, blood in stool, constipation, diarrhea and nausea.       Gerd controlled  Genitourinary: Negative for dysuria and hematuria.  Musculoskeletal: Positive for arthralgias (left knee pain) and back pain.  Skin: Negative for color change and rash.  Neurological: Negative for light-headedness and headaches.  Psychiatric/Behavioral: Positive for sleep disturbance. Negative for dysphoric mood. The patient is not nervous/anxious.        Objective:   Vitals:   07/06/18 0859  BP: 104/62  Pulse: 68  Resp: 16  Temp: 98.1 F  (36.7 C)  SpO2: 98%   Filed Weights   07/06/18 0859  Weight: 189 lb (85.7 kg)   Body mass index is 29.6 kg/m.  Wt Readings from Last 3 Encounters:  07/06/18 189 lb (85.7 kg)  05/03/18 191 lb (86.6 kg)  03/17/18 192 lb (87.1 kg)  Physical Exam Constitutional: She appears well-developed and well-nourished. No distress.  HENT:  Head: Normocephalic and atraumatic.  Right Ear: External ear normal. Normal ear canal and TM Left Ear: External ear normal.  Normal ear canal and TM Mouth/Throat: Oropharynx is clear and moist.  Eyes: Conjunctivae and EOM are normal.  Neck: Neck supple. No tracheal deviation present. No thyromegaly present.  No carotid bruit  Cardiovascular: Normal rate, regular rhythm and normal heart sounds.   No murmur heard.  No edema. Pulmonary/Chest: Effort normal and breath sounds normal. No respiratory distress. She has no wheezes. She has no rales.  Breast: deferred to Gyn Abdominal: Soft. She exhibits no distension. There is no tenderness.  Lymphadenopathy: She has no cervical adenopathy.  Skin: Skin is warm and dry. She is not diaphoretic.  Psychiatric: She has a normal mood and affect. Her behavior is normal.        Assessment & Plan:   Physical exam: Screening blood work  ordered Immunizations   Flu done at pharmacy,  First shingrix done at pharmacy Colonoscopy   Up to date  Mammogram   Up to date  Gyn   Up to date  Dexa     Up to date  Eye exams   Up to date  - Dr Bing Plume EKG   Done 2017 Exercise   Walking 2 days/ week for 1/5 miles Weight  Has lost weight, continue weight loss efforts Skin   Sees derm, no concerns Substance abuse   none  See Problem List for Assessment and Plan of chronic medical problems.   follow-up in 6 months

## 2018-07-05 NOTE — Patient Instructions (Addendum)
Test(s) ordered today. Your results will be released to [place] (or called to you) after review, usually within 72hours after test completion. If any changes need to be made, you will be notified at that same time.  All other Health Maintenance issues reviewed.   All recommended immunizations and age-appropriate screenings are up-to-date or discussed.  No immunizations administered today.   Medications reviewed and updated.  No changes recommended at this time.  Your prescription(s) have been submitted to your pharmacy. Please take as directed and contact our office if you believe you are having problem(s) with the medication(s).   Please followup in 6 months   Health Maintenance, Female Adopting a healthy lifestyle and getting preventive care can go a long way to promote health and wellness. Talk with your health care provider about what schedule of regular examinations is right for you. This is a good chance for you to check in with your provider about disease prevention and staying healthy. In between checkups, there are plenty of things you can do on your own. Experts have done a lot of research about which lifestyle changes and preventive measures are most likely to keep you healthy. Ask your health care provider for more information. Weight and diet Eat a healthy diet  Be sure to include plenty of vegetables, fruits, low-fat dairy products, and lean protein.  Do not eat a lot of foods high in solid fats, added sugars, or salt.  Get regular exercise. This is one of the most important things you can do for your health. ? Most adults should exercise for at least 150 minutes each week. The exercise should increase your heart rate and make you sweat (moderate-intensity exercise). ? Most adults should also do strengthening exercises at least twice a week. This is in addition to the moderate-intensity exercise.  Maintain a healthy weight  Body mass index (BMI) is a measurement that can  be used to identify possible weight problems. It estimates body fat based on height and weight. Your health care provider can help determine your BMI and help you achieve or maintain a healthy weight.  For females 13 years of age and older: ? A BMI below 18.5 is considered underweight. ? A BMI of 18.5 to 24.9 is normal. ? A BMI of 25 to 29.9 is considered overweight. ? A BMI of 30 and above is considered obese.  Watch levels of cholesterol and blood lipids  You should start having your blood tested for lipids and cholesterol at 75 years of age, then have this test every 5 years.  You may need to have your cholesterol levels checked more often if: ? Your lipid or cholesterol levels are high. ? You are older than 75 years of age. ? You are at high risk for heart disease.  Cancer screening Lung Cancer  Lung cancer screening is recommended for adults 54-15 years old who are at high risk for lung cancer because of a history of smoking.  A yearly low-dose CT scan of the lungs is recommended for people who: ? Currently smoke. ? Have quit within the past 15 years. ? Have at least a 30-pack-year history of smoking. A pack year is smoking an average of one pack of cigarettes a day for 1 year.  Yearly screening should continue until it has been 15 years since you quit.  Yearly screening should stop if you develop a health problem that would prevent you from having lung cancer treatment.  Breast Cancer  Practice breast self-awareness.  This means understanding how your breasts normally appear and feel.  It also means doing regular breast self-exams. Let your health care provider know about any changes, no matter how small.  If you are in your 20s or 30s, you should have a clinical breast exam (CBE) by a health care provider every 1-3 years as part of a regular health exam.  If you are 29 or older, have a CBE every year. Also consider having a breast X-ray (mammogram) every year.  If you  have a family history of breast cancer, talk to your health care provider about genetic screening.  If you are at high risk for breast cancer, talk to your health care provider about having an MRI and a mammogram every year.  Breast cancer gene (BRCA) assessment is recommended for women who have family members with BRCA-related cancers. BRCA-related cancers include: ? Breast. ? Ovarian. ? Tubal. ? Peritoneal cancers.  Results of the assessment will determine the need for genetic counseling and BRCA1 and BRCA2 testing.  Cervical Cancer Your health care provider may recommend that you be screened regularly for cancer of the pelvic organs (ovaries, uterus, and vagina). This screening involves a pelvic examination, including checking for microscopic changes to the surface of your cervix (Pap test). You may be encouraged to have this screening done every 3 years, beginning at age 40.  For women ages 44-65, health care providers may recommend pelvic exams and Pap testing every 3 years, or they may recommend the Pap and pelvic exam, combined with testing for human papilloma virus (HPV), every 5 years. Some types of HPV increase your risk of cervical cancer. Testing for HPV may also be done on women of any age with unclear Pap test results.  Other health care providers may not recommend any screening for nonpregnant women who are considered low risk for pelvic cancer and who do not have symptoms. Ask your health care provider if a screening pelvic exam is right for you.  If you have had past treatment for cervical cancer or a condition that could lead to cancer, you need Pap tests and screening for cancer for at least 20 years after your treatment. If Pap tests have been discontinued, your risk factors (such as having a new sexual partner) need to be reassessed to determine if screening should resume. Some women have medical problems that increase the chance of getting cervical cancer. In these cases,  your health care provider may recommend more frequent screening and Pap tests.  Colorectal Cancer  This type of cancer can be detected and often prevented.  Routine colorectal cancer screening usually begins at 75 years of age and continues through 75 years of age.  Your health care provider may recommend screening at an earlier age if you have risk factors for colon cancer.  Your health care provider may also recommend using home test kits to check for hidden blood in the stool.  A small camera at the end of a tube can be used to examine your colon directly (sigmoidoscopy or colonoscopy). This is done to check for the earliest forms of colorectal cancer.  Routine screening usually begins at age 96.  Direct examination of the colon should be repeated every 5-10 years through 75 years of age. However, you may need to be screened more often if early forms of precancerous polyps or small growths are found.  Skin Cancer  Check your skin from head to toe regularly.  Tell your health care provider about any  new moles or changes in moles, especially if there is a change in a mole's shape or color.  Also tell your health care provider if you have a mole that is larger than the size of a pencil eraser.  Always use sunscreen. Apply sunscreen liberally and repeatedly throughout the day.  Protect yourself by wearing long sleeves, pants, a wide-brimmed hat, and sunglasses whenever you are outside.  Heart disease, diabetes, and high blood pressure  High blood pressure causes heart disease and increases the risk of stroke. High blood pressure is more likely to develop in: ? People who have blood pressure in the high end of the normal range (130-139/85-89 mm Hg). ? People who are overweight or obese. ? People who are African American.  If you are 58-1 years of age, have your blood pressure checked every 3-5 years. If you are 52 years of age or older, have your blood pressure checked every year.  You should have your blood pressure measured twice-once when you are at a hospital or clinic, and once when you are not at a hospital or clinic. Record the average of the two measurements. To check your blood pressure when you are not at a hospital or clinic, you can use: ? An automated blood pressure machine at a pharmacy. ? A home blood pressure monitor.  If you are between 24 years and 65 years old, ask your health care provider if you should take aspirin to prevent strokes.  Have regular diabetes screenings. This involves taking a blood sample to check your fasting blood sugar level. ? If you are at a normal weight and have a low risk for diabetes, have this test once every three years after 76 years of age. ? If you are overweight and have a high risk for diabetes, consider being tested at a younger age or more often. Preventing infection Hepatitis B  If you have a higher risk for hepatitis B, you should be screened for this virus. You are considered at high risk for hepatitis B if: ? You were born in a country where hepatitis B is common. Ask your health care provider which countries are considered high risk. ? Your parents were born in a high-risk country, and you have not been immunized against hepatitis B (hepatitis B vaccine). ? You have HIV or AIDS. ? You use needles to inject street drugs. ? You live with someone who has hepatitis B. ? You have had sex with someone who has hepatitis B. ? You get hemodialysis treatment. ? You take certain medicines for conditions, including cancer, organ transplantation, and autoimmune conditions.  Hepatitis C  Blood testing is recommended for: ? Everyone born from [birth year] through 1963. ? Anyone with known risk factors for hepatitis C.  Sexually transmitted infections (STIs)  You should be screened for sexually transmitted infections (STIs) including gonorrhea and chlamydia if: ? You are sexually active and are younger than 75 years of  age. ? You are older than 75 years of age and your health care provider tells you that you are at risk for this type of infection. ? Your sexual activity has changed since you were last screened and you are at an increased risk for chlamydia or gonorrhea. Ask your health care provider if you are at risk.  If you do not have HIV, but are at risk, it may be recommended that you take a prescription medicine daily to prevent HIV infection. This is called pre-exposure prophylaxis (PrEP). You are considered at  risk if: ? You are sexually active and do not regularly use condoms or know the HIV status of your partner(s). ? You take drugs by injection. ? You are sexually active with a partner who has HIV.  Talk with your health care provider about whether you are at high risk of being infected with HIV. If you choose to begin PrEP, you should first be tested for HIV. You should then be tested every 3 months for as long as you are taking PrEP. Pregnancy  If you are premenopausal and you may become pregnant, ask your health care provider about preconception counseling.  If you may become pregnant, take 400 to 800 micrograms (mcg) of folic acid every day.  If you want to prevent pregnancy, talk to your health care provider about birth control (contraception). Osteoporosis and menopause  Osteoporosis is a disease in which the bones lose minerals and strength with aging. This can result in serious bone fractures. Your risk for osteoporosis can be identified using a bone density scan.  If you are 6 years of age or older, or if you are at risk for osteoporosis and fractures, ask your health care provider if you should be screened.  Ask your health care provider whether you should take a calcium or vitamin D supplement to lower your risk for osteoporosis.  Menopause may have certain physical symptoms and risks.  Hormone replacement therapy may reduce some of these symptoms and risks. Talk to your health  care provider about whether hormone replacement therapy is right for you. Follow these instructions at home:  Schedule regular health, dental, and eye exams.  Stay current with your immunizations.  Do not use any tobacco products including cigarettes, chewing tobacco, or electronic cigarettes.  If you are pregnant, do not drink alcohol.  If you are breastfeeding, limit how much and how often you drink alcohol.  Limit alcohol intake to no more than 1 drink per day for nonpregnant women. One drink equals 12 ounces of beer, 5 ounces of wine, or 1 ounces of hard liquor.  Do not use street drugs.  Do not share needles.  Ask your health care provider for help if you need support or information about quitting drugs.  Tell your health care provider if you often feel depressed.  Tell your health care provider if you have ever been abused or do not feel safe at home. This information is not intended to replace advice given to you by your health care provider. Make sure you discuss any questions you have with your health care provider. Document Released: 04/27/2011 Document Revised: 03/19/2016 Document Reviewed: 07/16/2015 Elsevier Interactive Patient Education  [employer].

## 2018-07-06 ENCOUNTER — Other Ambulatory Visit (INDEPENDENT_AMBULATORY_CARE_PROVIDER_SITE_OTHER): Payer: Medicare Other

## 2018-07-06 ENCOUNTER — Ambulatory Visit (INDEPENDENT_AMBULATORY_CARE_PROVIDER_SITE_OTHER): Payer: Medicare Other | Admitting: Internal Medicine

## 2018-07-06 ENCOUNTER — Encounter: Payer: Self-pay | Admitting: Internal Medicine

## 2018-07-06 VITALS — BP 104/62 | HR 68 | Temp 98.1°F | Resp 16 | Ht 67.0 in | Wt 189.0 lb

## 2018-07-06 DIAGNOSIS — J45909 Unspecified asthma, uncomplicated: Secondary | ICD-10-CM

## 2018-07-06 DIAGNOSIS — E7849 Other hyperlipidemia: Secondary | ICD-10-CM

## 2018-07-06 DIAGNOSIS — Z0000 Encounter for general adult medical examination without abnormal findings: Secondary | ICD-10-CM | POA: Diagnosis not present

## 2018-07-06 DIAGNOSIS — E038 Other specified hypothyroidism: Secondary | ICD-10-CM

## 2018-07-06 DIAGNOSIS — R7303 Prediabetes: Secondary | ICD-10-CM | POA: Diagnosis not present

## 2018-07-06 DIAGNOSIS — K219 Gastro-esophageal reflux disease without esophagitis: Secondary | ICD-10-CM

## 2018-07-06 LAB — CBC WITH DIFFERENTIAL/PLATELET
Basophils Absolute: 0.1 10*3/uL (ref 0.0–0.1)
Basophils Relative: 1.5 % (ref 0.0–3.0)
Eosinophils Absolute: 0.1 10*3/uL (ref 0.0–0.7)
Eosinophils Relative: 1.5 % (ref 0.0–5.0)
HCT: 40.4 % (ref 36.0–46.0)
Hemoglobin: 13.5 g/dL (ref 12.0–15.0)
Lymphocytes Relative: 33.3 % (ref 12.0–46.0)
Lymphs Abs: 2.4 10*3/uL (ref 0.7–4.0)
MCHC: 33.5 g/dL (ref 30.0–36.0)
MCV: 87.5 fl (ref 78.0–100.0)
Monocytes Absolute: 0.6 10*3/uL (ref 0.1–1.0)
Monocytes Relative: 8.1 % (ref 3.0–12.0)
Neutro Abs: 4.1 10*3/uL (ref 1.4–7.7)
Neutrophils Relative %: 55.6 % (ref 43.0–77.0)
Platelets: 304 10*3/uL (ref 150.0–400.0)
RBC: 4.62 Mil/uL (ref 3.87–5.11)
RDW: 14.1 % (ref 11.5–15.5)
WBC: 7.3 10*3/uL (ref 4.0–10.5)

## 2018-07-06 LAB — LIPID PANEL
CHOL/HDL RATIO: 3
Cholesterol: 170 mg/dL (ref 0–200)
HDL: 59.1 mg/dL (ref >39.00)
LDL Cholesterol: 84 mg/dL (ref 0–99)
NONHDL: 110.71
Triglycerides: 136 mg/dL (ref 0.0–149.0)
VLDL: 27.2 mg/dL (ref 0.0–40.0)

## 2018-07-06 LAB — COMPREHENSIVE METABOLIC PANEL
ALT: 17 U/L (ref 0–35)
AST: 13 U/L (ref 0–37)
Albumin: 4.4 g/dL (ref 3.5–5.2)
Alkaline Phosphatase: 54 U/L (ref 39–117)
BILIRUBIN TOTAL: 0.5 mg/dL (ref 0.2–1.2)
BUN: 15 mg/dL (ref 6–23)
CHLORIDE: 105 meq/L (ref 96–112)
CO2: 28 meq/L (ref 19–32)
Calcium: 10.1 mg/dL (ref 8.4–10.5)
Creatinine, Ser: 0.91 mg/dL (ref 0.40–1.20)
GFR: 64.03 mL/min (ref >60.00)
GLUCOSE: 89 mg/dL (ref 70–99)
POTASSIUM: 4.5 meq/L (ref 3.5–5.1)
Sodium: 141 mEq/L (ref 135–145)
Total Protein: 7.2 g/dL (ref 6.0–8.3)

## 2018-07-06 LAB — TSH: TSH: 3.49 u[IU]/mL (ref 0.35–4.50)

## 2018-07-06 LAB — HEMOGLOBIN A1C: Hgb A1c MFr Bld: 5.9 % (ref 4.6–6.5)

## 2018-07-06 MED ORDER — LEVOTHYROXINE SODIUM 50 MCG PO TABS: ORAL_TABLET | ORAL | 1 refills | Status: DC

## 2018-07-06 MED ORDER — PRAVASTATIN SODIUM 40 MG PO TABS: 40.0000 mg | ORAL_TABLET | Freq: Every day | ORAL | 3 refills | Status: DC

## 2018-07-06 MED ORDER — MONTELUKAST SODIUM 10 MG PO TABS: 10.0000 mg | ORAL_TABLET | Freq: Every day | ORAL | 1 refills | Status: DC

## 2018-07-06 NOTE — Assessment & Plan Note (Signed)
Check lipid panel  Continue daily statin Regular exercise and healthy diet encouraged  

## 2018-07-06 NOTE — Assessment & Plan Note (Signed)
Check tsh  Titrate med dose if needed  

## 2018-07-06 NOTE — Assessment & Plan Note (Signed)
Controlled Continue daily Zantac

## 2018-07-06 NOTE — Assessment & Plan Note (Signed)
Check a1c Low sugar / carb diet Stressed regular exercise, keeping weight down/weight loss 

## 2018-07-06 NOTE — Assessment & Plan Note (Signed)
Mild, intermittent Take Symbicort only as needed and typically once a day when she does need it Taking albuterol regularly Takes Singulair Continue current regimen-overall controlled

## 2018-07-07 ENCOUNTER — Encounter: Payer: Self-pay | Admitting: Internal Medicine

## 2018-07-14 ENCOUNTER — Telehealth: Payer: Self-pay | Admitting: Internal Medicine

## 2018-07-14 NOTE — Telephone Encounter (Signed)
Copied from [place] [id number]. Topic: Quick Communication - See Telephone Encounter >> Jul 14, 2018  9:49 AM Bea Graff, NT wrote: CRM for notification. See Telephone encounter for: 07/14/18. Maggie with [facility] calling and states that they need a CMA to call to answer some clinical questions in order to fill this pts pravastatin (PRAVACHOL) 40 MG tablet. CB#: [phone]. Ref#: [id number]

## 2018-07-14 NOTE — Telephone Encounter (Signed)
Spoke with [facility] to varify that pt takes pravastatin and not pravachol the brand name.

## 2018-08-16 DIAGNOSIS — M47817 Spondylosis without myelopathy or radiculopathy, lumbosacral region: Secondary | ICD-10-CM | POA: Insufficient documentation

## 2018-08-16 DIAGNOSIS — M545 Low back pain, unspecified: Secondary | ICD-10-CM | POA: Insufficient documentation

## 2018-08-26 HISTORY — PX: OTHER SURGICAL HISTORY: SHX169

## 2018-09-15 ENCOUNTER — Ambulatory Visit (INDEPENDENT_AMBULATORY_CARE_PROVIDER_SITE_OTHER): Payer: Medicare Other | Admitting: Obstetrics & Gynecology

## 2018-09-15 ENCOUNTER — Encounter: Payer: Self-pay | Admitting: Obstetrics & Gynecology

## 2018-09-15 VITALS — BP 126/88

## 2018-09-15 DIAGNOSIS — Z4689 Encounter for fitting and adjustment of other specified devices: Secondary | ICD-10-CM | POA: Diagnosis not present

## 2018-09-15 NOTE — Progress Notes (Signed)
    Melissa Lowe 20-Jan-1941 [id number]        77 y.o.  G0P0000 Married  RP: Pessary maintenance  HPI: Very well on Milex ring #3 with support.  No abnormal vaginal discharge.  No vaginal bleeding.  No pelvic pain or vaginal pain.  Urine and bowel movements normal.   OB History  Gravida Para Term Preterm AB Living  0 0 0 0 0 0  SAB TAB Ectopic Multiple Live Births  0 0 0 0 0    Past medical history,surgical history, problem list, medications, allergies, family history and social history were all reviewed and documented in the EPIC chart.   Directed ROS with pertinent positives and negatives documented in the history of present illness/assessment and plan.  Exam:  Vitals:   09/15/18 0947  BP: 126/88   General appearance:  Normal   Gynecologic exam: Pessary removed easily and cleaned.  Vaginal mucosa intact. Normal vaginal secretions. Pessary put back in place.   Assessment/Plan:  77 y.o. G0P0000   1. Encounter for pessary maintenance Doing very well with Milex ring #3 with support.  Well-tolerated and no complication.  Follow-up in 4 months for pessary maintenance.  Counseling on above issues and coordination of care more than 50% for 15 minutes.  Princess Bruins MD, 9:58 AM 09/15/2018

## 2018-09-15 NOTE — Patient Instructions (Signed)
1. Encounter for pessary maintenance Doing very well with Milex ring #3 with support.  Well-tolerated and no complication.  Follow-up in 4 months for pessary maintenance.  [name], good seeing you today!

## 2018-10-24 ENCOUNTER — Other Ambulatory Visit: Payer: Self-pay | Admitting: Obstetrics & Gynecology

## 2018-10-24 DIAGNOSIS — Z1231 Encounter for screening mammogram for malignant neoplasm of breast: Secondary | ICD-10-CM

## 2018-10-26 HISTORY — PX: KNEE SURGERY: SHX244

## 2018-11-03 ENCOUNTER — Telehealth: Payer: Self-pay

## 2018-11-03 NOTE — Telephone Encounter (Signed)
I simple mas that we use in the office would probably protect her from most respiratory particles.  There may be something more advanced but I can not advise on any specific type

## 2018-11-03 NOTE — Telephone Encounter (Signed)
Copied from [place] [id number]. Topic: General - Other >> Nov 03, 2018 11:01 AM Carolyn Stare wrote:  Pt said she is going to [place] and since there is smoke in the air she is asking if Dr Quay Burow can recommend a mask or a resp that she can wear. She said she has asthma . Would there be any change in the frequency of her medication

## 2018-11-04 ENCOUNTER — Other Ambulatory Visit: Payer: Self-pay

## 2018-11-04 MED ORDER — ALBUTEROL SULFATE HFA 108 (90 BASE) MCG/ACT IN AERS: 1.0000 | INHALATION_SPRAY | RESPIRATORY_TRACT | 0 refills | Status: DC | PRN

## 2018-11-04 NOTE — Telephone Encounter (Signed)
Spoke with pt in regards to the response below. Pt understood.

## 2018-12-13 ENCOUNTER — Ambulatory Visit: Payer: Medicare Other | Admitting: Internal Medicine

## 2018-12-13 ENCOUNTER — Ambulatory Visit (INDEPENDENT_AMBULATORY_CARE_PROVIDER_SITE_OTHER)
Admission: RE | Admit: 2018-12-13 | Discharge: 2018-12-13 | Disposition: A | Discharge status: Home / Self Care | Payer: Medicare Other | Source: Ambulatory Visit | Attending: Internal Medicine | Admitting: Internal Medicine

## 2018-12-13 ENCOUNTER — Other Ambulatory Visit (INDEPENDENT_AMBULATORY_CARE_PROVIDER_SITE_OTHER): Payer: Medicare Other

## 2018-12-13 ENCOUNTER — Encounter: Payer: Self-pay | Admitting: Internal Medicine

## 2018-12-13 DIAGNOSIS — R197 Diarrhea, unspecified: Secondary | ICD-10-CM

## 2018-12-13 DIAGNOSIS — J45909 Unspecified asthma, uncomplicated: Secondary | ICD-10-CM | POA: Diagnosis not present

## 2018-12-13 DIAGNOSIS — K219 Gastro-esophageal reflux disease without esophagitis: Secondary | ICD-10-CM | POA: Diagnosis not present

## 2018-12-13 LAB — URINALYSIS, ROUTINE W REFLEX MICROSCOPIC
Nitrite: NEGATIVE
Specific Gravity, Urine: >=1.03 — AB (ref 1.000–1.030)
Total Protein, Urine: 30 — AB
Urine Glucose: NEGATIVE
Urobilinogen, UA: 1 (ref 0.0–1.0)
pH: 5 (ref 5.0–8.0)

## 2018-12-13 LAB — CBC WITH DIFFERENTIAL/PLATELET
BASOS PCT: 0.8 % (ref 0.0–3.0)
Basophils Absolute: 0.1 10*3/uL (ref 0.0–0.1)
EOS ABS: 0.1 10*3/uL (ref 0.0–0.7)
Eosinophils Relative: 0.7 % (ref 0.0–5.0)
HCT: 45.5 % (ref 36.0–46.0)
Hemoglobin: 15.1 g/dL — ABNORMAL HIGH (ref 12.0–15.0)
Lymphocytes Relative: 15.7 % (ref 12.0–46.0)
Lymphs Abs: 1.1 10*3/uL (ref 0.7–4.0)
MCHC: 33.3 g/dL (ref 30.0–36.0)
MCV: 89.4 fl (ref 78.0–100.0)
MONOS PCT: 9.6 % (ref 3.0–12.0)
Monocytes Absolute: 0.7 10*3/uL (ref 0.1–1.0)
Neutro Abs: 5.2 10*3/uL (ref 1.4–7.7)
Neutrophils Relative %: 73.2 % (ref 43.0–77.0)
PLATELETS: 271 10*3/uL (ref 150.0–400.0)
RBC: 5.09 Mil/uL (ref 3.87–5.11)
RDW: 14.4 % (ref 11.5–15.5)
WBC: 7.1 10*3/uL (ref 4.0–10.5)

## 2018-12-13 LAB — BASIC METABOLIC PANEL
BUN: 15 mg/dL (ref 6–23)
CO2: 25 mEq/L (ref 19–32)
Calcium: 10.2 mg/dL (ref 8.4–10.5)
Chloride: 104 mEq/L (ref 96–112)
Creatinine, Ser: 1.05 mg/dL (ref 0.40–1.20)
GFR: 51.02 mL/min — AB (ref >60.00)
Glucose, Bld: 99 mg/dL (ref 70–99)
Potassium: 4.2 mEq/L (ref 3.5–5.1)
Sodium: 139 mEq/L (ref 135–145)

## 2018-12-13 LAB — HEPATIC FUNCTION PANEL
ALT: 13 U/L (ref 0–35)
AST: 16 U/L (ref 0–37)
Albumin: 4.5 g/dL (ref 3.5–5.2)
Alkaline Phosphatase: 51 U/L (ref 39–117)
Bilirubin, Direct: 0.1 mg/dL (ref 0.0–0.3)
Total Bilirubin: 0.6 mg/dL (ref 0.2–1.2)
Total Protein: 7.3 g/dL (ref 6.0–8.3)

## 2018-12-13 LAB — LIPASE: Lipase: 8 U/L — ABNORMAL LOW (ref 11.0–59.0)

## 2018-12-13 MED ORDER — DIPHENOXYLATE-ATROPINE 2.5-0.025 MG PO TABS: 1.0000 | ORAL_TABLET | Freq: Four times a day (QID) | ORAL | 0 refills | Status: DC | PRN

## 2018-12-13 NOTE — Progress Notes (Signed)
Subjective:    Patient ID: Melissa Lowe, female    DOB: 14-May-1941, 78 y.o.   MRN: [id number]  HPI   Here after just back from [place], flew [place] to [place], then [place], [place], then on to [place] in [place], then [place], then flew back through [place], then [place] airport then [employer].  Had thermal scanning every day by the ship workers, and then airports.  No other passengers on ship or planes without obvious exposure.  Now with 1 days onset (a day before the wife) of bloating, carmpy abd pain, diarrhea with watery stools, without n/v, blood or high fevers.  Diarrhea starting about 8 am Mon yesterday, some ST, low grade temp at home 99.5, no cough, sinus congestion, cough, no wheezing, sob  Pain is crampy, watery for the most part, dark and then clearer but no blood.  No n/v.  Felt chilled this am.   Ftaigued but also some jet leg, had some sleep med [place] when got home;  Has traveled in the past to [place], has been for travel med purpose with antimalarials, sleep med, emycin but has not had to use those this visit.  Had appropriate immunizations.  Only had between her and the husband 2 meals with eggs and a fish meal with crab and shrimp.  Denies worsening reflux, abd pain, dysphagia Past Medical History:  Diagnosis Date  . Allergy   . Asthma   . Diverticulosis   . Fibroid   . Hyperlipidemia   . Hypothyroidism   . IBS (irritable bowel syndrome)   . PAC (premature atrial contraction) 09/22/2016   Past Surgical History:  Procedure Laterality Date  . ABDOMINAL HYSTERECTOMY     2  fibroids  . ANTERIOR AND POSTERIOR REPAIR N/A 01/30/2013   Procedure:  POSTERIOR REPAIR (RECTOCELE);  Surgeon: Princess Bruins, MD;  Location: [facility];  Service: Gynecology;  Laterality: N/A;  . CATARACT EXTRACTION    . CHOLECYSTECTOMY    . colon polypectomy  2001   Dr Earlean Shawl  . COLONOSCOPY  12/25/13   [place] , Dr Lajoyce Lauber 2020  . EYE SURGERY    . REPAIR FASCIAL DEFECT LEG  1972  .  SEPTOPLASTY    . THIGH FASCIOTOMY      reports that she has never smoked. She has never used smokeless tobacco. She reports current alcohol use. She reports that she does not use drugs. family history includes Asthma in her sister; Breast cancer in her sister; Cancer in her father and maternal grandfather; Dementia in her mother; Stroke in her maternal grandmother; Stroke (age of onset: 60) in her paternal grandmother. Allergies  Allergen Reactions  . Sulfonamide Derivatives     Rash on inside of arms Because of a history of documented adverse serious drug reaction;Medi Alert bracelet  is recommended  . Tetracycline     Severe itching on lower leg   Current Outpatient Medications on File Prior to Visit  Medication Sig Dispense Refill  . albuterol (PROAIR HFA) 108 (90 Base) MCG/ACT inhaler Inhale 1 puff into the lungs as needed. 54 g 0  . Ascorbic Acid (VITAMIN C) 500 MG tablet Take 500 mg by mouth daily.      . bimatoprost (LATISSE) 0.03 % ophthalmic solution Place into both eyes. apply evenly along the skin of the upper eyelid at base of eyelashes 3xs weekly at bedtime; repeat procedure for second eye (use a clean applicator).    . Cholecalciferol (VITAMIN D3) 1000 UNITS CAPS Take by  mouth daily.    Marland Kitchen levothyroxine (SYNTHROID, LEVOTHROID) 50 MCG tablet TAKE 1 TABLET(50 MCG) BY MOUTH EVERY AM 90 tablet 1  . montelukast (SINGULAIR) 10 MG tablet Take 1 tablet (10 mg total) by mouth daily. 90 tablet 1  . Multiple Vitamin (MULTIVITAMIN) capsule Take 1 capsule by mouth daily.      . pravastatin (PRAVACHOL) 40 MG tablet Take 1 tablet (40 mg total) by mouth at bedtime. 90 tablet 3  . SYMBICORT 80-4.5 MCG/ACT inhaler USE 1 INHALATION DAILY 30.6 g 1   No current facility-administered medications on file prior to visit.    Review of Systems  Constitutional: Negative for other unusual diaphoresis or sweats HENT: Negative for ear discharge or swelling Eyes: Negative for other worsening visual  disturbances Respiratory: Negative for stridor or other swelling  Gastrointestinal: Negative for worsening distension or other blood Genitourinary: Negative for retention or other urinary change Musculoskeletal: Negative for other MSK pain or swelling Skin: Negative for color change or other new lesions Neurological: Negative for worsening tremors and other numbness  Psychiatric/Behavioral: Negative for worsening agitation or other fatigue All other system neg per pt    Objective:   Physical Exam BP 110/62   Pulse 64   Temp 98.4 F (36.9 C) (Oral)   Ht 5\' 7"  (1.702 m)   Wt 185 lb (83.9 kg)   SpO2 98%   BMI 28.98 kg/m  VS noted,  Constitutional: Pt appears in NAD HENT: Head: NCAT.  Right Ear: External ear normal.  Left Ear: External ear normal.  Eyes: . Pupils are equal, round, and reactive to light. Conjunctivae and EOM are normal Nose: without d/c or deformity Neck: Neck supple. Gross normal ROM Cardiovascular: Normal rate and regular rhythm.   Pulmonary/Chest: Effort normal and breath sounds without rales or wheezing.  Abd:  Soft, NT, ND, + BS, no organomegaly - benign Neurological: Pt is alert. At baseline orientation, motor grossly intact Skin: Skin is warm. No rashes, other new lesions, no LE edema Psychiatric: Pt behavior is normal without agitation  No other exam findings     Assessment & Plan:

## 2018-12-13 NOTE — Assessment & Plan Note (Signed)
Etiology unclear, for labs, xray, GI panel,  to f/u any worsening symptoms or concerns

## 2018-12-13 NOTE — Patient Instructions (Signed)
Please take all new medication as prescribed - the lomotil if needed for diarrhea (though you might try the OTC immodium as well if needed)  Please continue all other medications as before, and refills have been done if requested.  Please have the pharmacy call with any other refills you may need.  Please keep your appointments with your specialists as you may have planned  Please go to the XRAY Department in the Basement (go straight as you get off the elevator) for the x-ray testing  Please go to the LAB in the Basement (turn left off the elevator) for the tests to be done today  You will be contacted by phone if any changes need to be made immediately.  Otherwise, you will receive a letter about your results with an explanation, but please check with MyChart first.  Please remember to sign up for MyChart if you have not done so, as this will be important to you in the future with finding out test results, communicating by private email, and scheduling acute appointments online when needed.

## 2018-12-13 NOTE — Assessment & Plan Note (Signed)
stable overall by history and exam, recent data reviewed with pt, and pt to continue medical treatment as before,  to f/u any worsening symptoms or concerns  

## 2018-12-14 ENCOUNTER — Ambulatory Visit
Admission: RE | Admit: 2018-12-14 | Discharge: 2018-12-14 | Disposition: A | Discharge status: Home / Self Care | Payer: Medicare Other | Source: Ambulatory Visit | Attending: Obstetrics & Gynecology | Admitting: Obstetrics & Gynecology

## 2018-12-14 DIAGNOSIS — Z1231 Encounter for screening mammogram for malignant neoplasm of breast: Secondary | ICD-10-CM

## 2018-12-19 ENCOUNTER — Other Ambulatory Visit: Payer: Medicare Other

## 2018-12-19 DIAGNOSIS — R197 Diarrhea, unspecified: Secondary | ICD-10-CM

## 2018-12-20 ENCOUNTER — Ambulatory Visit: Payer: Medicare Other | Admitting: Internal Medicine

## 2018-12-20 ENCOUNTER — Other Ambulatory Visit (INDEPENDENT_AMBULATORY_CARE_PROVIDER_SITE_OTHER): Payer: Medicare Other

## 2018-12-20 ENCOUNTER — Encounter: Payer: Self-pay | Admitting: Internal Medicine

## 2018-12-20 VITALS — BP 114/72 | HR 74 | Temp 99.2°F | Resp 16 | Ht 67.0 in | Wt 185.0 lb

## 2018-12-20 DIAGNOSIS — J4541 Moderate persistent asthma with (acute) exacerbation: Secondary | ICD-10-CM

## 2018-12-20 DIAGNOSIS — E038 Other specified hypothyroidism: Secondary | ICD-10-CM | POA: Diagnosis not present

## 2018-12-20 DIAGNOSIS — J45901 Unspecified asthma with (acute) exacerbation: Secondary | ICD-10-CM | POA: Insufficient documentation

## 2018-12-20 DIAGNOSIS — K219 Gastro-esophageal reflux disease without esophagitis: Secondary | ICD-10-CM | POA: Diagnosis not present

## 2018-12-20 DIAGNOSIS — E7849 Other hyperlipidemia: Secondary | ICD-10-CM

## 2018-12-20 DIAGNOSIS — J069 Acute upper respiratory infection, unspecified: Secondary | ICD-10-CM | POA: Insufficient documentation

## 2018-12-20 DIAGNOSIS — R7303 Prediabetes: Secondary | ICD-10-CM | POA: Diagnosis not present

## 2018-12-20 DIAGNOSIS — R197 Diarrhea, unspecified: Secondary | ICD-10-CM

## 2018-12-20 LAB — LIPID PANEL
CHOLESTEROL: 128 mg/dL (ref 0–200)
HDL: 49.7 mg/dL (ref >39.00)
LDL Cholesterol: 50 mg/dL (ref 0–99)
NonHDL: 78.42
Total CHOL/HDL Ratio: 3
Triglycerides: 140 mg/dL (ref 0.0–149.0)
VLDL: 28 mg/dL (ref 0.0–40.0)

## 2018-12-20 LAB — GASTROINTESTINAL PATHOGEN PANEL PCR
C. DIFFICILE TOX A/B, PCR: NOT DETECTED
Campylobacter, PCR: NOT DETECTED
Cryptosporidium, PCR: NOT DETECTED
E coli (ETEC) LT/ST PCR: NOT DETECTED
E coli (STEC) stx1/stx2, PCR: NOT DETECTED
E coli 0157, PCR: NOT DETECTED
Giardia lamblia, PCR: NOT DETECTED
Norovirus, PCR: NOT DETECTED
ROTAVIRUS, PCR: NOT DETECTED
Salmonella, PCR: NOT DETECTED
Shigella, PCR: NOT DETECTED

## 2018-12-20 LAB — COMPREHENSIVE METABOLIC PANEL
ALT: 12 U/L (ref 0–35)
AST: 13 U/L (ref 0–37)
Albumin: 4.2 g/dL (ref 3.5–5.2)
Alkaline Phosphatase: 51 U/L (ref 39–117)
BUN: 10 mg/dL (ref 6–23)
CO2: 25 mEq/L (ref 19–32)
Calcium: 9.5 mg/dL (ref 8.4–10.5)
Chloride: 105 mEq/L (ref 96–112)
Creatinine, Ser: 0.88 mg/dL (ref 0.40–1.20)
GFR: 62.55 mL/min (ref >60.00)
Glucose, Bld: 89 mg/dL (ref 70–99)
Potassium: 3.7 mEq/L (ref 3.5–5.1)
Sodium: 140 mEq/L (ref 135–145)
Total Bilirubin: 0.5 mg/dL (ref 0.2–1.2)
Total Protein: 6.8 g/dL (ref 6.0–8.3)

## 2018-12-20 LAB — HEMOGLOBIN A1C: Hgb A1c MFr Bld: 5.7 % (ref 4.6–6.5)

## 2018-12-20 LAB — TSH: TSH: 2.56 u[IU]/mL (ref 0.35–4.50)

## 2018-12-20 MED ORDER — ALBUTEROL SULFATE 108 (90 BASE) MCG/ACT IN AEPB: 1.0000 | INHALATION_SPRAY | Freq: Four times a day (QID) | RESPIRATORY_TRACT | 3 refills | Status: DC | PRN

## 2018-12-20 MED ORDER — LEVOTHYROXINE SODIUM 50 MCG PO TABS: ORAL_TABLET | ORAL | 1 refills | Status: DC

## 2018-12-20 MED ORDER — BUDESONIDE-FORMOTEROL FUMARATE 80-4.5 MCG/ACT IN AERO: INHALATION_SPRAY | RESPIRATORY_TRACT | 3 refills | Status: DC

## 2018-12-20 MED ORDER — MONTELUKAST SODIUM 10 MG PO TABS: 10.0000 mg | ORAL_TABLET | Freq: Every day | ORAL | 1 refills | Status: DC

## 2018-12-20 NOTE — Assessment & Plan Note (Signed)
Mild asthma exacerbation Taking Symbicort once daily-increase to twice daily Use albuterol as needed We will hold off on steroids for now, but she will call if her symptoms worsen Likely related to a URI-viral in nature.  Will hold off on antibiotics unless symptoms worsen

## 2018-12-20 NOTE — Assessment & Plan Note (Signed)
Clinically euthyroid Check tsh  Titrate med dose if needed  

## 2018-12-20 NOTE — Assessment & Plan Note (Signed)
No longer taking zantac Taking pepcid irregularly and tums as needed She is compliant with a gerd diet

## 2018-12-20 NOTE — Assessment & Plan Note (Signed)
Check a1c Low sugar / carb diet Stressed regular exercise   

## 2018-12-20 NOTE — Assessment & Plan Note (Signed)
Had resolved after eating a brat diet, but restarted after introducing probably too much vegetables Will revise her diet Did submit stool studies-pending Likely viral in nature

## 2018-12-20 NOTE — Patient Instructions (Signed)
  Tests ordered today. Your results will be released to MyChart (or called to you) after review, usually within 72hours after test completion. If any changes need to be made, you will be notified at that same time.  Medications reviewed and updated.  Changes include :   none      Please followup in 6 months   

## 2018-12-20 NOTE — Progress Notes (Signed)
Subjective:    Patient ID: Melissa Lowe, female    DOB: 09/19/1941, 78 y.o.   MRN: [id number]  HPI The patient is here for follow up.  Cold symptoms/asthma: She was seen last week after returning from a trip to [place].  Her husband both got sick at the end of the trip.  She was experiencing diarrhea with bloating, abdominal pain.  She had some low-grade temperatures and mild cold symptoms.  Her diarrhea has not  resolved, but is not as severe.  She was eating a BRAT diet initially, but then stopped and ate more fiber, probably too much and her diarrhea returned.  She is now experiencing cold symptoms.   She is wheezing at night when going to bed.  She is using her albuterol and it helps.  She typically only uses the albuterol once a year, so she was concerned she needs it more now.  She is taking symbicort once a day.  Her cough is mixed - she is having a productive cough at times, but mostly it is dry.  Prediabetes:  She is compliant with a low sugar/carbohydrate diet.  She is exercising regularly - walking.  Hyperlipidemia: She is taking her medication daily. She is compliant with a low fat/cholesterol diet. She is exercising regularly. She denies myalgias.   Hypothyroidism:  She is taking her medication daily.  She denies any recent changes in energy or weight that are unexplained.   GERD:  She is taking her medication daily as prescribed.  She denies any GERD symptoms and feels her GERD is well controlled.   Right thumb nail has a dark area.  She noticed it about three weeks ago. It has gotten larger.  It hurts at times.  She does have a nail deformity there which is not new - the end of the nail is lifting.  She denies trauma.     Medications and allergies reviewed with patient and updated if appropriate.  Patient Active Problem List   Diagnosis Date Noted  . Asthma exacerbation 12/20/2018  . URI (upper respiratory infection) 12/20/2018  . Diarrhea 12/13/2018  . Internal  hemorrhoids 02/23/2018  . Plantar fasciitis of left foot 12/29/2017  . Hypothyroidism 01/12/2017  . GERD (gastroesophageal reflux disease) 01/12/2017  . PAC (premature atrial contraction) 09/22/2016  . Fatty liver, mild 05/19/2016  . Prediabetes 05/14/2016  . Benign essential tremor 05/14/2016  . Cystocele with prolapse 05/14/2016  . LIPOMA 03/10/2010  . Tinnitus 03/10/2010  . DIVERTICULOSIS, COLON 10/18/2008  . Hyperlipidemia 08/08/2007  . HIP PAIN 08/08/2007  . Asthma without acute exacerbation 08/02/2007  . Personal history of colonic polyps 08/02/2007    Current Outpatient Medications on File Prior to Visit  Medication Sig Dispense Refill  . Ascorbic Acid (VITAMIN C) 500 MG tablet Take 500 mg by mouth daily.      . bimatoprost (LATISSE) 0.03 % ophthalmic solution Place into both eyes. apply evenly along the skin of the upper eyelid at base of eyelashes 3xs weekly at bedtime; repeat procedure for second eye (use a clean applicator).    . Cholecalciferol (VITAMIN D3) 1000 UNITS CAPS Take by mouth daily.    . diphenoxylate-atropine (LOMOTIL) 2.5-0.025 MG tablet Take 1 tablet by mouth 4 (four) times daily as needed for diarrhea or loose stools. 30 tablet 0  . Multiple Vitamin (MULTIVITAMIN) capsule Take 1 capsule by mouth daily.      . pravastatin (PRAVACHOL) 40 MG tablet Take 1 tablet (40 mg total) by  mouth at bedtime. 90 tablet 3   No current facility-administered medications on file prior to visit.     Past Medical History:  Diagnosis Date  . Allergy   . Asthma   . Diverticulosis   . Fibroid   . Hyperlipidemia   . Hypothyroidism   . IBS (irritable bowel syndrome)   . PAC (premature atrial contraction) 09/22/2016    Past Surgical History:  Procedure Laterality Date  . ABDOMINAL HYSTERECTOMY     2  fibroids  . ANTERIOR AND POSTERIOR REPAIR N/A 01/30/2013   Procedure:  POSTERIOR REPAIR (RECTOCELE);  Surgeon: Princess Bruins, MD;  Location: [facility];  Service: Gynecology;   Laterality: N/A;  . CATARACT EXTRACTION    . CHOLECYSTECTOMY    . colon polypectomy  2001   Dr Earlean Shawl  . COLONOSCOPY  12/25/13   Tics , Dr Lajoyce Lauber 2020  . EYE SURGERY    . REPAIR FASCIAL DEFECT LEG  1972  . SEPTOPLASTY    . THIGH FASCIOTOMY      Social History   Socioeconomic History  . Marital status: Married    Spouse name: [name]  . Number of children: 0  . Years of education: Master's   . Highest education level: Not on file  Occupational History  . Not on file  Social Needs  . Financial resource strain: Not on file  . Food insecurity:    Worry: Not on file    Inability: Not on file  . Transportation needs:    Medical: Not on file    Non-medical: Not on file  Tobacco Use  . Smoking status: Never Smoker  . Smokeless tobacco: Never Used  Substance and Sexual Activity  . Alcohol use: Yes    Comment: Wine couple days weekly  . Drug use: No  . Sexual activity: Yes    Partners: Male    Comment: 1ST INTERCOURSE- ?   PARTNERS- 3  Lifestyle  . Physical activity:    Days per week: Not on file    Minutes per session: Not on file  . Stress: Not on file  Relationships  . Social connections:    Talks on phone: Not on file    Gets together: Not on file    Attends religious service: Not on file    Active member of club or organization: Not on file    Attends meetings of clubs or organizations: Not on file    Relationship status: Not on file  Other Topics Concern  . Not on file  Social History Narrative   Patient lives at home with her husband [name].    Patient has a Master's Degree and post grad.    Patient has no children.    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    Family History  Problem Relation Age of Onset  . Dementia Mother   . Cancer Father   . Asthma Sister   . Breast cancer Sister   . Stroke Maternal Grandmother        in 44s  . Stroke Paternal Grandmother 89  . Cancer Maternal Grandfather   . Hypertension Neg Hx   . Heart  disease Neg Hx     Review of Systems  Constitutional: Positive for fatigue. Negative for chills and fever.  HENT: Positive for congestion. Negative for ear pain, sinus pain and sore throat.   Respiratory: Positive for cough and wheezing. Negative for shortness of breath.   Cardiovascular: Negative for chest pain,  palpitations and leg swelling.  Gastrointestinal: Positive for abdominal pain (abdomeinal cramping resolved) and diarrhea. Negative for nausea.  Neurological: Positive for headaches. Negative for light-headedness.       Objective:   Vitals:   12/20/18 0918  BP: 114/72  Pulse: 74  Resp: 16  Temp: 99.2 F (37.3 C)  SpO2: 97%   BP Readings from Last 3 Encounters:  12/20/18 114/72  12/13/18 110/62  09/15/18 126/88   Wt Readings from Last 3 Encounters:  12/20/18 185 lb (83.9 kg)  12/13/18 185 lb (83.9 kg)  07/06/18 189 lb (85.7 kg)   Body mass index is 28.98 kg/m.   Physical Exam    GENERAL APPEARANCE: Appears stated age, well appearing, NAD EYES: conjunctiva clear, no icterus HEENT: bilateral tympanic membranes and ear canals normal, oropharynx with no erythema, no thyromegaly, trachea midline, no cervical or supraclavicular lymphadenopathy LUNGS: Mild expiratory wheeze, no crackles unlabored breathing, good air entry bilaterally CARDIOVASCULAR: Normal S1,S2 without murmurs, no edema SKIN: Warm, dry, green area in right thumb, ridges the nail and end of nail seems to be raising up, minimal tenderness in a couple of areas, no swelling       Assessment & Plan:    See Problem List for Assessment and Plan of chronic medical problems.

## 2018-12-20 NOTE — Assessment & Plan Note (Signed)
Check lipid panel  Continue daily statin Regular exercise and healthy diet encouraged  

## 2018-12-20 NOTE — Assessment & Plan Note (Signed)
Likely viral in nature Mild asthma exacerbation Symptomatic treatment Call if no improvement

## 2018-12-21 ENCOUNTER — Encounter: Payer: Self-pay | Admitting: Internal Medicine

## 2018-12-22 ENCOUNTER — Telehealth: Payer: Self-pay

## 2018-12-22 NOTE — Telephone Encounter (Signed)
Copied from [place] [id number]. Topic: General - Other >> Dec 22, 2018 12:34 PM Mcneil, Ja-Kwan wrote: Reason for CRM: Pt called to check status of lab results. Pt requests a call back.Marland Kitchen

## 2018-12-22 NOTE — Telephone Encounter (Signed)
Kidney function, liver tests, thyroid function are normal.  Cholesterol is very good.  a1c is 5.7% - just in the beginning of prediabetic range.

## 2018-12-23 NOTE — Telephone Encounter (Signed)
Pt aware of results 

## 2019-01-04 ENCOUNTER — Ambulatory Visit: Payer: Medicare Other | Admitting: Internal Medicine

## 2019-01-11 ENCOUNTER — Encounter: Payer: Self-pay | Admitting: Obstetrics & Gynecology

## 2019-01-11 ENCOUNTER — Other Ambulatory Visit: Payer: Self-pay

## 2019-01-11 ENCOUNTER — Ambulatory Visit (INDEPENDENT_AMBULATORY_CARE_PROVIDER_SITE_OTHER): Payer: Medicare Other | Admitting: Obstetrics & Gynecology

## 2019-01-11 VITALS — BP 118/68 | Wt 181.5 lb

## 2019-01-11 DIAGNOSIS — Z4689 Encounter for fitting and adjustment of other specified devices: Secondary | ICD-10-CM

## 2019-01-11 NOTE — Progress Notes (Signed)
    Melissa Lowe Jan 11, 1941 [id number]        78 y.o.  G0 married  RP: Pessary maintenance  HPI: Doing very well with pessary Milex ring #3 with support.  No pelvic or vaginal pain.  No postmenopausal bleeding.  No stress urinary incontinence.   OB History  Gravida Para Term Preterm AB Living  0 0 0 0 0 0  SAB TAB Ectopic Multiple Live Births  0 0 0 0 0    Past medical history,surgical history, problem list, medications, allergies, family history and social history were all reviewed and documented in the EPIC chart.   Directed ROS with pertinent positives and negatives documented in the history of present illness/assessment and plan.  Exam:  Vitals:   01/11/19 1133  BP: 118/68  Weight: 181 lb 7.7 oz (82.3 kg)   General appearance:  Normal  Abdomen: Normal  Gynecologic exam: Vulva normal.  Pessary removed, cleaned.  Vaginal mucosa intact with no erythema or ulceration.  No bleeding.  Pessary put back in place easily.   Assessment/Plan:  78 y.o. G0P0000   1. Encounter for pessary maintenance Doing very well with Milex ring #3 with support.  Very good fit.  The possibility of trying a smaller pessary to make the cleaning visits less painful brought up by patient, but at this time given the perfect fit otherwise, decision not to try a smaller one.  May also try without the pessary for a while to see if necessary to use all the time.  Follow-up pessary maintenance.  Counseling on above issues and coordination of care more than 50% for 15 minutes.  Princess Bruins MD, 11:48 AM 01/11/2019

## 2019-01-15 ENCOUNTER — Encounter: Payer: Self-pay | Admitting: Obstetrics & Gynecology

## 2019-01-15 NOTE — Patient Instructions (Signed)
1. Encounter for pessary maintenance Doing very well with Milex ring #3 with support.  Very good fit.  The possibility of trying a smaller pessary to make the cleaning visits less painful brought up by patient, but at this time given the perfect fit otherwise, decision not to try a smaller one.  May also try without the pessary for a while to see if necessary to use all the time.  Follow-up pessary maintenance.  Melissa Lowe, it was a pleasure seeing you today!

## 2019-01-16 ENCOUNTER — Ambulatory Visit: Payer: Medicare Other | Admitting: Obstetrics & Gynecology

## 2019-04-04 ENCOUNTER — Encounter: Payer: Self-pay | Admitting: Internal Medicine

## 2019-04-06 ENCOUNTER — Other Ambulatory Visit: Payer: Self-pay | Admitting: Internal Medicine

## 2019-04-11 ENCOUNTER — Telehealth: Payer: Self-pay | Admitting: *Deleted

## 2019-04-11 ENCOUNTER — Encounter: Payer: Self-pay | Admitting: *Deleted

## 2019-04-11 ENCOUNTER — Telehealth: Payer: Self-pay

## 2019-04-11 DIAGNOSIS — Z1159 Encounter for screening for other viral diseases: Secondary | ICD-10-CM

## 2019-04-11 NOTE — Telephone Encounter (Signed)
Patient is scheduled for colonoscopy on 04/17/19, pessary was placed on 01/11/19 visit. She checked with GI MD if pessary should be removed prior to colonoscopy and he told her to check with GYN. If yes patient will be in [place] on Thursday and can come this day to have pessary removed.  Please advise

## 2019-04-11 NOTE — Telephone Encounter (Signed)
I can order the test if she wishes.  I cannot tell her how accurate the test is because it truly is not known.  If it is positive we also do not know how much immunity that means she has.  Most likely it should be covered by her insurance-she can call them to confirm if she wishes.  Let me know if she wants me to order it and she can come to the lab when she is able.

## 2019-04-11 NOTE — Telephone Encounter (Signed)
Copied from [place] [id number]. Topic: General - Other >> Apr 11, 2019  2:39 PM Reyne Dumas L wrote: Reason for CRM:  Pt wants to know if she would be a candidate for the COVID antibody test - states that she was traveling in [place], [place], and [place] in January and came back very sick (cough and elevated temp).  They do think that they were exposed during that trip and they want to know if they carry the anti-bodies.

## 2019-04-12 ENCOUNTER — Other Ambulatory Visit: Payer: Medicare Other

## 2019-04-12 ENCOUNTER — Ambulatory Visit: Payer: Medicare Other | Admitting: Neurology

## 2019-04-12 DIAGNOSIS — Z1159 Encounter for screening for other viral diseases: Secondary | ICD-10-CM

## 2019-04-12 NOTE — Addendum Note (Signed)
Addended by: Binnie Rail on: 04/12/2019 10:55 AM   Modules accepted: Orders

## 2019-04-12 NOTE — Telephone Encounter (Signed)
Patient informed. 

## 2019-04-12 NOTE — Telephone Encounter (Signed)
ordered

## 2019-04-12 NOTE — Telephone Encounter (Signed)
Pt aware of response. Would like to go ahead and have labs done

## 2019-04-12 NOTE — Telephone Encounter (Signed)
Her pessary is very small, it shouldn't press on the rectum, I recommend to leave it in place.

## 2019-04-13 ENCOUNTER — Encounter: Payer: Self-pay | Admitting: Internal Medicine

## 2019-04-13 LAB — SAR COV2 SEROLOGY (COVID19)AB(IGG),IA: SARS CoV2 AB IGG: NEGATIVE

## 2019-05-10 ENCOUNTER — Other Ambulatory Visit: Payer: Self-pay

## 2019-05-11 ENCOUNTER — Encounter: Payer: Self-pay | Admitting: Obstetrics & Gynecology

## 2019-05-11 ENCOUNTER — Other Ambulatory Visit: Payer: Self-pay

## 2019-05-11 ENCOUNTER — Ambulatory Visit (INDEPENDENT_AMBULATORY_CARE_PROVIDER_SITE_OTHER): Payer: Medicare Other | Admitting: Obstetrics & Gynecology

## 2019-05-11 VITALS — BP 120/74 | Ht 66.25 in | Wt 186.0 lb

## 2019-05-11 DIAGNOSIS — E663 Overweight: Secondary | ICD-10-CM

## 2019-05-11 DIAGNOSIS — Z01419 Encounter for gynecological examination (general) (routine) without abnormal findings: Secondary | ICD-10-CM | POA: Diagnosis not present

## 2019-05-11 DIAGNOSIS — Z90710 Acquired absence of both cervix and uterus: Secondary | ICD-10-CM | POA: Diagnosis not present

## 2019-05-11 DIAGNOSIS — Z4689 Encounter for fitting and adjustment of other specified devices: Secondary | ICD-10-CM

## 2019-05-11 DIAGNOSIS — Z780 Asymptomatic menopausal state: Secondary | ICD-10-CM | POA: Diagnosis not present

## 2019-05-11 NOTE — Patient Instructions (Signed)
1. Well female exam with routine gynecological exam Gynecologic exam status post hysterectomy and menopause.  Pap test June 2018 was negative, no indication to repeat a Pap test this year.  Breast exam normal.  Last screening mammogram February 2020 was negative.  Health labs with family physician.  Colonoscopy 2020.  2. Hx of total hysterectomy  3. Menopause present Well on no hormone replacement therapy.  Last bone density August 2018 was completely normal.  Vitamin D supplements, calcium intake of 1200 mg daily and regular weightbearing physical activities recommended.  4. Pessary maintenance Well with a Milex ring #2 with support.  Cystocele grade 1 out of 4 and rectocele grade 1 out of 3.  Patient has done physical therapy, will continue for the next 4 months and would like to try without the pessary at follow-up in 4 months.  5. Overweight (BMI 25.0-29.9) Continue with lower calorie/carb nutrition plan, decreasing portions.  Aerobic physical activities 5 times a week and weightlifting every 2 days.  Other orders - famotidine (PEPCID) 20 MG tablet; Take 20 mg by mouth 2 (two) times daily.  Ireanna, it was a pleasure seeing you today!

## 2019-05-11 NOTE — Progress Notes (Signed)
Melissa Lowe 28-May-1941 [id number]   History:    78 y.o. G0 Married.  At [facility] since 12/2018.  RP:  Established patient presenting for annual gyn exam   HPI: Postmenopause, well on no HRT.  S/P Total Hysterectomy.  No pelvic pain.  Well with pessary, but would like to eventually try without it as she has been doing pelvic floor physical therapy.  Abstinent.  Urine and bowel movements normal.  Breasts normal.  Body mass index 29.8.  Good fitness and has decreased portions for weight loss.  Health labs with family physician.  Colonoscopy 2020.  Past medical history,surgical history, family history and social history were all reviewed and documented in the EPIC chart.  Gynecologic History No LMP recorded. Patient is postmenopausal. Contraception: status post hysterectomy Last Pap: 03/2017. Results were: Negative Last mammogram: 11/2018. Results were: Negative Bone Density: 05/2017 Normal Colonoscopy: 2020  Obstetric History OB History  Gravida Para Term Preterm AB Living  0 0 0 0 0 0  SAB TAB Ectopic Multiple Live Births  0 0 0 0 0     ROS: A ROS was performed and pertinent positives and negatives are included in the history.  GENERAL: No fevers or chills. HEENT: No change in vision, no earache, sore throat or sinus congestion. NECK: No pain or stiffness. CARDIOVASCULAR: No chest pain or pressure. No palpitations. PULMONARY: No shortness of breath, cough or wheeze. GASTROINTESTINAL: No abdominal pain, nausea, vomiting or diarrhea, melena or bright red blood per rectum. GENITOURINARY: No urinary frequency, urgency, hesitancy or dysuria. MUSCULOSKELETAL: No joint or muscle pain, no back pain, no recent trauma. DERMATOLOGIC: No rash, no itching, no lesions. ENDOCRINE: No polyuria, polydipsia, no heat or cold intolerance. No recent change in weight. HEMATOLOGICAL: No anemia or easy bruising or bleeding. NEUROLOGIC: No headache, seizures, numbness, tingling or weakness. PSYCHIATRIC: No  depression, no loss of interest in normal activity or change in sleep pattern.     Exam:   BP 120/74   Ht 5' 6.25" (1.683 m)   Wt 186 lb (84.4 kg)   BMI 29.80 kg/m   Body mass index is 29.8 kg/m.  General appearance : Well developed well nourished female. No acute distress HEENT: Eyes: no retinal hemorrhage or exudates,  Neck supple, trachea midline, no carotid bruits, no thyroidmegaly Lungs: Clear to auscultation, no rhonchi or wheezes, or rib retractions  Heart: Regular rate and rhythm, no murmurs or gallops Breast:Examined in sitting and supine position were symmetrical in appearance, no palpable masses or tenderness,  no skin retraction, no nipple inversion, no nipple discharge, no skin discoloration, no axillary or supraclavicular lymphadenopathy Abdomen: no palpable masses or tenderness, no rebound or guarding Extremities: no edema or skin discoloration or tenderness  Pelvic: Vulva: Normal             Vagina: No gross lesions or discharge.  Cystocele grade 1/4, rectocele grade 1/3.  Cervix/Uterus absent  Adnexa  Without masses or tenderness  Anus: Normal  Pessary removed, cleaned with antibacterial and put back in place.   Assessment/Plan:  78 y.o. female for annual exam   1. Well female exam with routine gynecological exam Gynecologic exam status post hysterectomy and menopause.  Pap test June 2018 was negative, no indication to repeat a Pap test this year.  Breast exam normal.  Last screening mammogram February 2020 was negative.  Health labs with family physician.  Colonoscopy 2020.  2. Hx of total hysterectomy  3. Menopause present Well on no  hormone replacement therapy.  Last bone density August 2018 was completely normal.  Vitamin D supplements, calcium intake of 1200 mg daily and regular weightbearing physical activities recommended.  4. Pessary maintenance Well with a Milex ring #2 with support.  Cystocele grade 1 out of 4 and rectocele grade 1 out of 3.   Patient has done physical therapy, will continue for the next 4 months and would like to try without the pessary at follow-up in 4 months.  5. Overweight (BMI 25.0-29.9) Continue with lower calorie/carb nutrition plan, decreasing portions.  Aerobic physical activities 5 times a week and weightlifting every 2 days.  Other orders - famotidine (PEPCID) 20 MG tablet; Take 20 mg by mouth 2 (two) times daily.  Princess Bruins MD, 10:16 AM 05/11/2019

## 2019-06-19 ENCOUNTER — Other Ambulatory Visit: Payer: Self-pay | Admitting: Internal Medicine

## 2019-06-21 ENCOUNTER — Ambulatory Visit: Payer: Medicare Other | Admitting: Internal Medicine

## 2019-07-04 NOTE — Progress Notes (Signed)
Subjective:    Patient ID: Melissa Lowe, female    DOB: 13-Jan-1941, 78 y.o.   MRN: [id number]  HPI The patient is here for follow up.  She is exercising regularly - walking on reguarly.  She has lost 16 lbs..  2 days ago she did injure her left knee.  She has seen orthopedics and will be having an MRI.  The pain has improved since she injured it.  Hyperlipidemia: She is taking her medication daily. She is compliant with a low fat/cholesterol diet. She denies myalgias.   Prediabetes:  She is compliant with a low sugar/carbohydrate diet.  She is exercising regularly.  Hypothyroidism:  She is taking her medication daily.  She denies any recent changes in energy or weight that are unexplained.   GERD:  She is taking her medication daily as prescribed.  She denies any GERD symptoms and feels her GERD is well controlled.    Medications and allergies reviewed with patient and updated if appropriate.  Patient Active Problem List   Diagnosis Date Noted  . Asthma exacerbation 12/20/2018  . URI (upper respiratory infection) 12/20/2018  . Diarrhea 12/13/2018  . Internal hemorrhoids 02/23/2018  . Plantar fasciitis of left foot 12/29/2017  . Hypothyroidism 01/12/2017  . GERD (gastroesophageal reflux disease) 01/12/2017  . PAC (premature atrial contraction) 09/22/2016  . Fatty liver, mild 05/19/2016  . Prediabetes 05/14/2016  . Benign essential tremor 05/14/2016  . Cystocele with prolapse 05/14/2016  . LIPOMA 03/10/2010  . Tinnitus 03/10/2010  . DIVERTICULOSIS, COLON 10/18/2008  . Hyperlipidemia 08/08/2007  . HIP PAIN 08/08/2007  . Asthma without acute exacerbation 08/02/2007  . Personal history of colonic polyps 08/02/2007    Current Outpatient Medications on File Prior to Visit  Medication Sig Dispense Refill  . Ascorbic Acid (VITAMIN C) 500 MG tablet Take 500 mg by mouth daily.      . bimatoprost (LATISSE) 0.03 % ophthalmic solution Place into both eyes. apply evenly  along the skin of the upper eyelid at base of eyelashes 3xs weekly at bedtime; repeat procedure for second eye (use a clean applicator).    . budesonide-formoterol (SYMBICORT) 80-4.5 MCG/ACT inhaler USE 1 INHALATION DAILY 30.6 g 3  . Cholecalciferol (VITAMIN D3) 1000 UNITS CAPS Take by mouth daily.    . diphenoxylate-atropine (LOMOTIL) 2.5-0.025 MG tablet Take 1 tablet by mouth 4 (four) times daily as needed for diarrhea or loose stools. 30 tablet 0  . ELDERBERRY PO Take by mouth.    . famotidine (PEPCID) 20 MG tablet Take 20 mg by mouth 2 (two) times daily.    Marland Kitchen levothyroxine (SYNTHROID) 50 MCG tablet TAKE 1 TABLET(50 MCG) BY MOUTH EVERY MORNING 90 tablet 1  . montelukast (SINGULAIR) 10 MG tablet TAKE 1 TABLET DAILY 90 tablet 1  . Multiple Vitamin (MULTIVITAMIN) capsule Take 1 capsule by mouth daily.      . NON FORMULARY Sleep aid for travel. Possibly Rozerem?    . pravastatin (PRAVACHOL) 40 MG tablet Take 1 tablet (40 mg total) by mouth at bedtime. 90 tablet 3   No current facility-administered medications on file prior to visit.     Past Medical History:  Diagnosis Date  . Allergy   . Asthma   . Diverticulosis   . Fibroid   . Hyperlipidemia   . Hypothyroidism   . IBS (irritable bowel syndrome)   . PAC (premature atrial contraction) 09/22/2016    Past Surgical History:  Procedure Laterality Date  . ABDOMINAL HYSTERECTOMY  2  fibroids  . ANTERIOR AND POSTERIOR REPAIR N/A 01/30/2013   Procedure:  POSTERIOR REPAIR (RECTOCELE);  Surgeon: Princess Bruins, MD;  Location: [facility];  Service: Gynecology;  Laterality: N/A;  . CATARACT EXTRACTION    . CHOLECYSTECTOMY    . colon polypectomy  2001   Dr Earlean Shawl  . COLONOSCOPY  12/25/13   Tics , Dr Lajoyce Lauber 2020  . EYE SURGERY    . REPAIR FASCIAL DEFECT LEG  1972  . SEPTOPLASTY    . THIGH FASCIOTOMY      Social History   Socioeconomic History  . Marital status: Married    Spouse name: [name]  . Number of children: 0  .  Years of education: Master's   . Highest education level: Not on file  Occupational History  . Not on file  Social Needs  . Financial resource strain: Not on file  . Food insecurity    Worry: Not on file    Inability: Not on file  . Transportation needs    Medical: Not on file    Non-medical: Not on file  Tobacco Use  . Smoking status: Never Smoker  . Smokeless tobacco: Never Used  Substance and Sexual Activity  . Alcohol use: Yes    Comment: Wine couple days weekly  . Drug use: No  . Sexual activity: Yes    Partners: Male    Comment: 1ST INTERCOURSE- ?   PARTNERS- 3  Lifestyle  . Physical activity    Days per week: Not on file    Minutes per session: Not on file  . Stress: Not on file  Relationships  . Social Herbalist on phone: Not on file    Gets together: Not on file    Attends religious service: Not on file    Active member of club or organization: Not on file    Attends meetings of clubs or organizations: Not on file    Relationship status: Not on file  Other Topics Concern  . Not on file  Social History Narrative   Patient lives at home with her husband [name].    Patient has a Master's Degree and post grad.    Patient has no children.    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    Family History  Problem Relation Age of Onset  . Dementia Mother   . Cancer Father   . Asthma Sister   . Breast cancer Sister   . Stroke Maternal Grandmother        in 41s  . Stroke Paternal Grandmother 89  . Cancer Maternal Grandfather   . Hypertension Neg Hx   . Heart disease Neg Hx     Review of Systems  Constitutional: Negative for chills and fever.  Respiratory: Negative for cough, shortness of breath and wheezing.   Cardiovascular: Negative for chest pain, palpitations and leg swelling.  Neurological: Negative for light-headedness and headaches.       Objective:   Vitals:   07/05/19 1418  BP: 116/60  Pulse: 86  Resp: 16  Temp:  98.6 F (37 C)  SpO2: 98%   BP Readings from Last 3 Encounters:  07/05/19 116/60  05/11/19 120/74  01/11/19 118/68   Wt Readings from Last 3 Encounters:  07/05/19 185 lb (83.9 kg)  05/11/19 186 lb (84.4 kg)  01/11/19 181 lb 7.7 oz (82.3 kg)   Body mass index is 29.63 kg/m.   Physical Exam  Constitutional: Appears well-developed and well-nourished. No distress.  HENT:  Head: Normocephalic and atraumatic.  Neck: Neck supple. No tracheal deviation present. No thyromegaly present.  No cervical lymphadenopathy Cardiovascular: Normal rate, regular rhythm and normal heart sounds.   No murmur heard. No carotid bruit .  No edema Pulmonary/Chest: Effort normal and breath sounds normal. No respiratory distress. No has no wheezes. No rales.  Skin: Skin is warm and dry. Not diaphoretic.  Psychiatric: Normal mood and affect. Behavior is normal.      Assessment & Plan:    See Problem List for Assessment and Plan of chronic medical problems.

## 2019-07-05 ENCOUNTER — Other Ambulatory Visit: Payer: Self-pay

## 2019-07-05 ENCOUNTER — Other Ambulatory Visit (INDEPENDENT_AMBULATORY_CARE_PROVIDER_SITE_OTHER): Payer: Medicare Other

## 2019-07-05 ENCOUNTER — Encounter: Payer: Self-pay | Admitting: Internal Medicine

## 2019-07-05 ENCOUNTER — Ambulatory Visit (INDEPENDENT_AMBULATORY_CARE_PROVIDER_SITE_OTHER): Payer: Medicare Other | Admitting: Internal Medicine

## 2019-07-05 VITALS — BP 116/60 | HR 86 | Temp 98.6°F | Resp 16 | Ht 66.25 in | Wt 185.0 lb

## 2019-07-05 DIAGNOSIS — R7303 Prediabetes: Secondary | ICD-10-CM

## 2019-07-05 DIAGNOSIS — E038 Other specified hypothyroidism: Secondary | ICD-10-CM

## 2019-07-05 DIAGNOSIS — J45909 Unspecified asthma, uncomplicated: Secondary | ICD-10-CM

## 2019-07-05 DIAGNOSIS — K219 Gastro-esophageal reflux disease without esophagitis: Secondary | ICD-10-CM

## 2019-07-05 DIAGNOSIS — E7849 Other hyperlipidemia: Secondary | ICD-10-CM | POA: Diagnosis not present

## 2019-07-05 DIAGNOSIS — Z23 Encounter for immunization: Secondary | ICD-10-CM | POA: Diagnosis not present

## 2019-07-05 LAB — COMPREHENSIVE METABOLIC PANEL
ALT: 14 U/L (ref 0–35)
AST: 15 U/L (ref 0–37)
Albumin: 4.4 g/dL (ref 3.5–5.2)
Alkaline Phosphatase: 51 U/L (ref 39–117)
BUN: 15 mg/dL (ref 6–23)
CO2: 29 mEq/L (ref 19–32)
Calcium: 10 mg/dL (ref 8.4–10.5)
Chloride: 103 mEq/L (ref 96–112)
Creatinine, Ser: 0.83 mg/dL (ref 0.40–1.20)
GFR: 66.82 mL/min (ref >60.00)
Glucose, Bld: 82 mg/dL (ref 70–99)
Potassium: 4.2 mEq/L (ref 3.5–5.1)
Sodium: 139 mEq/L (ref 135–145)
Total Bilirubin: 0.5 mg/dL (ref 0.2–1.2)
Total Protein: 7.1 g/dL (ref 6.0–8.3)

## 2019-07-05 LAB — LIPID PANEL
Cholesterol: 179 mg/dL (ref 0–200)
HDL: 56.8 mg/dL (ref >39.00)
NonHDL: 122.1
Total CHOL/HDL Ratio: 3
Triglycerides: 219 mg/dL — ABNORMAL HIGH (ref 0.0–149.0)
VLDL: 43.8 mg/dL — ABNORMAL HIGH (ref 0.0–40.0)

## 2019-07-05 LAB — CBC WITH DIFFERENTIAL/PLATELET
Basophils Absolute: 0.1 10*3/uL (ref 0.0–0.1)
Basophils Relative: 1.1 % (ref 0.0–3.0)
Eosinophils Absolute: 0.1 10*3/uL (ref 0.0–0.7)
Eosinophils Relative: 1.5 % (ref 0.0–5.0)
HCT: 39.9 % (ref 36.0–46.0)
Hemoglobin: 13.2 g/dL (ref 12.0–15.0)
Lymphocytes Relative: 33.6 % (ref 12.0–46.0)
Lymphs Abs: 2.5 10*3/uL (ref 0.7–4.0)
MCHC: 33.1 g/dL (ref 30.0–36.0)
MCV: 89 fl (ref 78.0–100.0)
Monocytes Absolute: 0.6 10*3/uL (ref 0.1–1.0)
Monocytes Relative: 8.5 % (ref 3.0–12.0)
Neutro Abs: 4.1 10*3/uL (ref 1.4–7.7)
Neutrophils Relative %: 55.3 % (ref 43.0–77.0)
Platelets: 268 10*3/uL (ref 150.0–400.0)
RBC: 4.48 Mil/uL (ref 3.87–5.11)
RDW: 14.2 % (ref 11.5–15.5)
WBC: 7.3 10*3/uL (ref 4.0–10.5)

## 2019-07-05 LAB — HEMOGLOBIN A1C: Hgb A1c MFr Bld: 5.8 % (ref 4.6–6.5)

## 2019-07-05 LAB — TSH: TSH: 3.08 u[IU]/mL (ref 0.35–4.50)

## 2019-07-05 LAB — LDL CHOLESTEROL, DIRECT: Direct LDL: 115 mg/dL

## 2019-07-05 MED ORDER — LEVOTHYROXINE SODIUM 50 MCG PO TABS: ORAL_TABLET | ORAL | 1 refills | Status: DC

## 2019-07-05 MED ORDER — MONTELUKAST SODIUM 10 MG PO TABS: 10.0000 mg | ORAL_TABLET | Freq: Every day | ORAL | 1 refills | Status: DC

## 2019-07-05 MED ORDER — PRAVASTATIN SODIUM 40 MG PO TABS: 40.0000 mg | ORAL_TABLET | Freq: Every day | ORAL | 1 refills | Status: DC

## 2019-07-05 MED ORDER — BUDESONIDE-FORMOTEROL FUMARATE 80-4.5 MCG/ACT IN AERO: INHALATION_SPRAY | RESPIRATORY_TRACT | 3 refills | Status: DC

## 2019-07-05 NOTE — Assessment & Plan Note (Signed)
Clinically euthyroid Check tsh  Titrate med dose if needed  

## 2019-07-05 NOTE — Assessment & Plan Note (Signed)
GERD controlled Continue famotidine daily

## 2019-07-05 NOTE — Patient Instructions (Addendum)
  Tests ordered today. Your results will be released to MyChart (or called to you) after review.  If any changes need to be made, you will be notified at that same time.   Flu immunization administered today.    Medications reviewed and updated.  Changes include :   none  Your prescription(s) have been submitted to your pharmacy. Please take as directed and contact our office if you believe you are having problem(s) with the medication(s).    Please followup in 6 months   

## 2019-07-05 NOTE — Assessment & Plan Note (Signed)
Check a1c Low sugar / carb diet Stressed regular exercise   

## 2019-07-05 NOTE — Assessment & Plan Note (Signed)
Asthma currently well controlled Denies any current symptoms Continue Symbicort daily Continue Singulair

## 2019-07-05 NOTE — Assessment & Plan Note (Addendum)
Check lipid panel, CMP Continue daily statin Regular exercise and healthy diet encouraged  

## 2019-07-06 ENCOUNTER — Encounter: Payer: Self-pay | Admitting: Internal Medicine

## 2019-07-06 ENCOUNTER — Ambulatory Visit: Payer: Medicare Other | Admitting: Internal Medicine

## 2019-07-07 ENCOUNTER — Telehealth: Payer: Self-pay

## 2019-07-07 DIAGNOSIS — E038 Other specified hypothyroidism: Secondary | ICD-10-CM

## 2019-07-07 NOTE — Telephone Encounter (Signed)
Copied from [place] [id number]. Topic: Quick Communication - Rx Refill/Question >> Jul 05, 2019  4:53 PM Erick Blinks wrote: Pt called to report that manufacturer must be listed as "AMNEAL" on prescription in order to receive proper refill.  Has the patient contacted their pharmacy? Yes.   (Agent: If no, request that the patient contact the pharmacy for the refill.) (Agent: If yes, when and what did the pharmacy advise?)  Preferred Pharmacy (with phone number or street name): [facility], [place] [address] [place] [address] Phone: [phone] Fax: [phone]    Agent: Please be advised that RX refills may take up to 3 business days. We ask that you follow-up with your pharmacy.

## 2019-07-10 MED ORDER — LEVOTHYROXINE SODIUM 50 MCG PO TABS: ORAL_TABLET | ORAL | 1 refills | Status: DC

## 2019-07-12 ENCOUNTER — Telehealth: Payer: Self-pay | Admitting: Internal Medicine

## 2019-07-12 NOTE — Telephone Encounter (Signed)
Copied from [place]. Topic: General - Other >> Jul 12, 2019  2:28 PM Keene Breath wrote: Reason for CRM: [facility] called to inform the doctor that the prescription they received for pravastatin (PRAVACHOL) 40 MG tablet is too soon to be filled.  Please advise.  CB# [phone], Ref# [id number].  Also stated that it is time sensitive.

## 2019-07-14 NOTE — Telephone Encounter (Signed)
Noted  

## 2019-08-03 ENCOUNTER — Encounter: Payer: Self-pay | Admitting: Gynecology

## 2019-09-10 ENCOUNTER — Other Ambulatory Visit: Payer: Self-pay | Admitting: Internal Medicine

## 2019-09-10 DIAGNOSIS — E038 Other specified hypothyroidism: Secondary | ICD-10-CM

## 2019-09-20 ENCOUNTER — Ambulatory Visit: Payer: Medicare Other | Admitting: Obstetrics & Gynecology

## 2019-09-21 ENCOUNTER — Other Ambulatory Visit: Payer: Self-pay | Admitting: Internal Medicine

## 2019-09-27 ENCOUNTER — Ambulatory Visit: Payer: Medicare Other | Admitting: Neurology

## 2019-09-27 ENCOUNTER — Ambulatory Visit (INDEPENDENT_AMBULATORY_CARE_PROVIDER_SITE_OTHER): Payer: Medicare Other | Admitting: Neurology

## 2019-09-27 ENCOUNTER — Encounter: Payer: Self-pay | Admitting: Neurology

## 2019-09-27 ENCOUNTER — Other Ambulatory Visit: Payer: Self-pay

## 2019-09-27 VITALS — BP 113/67 | HR 63 | Temp 97.7°F | Ht 71.0 in | Wt 187.2 lb

## 2019-09-27 DIAGNOSIS — G250 Essential tremor: Secondary | ICD-10-CM | POA: Diagnosis not present

## 2019-09-27 NOTE — Progress Notes (Signed)
Subjective:    Patient ID: Melissa Lowe is a 78 y.o. female.  HPI     Interim history:   Melissa Lowe is a 78 year old right-handed woman with an underlying medical history of asthma, hypothyroidism and hyperlipidemia, who presents for follow-up consultation of her essential tremor.  The patient is accompanied by her husband today.  I last saw her on 03/17/2018, at which time she felt stable, her exam was also fairly stable, we talked about her tremor triggers and mutually agreed to follow her clinically.   Today, 09/27/2019: She reports feeling stable, in fact, since they have been quarantining since essentially March of this year, she feels that her tremor is better.  She still has flareup especially when it is time to eat, this is observed by her husband that she becomes more shaky.  She had an injury to her left knee in September, she had a torn meniscus and had surgery in November, she is recuperating well, she has been doing her exercises at the instructed by Dr. Maureen Ralphs.  She does not use a cane any longer.  She has been trying to eat well, they are cooking more at home.  [place]  For doctor's appointments they do have to come to [place].  Overall, she is doing well.   The patient's allergies, current medications, family history, past medical history, past social history, past surgical history and problem list were reviewed and updated as appropriate.    Previously (copied from previous notes for reference):   I saw her on 02/23/2017, at which time we talked about tremor triggers. In her case caffeine and sleep deprivation seem to be triggers. She felt she was fairly stable. She reported left hip pain and was status post injection in March 2018. She had some increased allergy symptoms, asthma was stable, she had reflux issues, had stopped her PPI. I suggested a one-year checkup.     I saw her on 02/20/2016, at which time  she reported feeling stable. On examination, her tremor was stable. She had no signs of parkinsonism. She reported that she did genetic testing through 8 and me and reported having markers for kidney disease, lung disease and Alzheimer's disease. We mutually agreed to hold off on symptomatic treatment of her tremors. I suggested one-year checkup.   I first met her on 02/05/2015, at which time she reported feeling stable, no significant worsening of her tremors. She was not impaired in her day-to-day functioning, her ADLs. She felt that her left hand was worse than the right. She had lip quiver but no voice tremor. Exam appeared to be stable. She had no balance issues and thankfully no recent falls. We mutually agreed to monitor her symptoms and not try any medication but we did talk about potentially trying Mysoline down the [place]. She worried about potential side effects. I suggested one-year checkup.    She previously saw Dr. Jim Like and was last seen by him on 07/04/2014 at which time he suggested a 6 mo follow-up and observation, as her tremors were mild. Prior to that, she had seen Dr. Erling Cruz, last in December 2013. She had a new onset lip tremor in or around 2010 which was progressive and then developed hand tremors, left more than right as well as difficulty holding something such as a fork or bulk. She did not have much in the way of improvement with alcohol consumption. She did  note that asthma medication made her tremors worse. She does have a family history of tremors in her mother, who developed a tremor in her 35s.   Her Past Medical History Is Significant For: Past Medical History:  Diagnosis Date  . Allergy   . Asthma   . Diverticulosis   . Fibroid   . Hyperlipidemia   . Hypothyroidism   . IBS (irritable bowel syndrome)   . PAC (premature atrial contraction) 09/22/2016    Her Past Surgical History Is Significant For: Past Surgical History:  Procedure Laterality Date  .  ABDOMINAL HYSTERECTOMY     2  fibroids  . ANTERIOR AND POSTERIOR REPAIR N/A 01/30/2013   Procedure:  POSTERIOR REPAIR (RECTOCELE);  Surgeon: Princess Bruins, MD;  Location: [facility];  Service: Gynecology;  Laterality: N/A;  . arthoscopy Left 08/2018   L knee repair torn meniscus  . CATARACT EXTRACTION    . CHOLECYSTECTOMY    . colon polypectomy  2001   Dr Earlean Shawl  . COLONOSCOPY  12/25/13   Tics , Dr Lajoyce Lauber 2020  . EYE SURGERY    . REPAIR FASCIAL DEFECT LEG  1972  . SEPTOPLASTY    . THIGH FASCIOTOMY      Her Family History Is Significant For: Family History  Problem Relation Age of Onset  . Dementia Mother   . Cancer Father   . Asthma Sister   . Breast cancer Sister   . Stroke Maternal Grandmother        in 51s  . Stroke Paternal Grandmother 89  . Cancer Maternal Grandfather   . Hypertension Neg Hx   . Heart disease Neg Hx     Her Social History Is Significant For: Social History   Socioeconomic History  . Marital status: Married    Spouse name: [name]  . Number of children: 0  . Years of education: Master's   . Highest education level: Not on file  Occupational History  . Not on file  Social Needs  . Financial resource strain: Not on file  . Food insecurity    Worry: Not on file    Inability: Not on file  . Transportation needs    Medical: Not on file    Non-medical: Not on file  Tobacco Use  . Smoking status: Never Smoker  . Smokeless tobacco: Never Used  Substance and Sexual Activity  . Alcohol use: Yes    Comment: Wine couple days weekly  . Drug use: No  . Sexual activity: Yes    Partners: Male    Comment: 1ST INTERCOURSE- ?   PARTNERS- 3  Lifestyle  . Physical activity    Days per week: Not on file    Minutes per session: Not on file  . Stress: Not on file  Relationships  . Social Herbalist on phone: Not on file    Gets together: Not on file    Attends religious service: Not on file    Active member of club or organization: Not  on file    Attends meetings of clubs or organizations: Not on file    Relationship status: Not on file  Other Topics Concern  . Not on file  Social History Narrative   Patient lives at home with her husband [name].    Patient has a Master's Degree and post grad.    Patient has no children.    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    Her Allergies  Are:  Allergies  Allergen Reactions  . Sulfonamide Derivatives     Rash on inside of arms Because of a history of documented adverse serious drug reaction;Medi Alert bracelet  is recommended  . Prednisone Other (See Comments)    ? allergy  . Tetracycline     Severe itching on lower leg  :   Her Current Medications Are:  Outpatient Encounter Medications as of 09/27/2019  Medication Sig  . Ascorbic Acid (VITAMIN C) 500 MG tablet Take 500 mg by mouth daily.    . bimatoprost (LATISSE) 0.03 % ophthalmic solution Place into both eyes. apply evenly along the skin of the upper eyelid at base of eyelashes 3xs weekly at bedtime; repeat procedure for second eye (use a clean applicator).  . budesonide-formoterol (SYMBICORT) 80-4.5 MCG/ACT inhaler USE 1 INHALATION DAILY  . Cholecalciferol (VITAMIN D3) 1000 UNITS CAPS Take by mouth daily.  Marland Kitchen ELDERBERRY PO Take by mouth. [place] Liquid.  . famotidine (PEPCID) 20 MG tablet Take 40 mg by mouth daily.   Marland Kitchen levothyroxine (SYNTHROID) 50 MCG tablet TAKE 1 TABLET(50 MCG) BY MOUTH EVERY MORNING  . montelukast (SINGULAIR) 10 MG tablet Take 1 tablet (10 mg total) by mouth daily.  . Multiple Vitamin (MULTIVITAMIN) capsule Take 1 capsule by mouth daily.    . NON FORMULARY Sleep aid for travel. Possibly Rozerem?  . pravastatin (PRAVACHOL) 40 MG tablet TAKE 1 TABLET AT BEDTIME  . [DISCONTINUED] diphenoxylate-atropine (LOMOTIL) 2.5-0.025 MG tablet Take 1 tablet by mouth 4 (four) times daily as needed for diarrhea or loose stools.   No facility-administered encounter medications on file as of  09/27/2019.   :  Review of Systems:  Out of a complete 14 point review of systems, all are reviewed and negative with the exception of these symptoms as listed below: Review of Systems  Neurological:       Rm 1, husband.  Annual F/u essential tremor L hand.  Since covid in 3/20 they have quarantined themselves, tremor has been less.  L tremor noticeable when sitting on elbows).  Has to leave 12:25pm another appt.     Objective:  Neurological Exam  Physical Exam Physical Examination:   Vitals:   09/27/19 1136  BP: 113/67  Pulse: 63  Temp: 97.7 F (36.5 C)   General Examination: The patient is a very pleasant 78 y.o. female in no acute distress. She appears well-developed and well-nourished and well groomed.   HEENT:Normocephalic, atraumatic, pupils are equal, round and reactive to light. Extraocular tracking is good without limitation to gaze excursion or nystagmus noted. Normal smooth pursuit is noted. Hearing is grossly intact. Face is symmetric with normal facial animation and normal facial sensation. Speech is clear with no dysarthria noted. There is no hypophonia. There is no voice tremor but she has a lower lip and jaw tremor. She has an intermittent head tremor. Head tremor seems stable.   Chest:Clear to auscultation without wheezing, rhonchi or crackles noted.  Heart:S1+S2+0, regular and normal without murmurs, rubs or gallops noted.   Abdomen:Soft, non-tender and non-distended with normal bowel sounds appreciated on auscultation.  Extremities:There is no obvious change.   Skin: Warm and dry without trophic changes noted.  Musculoskeletal: exam reveals, mild tenderness and swelling of L knee.   Neurologically:  Mental status: The patient is awake, alert and oriented in all 4 spheres. Her immediate and remote memory, attention, language skills and fund of knowledge are appropriate. There is no evidence of aphasia, agnosia, apraxia or anomia. Speech is  clear  with normal prosody and enunciation. Thought process is linear. Mood is normal and affect is normal.  Cranial nerves II - XII are as described above under HEENT exam.  Motor exam: Normal bulk, slight resting tremor. She has a mild bilateral upper extremity postural hand tremor, L more than R, minimal action tremor, no intention tremor. Romberg is negative. Fine motor skills and coordination: grossly intact for age.   Cerebellar testing: No dysmetria or intention tremor on finger to nose testing.  Sensory exam: intact to light touch in the upper and lower extremities.  Gait, station and balance: She stands easily. No veering to one side is noted. No leaning to one side is noted. Posture is age-appropriate and stance is narrow based. Gait shows normal stride length and normal pace. No problems turning are noted. She has preserved arm swing when walking.  Assessment and Plan:   In summary, SHAYONNA OCAMPO is a very pleasant 78 year old female with an underlying medical history of asthma,allergies, reflux disease,hypothyroidism, and obesity, who presents for follow-up consultation of her long-standing history of essential tremor. She has a family history of tremors. Findings have been stable over the years. She feels well currently. We can continue to monitor her clinically. Of note, because of her asthma she is not a good candidate for beta blocker, we could consider a small dose of Mysoline in the future. She will FU in one year.  I spent 15 minutes in total face-to-face time with the patient, more than 50% of which was spent in counseling and coordination of care, reviewing test results, reviewing medication and discussing or reviewing the diagnosis of ET, its prognosis and treatment options. Pertinent laboratory and imaging test results that were available during this visit with the patient were reviewed by me and considered in my medical decision making (see chart for details).

## 2019-09-27 NOTE — Patient Instructions (Signed)
I am glad to hear that you are doing well, your exam is stable.  We can continue to observe your symptoms and examination, I would suggest rechecking in 1 year.

## 2019-10-04 ENCOUNTER — Telehealth: Payer: Self-pay | Admitting: Internal Medicine

## 2019-10-04 NOTE — Telephone Encounter (Signed)
Pt is calling to ask if [facility] will be offering the covid vaccine and would like info about this/ please advise

## 2019-10-05 NOTE — Telephone Encounter (Signed)
My chart message sent to patient today.

## 2019-10-25 ENCOUNTER — Other Ambulatory Visit: Payer: Self-pay

## 2019-10-26 ENCOUNTER — Ambulatory Visit: Payer: Medicare Other | Admitting: Obstetrics & Gynecology

## 2019-11-01 ENCOUNTER — Telehealth: Payer: Self-pay

## 2019-11-01 DIAGNOSIS — J45909 Unspecified asthma, uncomplicated: Secondary | ICD-10-CM

## 2019-11-01 DIAGNOSIS — E038 Other specified hypothyroidism: Secondary | ICD-10-CM

## 2019-11-01 NOTE — Telephone Encounter (Signed)
Pt brought new [facility] EFF 10/27/2019 ins card for scan.  Please use [facility] pharmacy for refills. See card for detail.

## 2019-11-05 ENCOUNTER — Other Ambulatory Visit: Payer: Self-pay

## 2019-11-05 ENCOUNTER — Ambulatory Visit: Payer: Medicare Other | Attending: Internal Medicine

## 2019-11-05 DIAGNOSIS — Z23 Encounter for immunization: Secondary | ICD-10-CM

## 2019-11-05 NOTE — Progress Notes (Signed)
   Covid-19 Vaccination Clinic  Name:  Melissa Lowe    MRN: [id number] DOB: 02-22-1941  11/05/2019  Ms. Melissa Lowe was observed post Covid-19 immunization for 15 minutes without incidence. She was provided with Vaccine Information Sheet and instruction to access the V-Safe system.   Ms. Melissa Lowe was instructed to call 911 with any severe reactions post vaccine: Marland Kitchen Difficulty breathing  . Swelling of your face and throat  . A fast heartbeat  . A bad rash all over your body  . Dizziness and weakness    Immunizations Administered    Name Date Dose VIS Date Route   Pfizer COVID-19 Vaccine 11/05/2019 11:58 AM 0.3 mL 10/06/2019 Intramuscular   Manufacturer: [employer], [employer]   Lot: [id number]   [place]: [id number]

## 2019-11-06 MED ORDER — LEVOTHYROXINE SODIUM 50 MCG PO TABS: ORAL_TABLET | ORAL | 0 refills | Status: DC

## 2019-11-06 MED ORDER — MONTELUKAST SODIUM 10 MG PO TABS: 10.0000 mg | ORAL_TABLET | Freq: Every day | ORAL | 0 refills | Status: DC

## 2019-11-06 MED ORDER — PRAVASTATIN SODIUM 40 MG PO TABS: 40.0000 mg | ORAL_TABLET | Freq: Every day | ORAL | 0 refills | Status: DC

## 2019-11-06 NOTE — Telephone Encounter (Signed)
Medications sent

## 2019-11-07 ENCOUNTER — Encounter: Payer: Self-pay | Admitting: Obstetrics & Gynecology

## 2019-11-07 ENCOUNTER — Ambulatory Visit (INDEPENDENT_AMBULATORY_CARE_PROVIDER_SITE_OTHER): Payer: Medicare Other | Admitting: Obstetrics & Gynecology

## 2019-11-07 ENCOUNTER — Other Ambulatory Visit: Payer: Self-pay

## 2019-11-07 DIAGNOSIS — Z4689 Encounter for fitting and adjustment of other specified devices: Secondary | ICD-10-CM

## 2019-11-07 NOTE — Progress Notes (Signed)
    Melissa Lowe 1941-08-30 [id number]        79 y.o.  G0P0000 Married   RP: Pessary maintenance  HPI:  Well with the pessary, but would like to try without.  Had Left Knee Arthroscopy for Meniscal tear in 08/2019, since then has done PT.  Patient has used the opportunity to include Pelvic Floor Reinforcement exercises with PT.     OB History  Gravida Para Term Preterm AB Living  0 0 0 0 0 0  SAB TAB Ectopic Multiple Live Births  0 0 0 0 0    Past medical history,surgical history, problem list, medications, allergies, family history and social history were all reviewed and documented in the EPIC chart.   Directed ROS with pertinent positives and negatives documented in the history of present illness/assessment and plan.  Exam:  There were no vitals filed for this visit. General appearance:  Normal  Gynecologic exam: Vulva normal.  Pessary easily removed.  Normal vaginal secretions.  Pessary cleaned, but not reinserted at patient's request as she wants to try without the pessary.  Vaginal mucosa intact with no inflammation or ulceration.  Minimal cystocele grade 1 out of 4.  Rectocele grade 1 out of 3.   Assessment/Plan:  79 y.o. G0P0000   1. Pessary maintenance Cystocele grade 1/4 with rectocele grade 1/3.  Was well on Milex #2 ring with support.  Will try without the pessary after doing pelvic floor reinforcement with PT.  Will come back for reinsertion of pessary if uncomfortable without.  Follow-up annual gynecologic exam when due.  Counseling on above issues and coordination of care more than 50% for 15 minutes.  Princess Bruins MD, 2:35 PM 11/07/2019

## 2019-11-10 ENCOUNTER — Encounter: Payer: Self-pay | Admitting: Obstetrics & Gynecology

## 2019-11-10 NOTE — Patient Instructions (Signed)
1. Pessary maintenance Cystocele grade 1/4 with rectocele grade 1/3.  Was well on Milex #2 ring with support.  Will try without the pessary after doing pelvic floor reinforcement with PT.  Will come back for reinsertion of pessary if uncomfortable without.  Follow-up annual gynecologic exam when due.  [name], it was a pleasure seeing you today!

## 2019-11-13 ENCOUNTER — Telehealth: Payer: Self-pay | Admitting: *Deleted

## 2019-11-13 NOTE — Telephone Encounter (Signed)
Agree to send MacroBID 100 mg 1 tab PO BID x 7 days.  If Sxs don't improve or recur in the need future, she will need to come in and have a U/A with U. Culture.

## 2019-11-13 NOTE — Telephone Encounter (Signed)
Patient called c/o burning with urination and frequent urination, was seen on 11/07/19 for pessary removal. Patient asked if you would be wiling to send in Rx? Please advise

## 2019-11-14 MED ORDER — NITROFURANTOIN MONOHYD MACRO 100 MG PO CAPS: 100.0000 mg | ORAL_CAPSULE | Freq: Two times a day (BID) | ORAL | 0 refills | Status: DC

## 2019-11-14 NOTE — Telephone Encounter (Signed)
Patient informed, Rx sent.  

## 2019-11-20 ENCOUNTER — Encounter: Payer: Self-pay | Admitting: Internal Medicine

## 2019-11-26 ENCOUNTER — Ambulatory Visit: Payer: Medicare Other | Attending: Internal Medicine

## 2019-11-26 DIAGNOSIS — Z23 Encounter for immunization: Secondary | ICD-10-CM | POA: Insufficient documentation

## 2019-11-26 NOTE — Progress Notes (Signed)
   Covid-19 Vaccination Clinic  Name:  Melissa Lowe    MRN: [id number] DOB: May 02, 1941  11/26/2019  Melissa Lowe was observed post Covid-19 immunization for 15 minutes without incidence. She was provided with Vaccine Information Sheet and instruction to access the V-Safe system.   Melissa Lowe was instructed to call 911 with any severe reactions post vaccine: Marland Kitchen Difficulty breathing  . Swelling of your face and throat  . A fast heartbeat  . A bad rash all over your body  . Dizziness and weakness    Immunizations Administered    Name Date Dose VIS Date Route   Pfizer COVID-19 Vaccine 11/26/2019 10:43 AM 0.3 mL 10/06/2019 Intramuscular   Manufacturer: [place]   Lot: [id number]   [place]: [id number]

## 2019-12-26 ENCOUNTER — Other Ambulatory Visit: Payer: Self-pay

## 2019-12-27 ENCOUNTER — Encounter: Payer: Self-pay | Admitting: Obstetrics & Gynecology

## 2019-12-27 ENCOUNTER — Encounter: Payer: Self-pay | Admitting: Gynecology

## 2019-12-27 ENCOUNTER — Ambulatory Visit (INDEPENDENT_AMBULATORY_CARE_PROVIDER_SITE_OTHER): Payer: Medicare Other | Admitting: Obstetrics & Gynecology

## 2019-12-27 VITALS — BP 138/70

## 2019-12-27 DIAGNOSIS — R300 Dysuria: Secondary | ICD-10-CM

## 2019-12-27 DIAGNOSIS — N814 Uterovaginal prolapse, unspecified: Secondary | ICD-10-CM | POA: Diagnosis not present

## 2019-12-27 DIAGNOSIS — Z4689 Encounter for fitting and adjustment of other specified devices: Secondary | ICD-10-CM | POA: Diagnosis not present

## 2019-12-27 DIAGNOSIS — N816 Rectocele: Secondary | ICD-10-CM | POA: Diagnosis not present

## 2019-12-27 MED ORDER — NITROFURANTOIN MONOHYD MACRO 100 MG PO CAPS: 100.0000 mg | ORAL_CAPSULE | Freq: Two times a day (BID) | ORAL | 0 refills | Status: DC

## 2019-12-27 MED ORDER — ESTRADIOL 0.1 MG/GM VA CREA: 0.2500 | TOPICAL_CREAM | VAGINAL | 4 refills | Status: DC

## 2019-12-27 NOTE — Progress Notes (Signed)
    Melissa Lowe 1940/11/21 [id number]        78 y.o.  G0  RP: Pessary fitting and Dysuria/Urinary frequency  HPI: Painful removal of Milex ring #2 with support, would like to have the size smaller.  Not wearing the pessary x last visit to try without, and currently has dysurina and urinary frequency.  Treated for a Cystitis 11/14/2019.  Feels a small bulge when standing/walking after passing a BM.  Would like to start Estradiol cream.   OB History  Gravida Para Term Preterm AB Living  0 0 0 0 0 0  SAB TAB Ectopic Multiple Live Births  0 0 0 0 0    Past medical history,surgical history, problem list, medications, allergies, family history and social history were all reviewed and documented in the EPIC chart.   Directed ROS with pertinent positives and negatives documented in the history of present illness/assessment and plan.  Exam:  Vitals:   12/27/19 1521  BP: 138/70   General appearance:  Normal  CVAT Neg Bilaterally  Abdomen: Normal  Gynecologic exam: Vulva atrophy of menopause.  Minimal Cysto-Rectocele with valsalva in laying down position.  Vaginal mucosa intact.  U/A: Yellow cloudy, protein negative, nitrite negative, white blood cells 40-60, red blood cells 0-2, moderate bacteria.  Urine culture pending.   Assessment/Plan:  79 y.o. G0P0000   1. Dysuria Probable acute cystitis based on symptoms and urine analysis.  Will treat with Macrobid 100 mg/caps., 1 capsule per mouth twice daily for 7 days.  No contraindication.  Usage reviewed and prescription sent to pharmacy.  Recommend drinking plenty of water. - Urinalysis,Complete w/RFL Culture  2. Encounter for fitting and adjustment of pessary Patient had a Milex ring #2 with support and felt that it was too painful at retrieval.  We will try a smaller ring, decision to try with #0.  Ordering Milex Ring #0 with support.  Patient will follow up for insertion when the ring arrives.  In the meantime, will start back  with Estrace cream.  A quarter of an applicator twice a week and a thin application at the vulva twice a week as well.  Other orders - estradiol (ESTRACE VAGINAL) 0.1 MG/GM vaginal cream; Place [id number] Applicatorfuls vaginally 2 (two) times a week. - nitrofurantoin, macrocrystal-monohydrate, (MACROBID) 100 MG capsule; Take 1 capsule (100 mg total) by mouth 2 (two) times daily for 7 days.  Princess Bruins MD, 3:32 PM 12/27/2019

## 2019-12-28 ENCOUNTER — Encounter: Payer: Self-pay | Admitting: Obstetrics & Gynecology

## 2019-12-28 ENCOUNTER — Telehealth: Payer: Self-pay | Admitting: *Deleted

## 2019-12-28 NOTE — Telephone Encounter (Signed)
-----   Message from Princess Bruins, MD sent at 12/27/2019  5:03 PM EST ----- Regarding: Prescriptions sent already to [facility] Please tell patient that I sent the 2 prescription when she was still in the office.

## 2019-12-28 NOTE — Patient Instructions (Signed)
1. Dysuria Probable acute cystitis based on symptoms and urine analysis.  Will treat with Macrobid 100 mg/caps., 1 capsule per mouth twice daily for 7 days.  No contraindication.  Usage reviewed and prescription sent to pharmacy.  Recommend drinking plenty of water. - Urinalysis,Complete w/RFL Culture  2. Encounter for fitting and adjustment of pessary Patient had a Milex ring #2 with support and felt that it was too painful at retrieval.  We will try a smaller ring, decision to try with #0.  Ordering Milex Ring #0 with support.  Patient will follow up for insertion when the ring arrives.  In the meantime, will start back with Estrace cream.  A quarter of an applicator twice a week and a thin application at the vulva twice a week as well.  Other orders - estradiol (ESTRACE VAGINAL) 0.1 MG/GM vaginal cream; Place [id number] Applicatorfuls vaginally 2 (two) times a week. - nitrofurantoin, macrocrystal-monohydrate, (MACROBID) 100 MG capsule; Take 1 capsule (100 mg total) by mouth 2 (two) times daily for 7 days.  Cambryn, it was a pleasure seeing you today!  I will inform you of your results as soon as they are available.

## 2019-12-28 NOTE — Telephone Encounter (Signed)
Patient informed, she has picked up Rx and started on medication.

## 2019-12-29 ENCOUNTER — Telehealth: Payer: Self-pay | Admitting: *Deleted

## 2019-12-29 MED ORDER — CIPROFLOXACIN HCL 500 MG PO TABS: 500.0000 mg | ORAL_TABLET | Freq: Two times a day (BID) | ORAL | 0 refills | Status: DC

## 2019-12-29 NOTE — Telephone Encounter (Signed)
Patient informed, Rx sent.  

## 2019-12-29 NOTE — Telephone Encounter (Signed)
Patient was prescribed Macrobid 100 mg tablet 1 po bid x 7 days on 12/27/19, patient took 1 day dose of Rx and started having symptoms, c/o difficulty taking a full breath and wheezing yesterday afternoon. Patient did not take dose this am, reports this was notes on Rx as possible side effects. Patient did say you mentioned prednisone Rx in past ,but she is not able to take this as well. Please advise

## 2019-12-29 NOTE — Telephone Encounter (Signed)
E. Coli on preliminary U. Culture.  All to Sulfa and Tetracycline as well.  Please send Cipro 500 mg BID x 7 days.

## 2019-12-30 LAB — URINALYSIS, COMPLETE W/RFL CULTURE
Bilirubin Urine: NEGATIVE
Glucose, UA: NEGATIVE
Hyaline Cast: NONE SEEN /LPF
Ketones, ur: NEGATIVE
Nitrites, Initial: NEGATIVE
Protein, ur: NEGATIVE
Specific Gravity, Urine: 1.025 (ref 1.001–1.03)
pH: 5.5 (ref 5.0–8.0)

## 2019-12-30 LAB — URINE CULTURE
MICRO NUMBER:: 10209677
SPECIMEN QUALITY:: ADEQUATE

## 2019-12-30 LAB — CULTURE INDICATED

## 2020-01-02 ENCOUNTER — Other Ambulatory Visit: Payer: Self-pay

## 2020-01-02 NOTE — Patient Instructions (Addendum)
  Blood work was ordered.     Medications reviewed and updated.  Changes include :   none  Your prescription(s) have been submitted to your pharmacy. Please take as directed and contact our office if you believe you are having problem(s) with the medication(s).   Please followup in 6 months   

## 2020-01-02 NOTE — Progress Notes (Signed)
Subjective:    Patient ID: Melissa Lowe, female    DOB: November 14, 1940, 79 y.o.   MRN: [id number]  HPI The patient is here for follow up of their chronic medical problems, including hyperlipidemia, prediabetes, hypothyroidism, GERD, asthma.  She is taking all of her medications as prescribed.   She is not exercising regularly.       Since she was here she had right knee arthroscopy for meniscus tear.  She has recovered.  She has not gotten back into exercise.     Medications and allergies reviewed with patient and updated if appropriate.  Patient Active Problem List   Diagnosis Date Noted  . Internal hemorrhoids 02/23/2018  . Plantar fasciitis of left foot 12/29/2017  . Hypothyroidism 01/12/2017  . GERD (gastroesophageal reflux disease) 01/12/2017  . PAC (premature atrial contraction) 09/22/2016  . Fatty liver, mild 05/19/2016  . Prediabetes 05/14/2016  . Benign essential tremor 05/14/2016  . Cystocele with prolapse 05/14/2016  . LIPOMA 03/10/2010  . Tinnitus 03/10/2010  . DIVERTICULOSIS, COLON 10/18/2008  . Hyperlipidemia 08/08/2007  . HIP PAIN 08/08/2007  . Asthma without acute exacerbation 08/02/2007  . Personal history of colonic polyps 08/02/2007    Current Outpatient Medications on File Prior to Visit  Medication Sig Dispense Refill  . Ascorbic Acid (VITAMIN C) 500 MG tablet Take 500 mg by mouth daily.      . bimatoprost (LATISSE) 0.03 % ophthalmic solution Place into both eyes. apply evenly along the skin of the upper eyelid at base of eyelashes 3xs weekly at bedtime; repeat procedure for second eye (use a clean applicator).    . budesonide-formoterol (SYMBICORT) 80-4.5 MCG/ACT inhaler USE 1 INHALATION DAILY 30.6 g 3  . Cholecalciferol (VITAMIN D3) 1000 UNITS CAPS Take by mouth daily.    . ciprofloxacin (CIPRO) 500 MG tablet Take 1 tablet (500 mg total) by mouth 2 (two) times daily. 14 tablet 0  . ELDERBERRY PO Take by mouth. [place] Liquid.    Marland Kitchen estradiol  (ESTRACE VAGINAL) 0.1 MG/GM vaginal cream Place [id number] Applicatorfuls vaginally 2 (two) times a week. 42.5 g 4  . famotidine (PEPCID) 20 MG tablet Take 40 mg by mouth daily.     Marland Kitchen levothyroxine (SYNTHROID) 50 MCG tablet TAKE 1 TABLET(50 MCG) BY MOUTH EVERY MORNING 90 tablet 0  . montelukast (SINGULAIR) 10 MG tablet Take 1 tablet (10 mg total) by mouth daily. 90 tablet 0  . Multiple Vitamin (MULTIVITAMIN) capsule Take 1 capsule by mouth daily.      . NON FORMULARY Sleep aid for travel. Possibly Rozerem?    . pravastatin (PRAVACHOL) 40 MG tablet Take 1 tablet (40 mg total) by mouth at bedtime. 90 tablet 0   No current facility-administered medications on file prior to visit.    Past Medical History:  Diagnosis Date  . Allergy   . Asthma   . Diverticulosis   . Fibroid   . Hyperlipidemia   . Hypothyroidism   . IBS (irritable bowel syndrome)   . PAC (premature atrial contraction) 09/22/2016    Past Surgical History:  Procedure Laterality Date  . ABDOMINAL HYSTERECTOMY     2  fibroids  . ANTERIOR AND POSTERIOR REPAIR N/A 01/30/2013   Procedure:  POSTERIOR REPAIR (RECTOCELE);  Surgeon: Princess Bruins, MD;  Location: [facility];  Service: Gynecology;  Laterality: N/A;  . arthoscopy Left 08/2018   L knee repair torn meniscus  . CATARACT EXTRACTION    . CHOLECYSTECTOMY    . colon polypectomy  2001   Dr Earlean Shawl  . COLONOSCOPY  12/25/13   Tics , Dr Lajoyce Lauber 2020  . EYE SURGERY    . REPAIR FASCIAL DEFECT LEG  1972  . SEPTOPLASTY    . THIGH FASCIOTOMY      Social History   Socioeconomic History  . Marital status: Married    Spouse name: [name]  . Number of children: 0  . Years of education: Master's   . Highest education level: Not on file  Occupational History  . Not on file  Tobacco Use  . Smoking status: Never Smoker  . Smokeless tobacco: Never Used  Substance and Sexual Activity  . Alcohol use: Yes    Comment: Wine couple days weekly  . Drug use: No  . Sexual activity:  Yes    Partners: Male    Comment: 1ST INTERCOURSE- ?   PARTNERS- 3  Other Topics Concern  . Not on file  Social History Narrative   Patient lives at home with her husband [name].    Patient has a Master's Degree and post grad.    Patient has no children.    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   Social Determinants of Health   Financial Resource Strain:   . Difficulty of Paying Living Expenses: Not on file  Food Insecurity:   . Worried About Charity fundraiser in the Last Year: Not on file  . Ran Out of Food in the Last Year: Not on file  Transportation Needs:   . Lack of Transportation (Medical): Not on file  . Lack of Transportation (Non-Medical): Not on file  Physical Activity:   . Days of Exercise per Week: Not on file  . Minutes of Exercise per Session: Not on file  Stress:   . Feeling of Stress : Not on file  Social Connections:   . Frequency of Communication with Friends and Family: Not on file  . Frequency of Social Gatherings with Friends and Family: Not on file  . Attends Religious Services: Not on file  . Active Member of Clubs or Organizations: Not on file  . Attends Archivist Meetings: Not on file  . Marital Status: Not on file    Family History  Problem Relation Age of Onset  . Dementia Mother   . Cancer Father   . Asthma Sister   . Breast cancer Sister   . Stroke Maternal Grandmother        in 42s  . Stroke Paternal Grandmother 89  . Cancer Maternal Grandfather   . Hypertension Neg Hx   . Heart disease Neg Hx     Review of Systems  Constitutional: Negative for chills and fever.  Respiratory: Negative for cough, shortness of breath and wheezing.   Cardiovascular: Negative for chest pain, palpitations and leg swelling.  Neurological: Positive for headaches. Negative for light-headedness.       Objective:   Vitals:   01/03/20 0912  BP: 120/72  Pulse: 65  Temp: 98.2 F (36.8 C)  SpO2: 96%   BP Readings from  Last 3 Encounters:  01/03/20 136/74  01/03/20 120/72  12/27/19 138/70   Wt Readings from Last 3 Encounters:  01/03/20 188 lb 12.8 oz (85.6 kg)  09/27/19 187 lb 3.2 oz (84.9 kg)  07/05/19 185 lb (83.9 kg)   Body mass index is 26.33 kg/m.   Physical Exam    Constitutional: Appears well-developed and well-nourished. No distress.  Resting tremor. HENT:  Head:  Normocephalic and atraumatic.  Neck: Neck supple. No tracheal deviation present. No thyromegaly present.  No cervical lymphadenopathy Cardiovascular: Normal rate, regular rhythm and normal heart sounds.  No murmur heard. No carotid bruit .  No edema Pulmonary/Chest: Effort normal and breath sounds normal. No respiratory distress. No has no wheezes. No rales.  Skin: Skin is warm and dry. Not diaphoretic.  Psychiatric: Normal mood and affect. Behavior is normal.      Assessment & Plan:    See Problem List for Assessment and Plan of chronic medical problems.    This visit occurred during the SARS-CoV-2 public health emergency.  Safety protocols were in place, including screening questions prior to the visit, additional usage of staff PPE, and extensive cleaning of exam room while observing appropriate contact time as indicated for disinfecting solutions.

## 2020-01-03 ENCOUNTER — Encounter: Payer: Self-pay | Admitting: Internal Medicine

## 2020-01-03 ENCOUNTER — Ambulatory Visit (INDEPENDENT_AMBULATORY_CARE_PROVIDER_SITE_OTHER): Payer: Medicare Other | Admitting: Obstetrics & Gynecology

## 2020-01-03 ENCOUNTER — Encounter: Payer: Self-pay | Admitting: Obstetrics & Gynecology

## 2020-01-03 ENCOUNTER — Ambulatory Visit: Payer: Medicare Other | Admitting: Internal Medicine

## 2020-01-03 VITALS — BP 120/72 | HR 65 | Temp 98.2°F | Ht 71.0 in | Wt 188.8 lb

## 2020-01-03 VITALS — BP 136/74

## 2020-01-03 DIAGNOSIS — K219 Gastro-esophageal reflux disease without esophagitis: Secondary | ICD-10-CM

## 2020-01-03 DIAGNOSIS — R7303 Prediabetes: Secondary | ICD-10-CM

## 2020-01-03 DIAGNOSIS — E7849 Other hyperlipidemia: Secondary | ICD-10-CM

## 2020-01-03 DIAGNOSIS — J45909 Unspecified asthma, uncomplicated: Secondary | ICD-10-CM

## 2020-01-03 DIAGNOSIS — E038 Other specified hypothyroidism: Secondary | ICD-10-CM

## 2020-01-03 DIAGNOSIS — N8110 Cystocele, unspecified: Secondary | ICD-10-CM | POA: Diagnosis not present

## 2020-01-03 LAB — COMPREHENSIVE METABOLIC PANEL
ALT: 23 U/L (ref 0–35)
AST: 19 U/L (ref 0–37)
Albumin: 4.3 g/dL (ref 3.5–5.2)
Alkaline Phosphatase: 51 U/L (ref 39–117)
BUN: 14 mg/dL (ref 6–23)
CO2: 28 mEq/L (ref 19–32)
Calcium: 9.7 mg/dL (ref 8.4–10.5)
Chloride: 106 mEq/L (ref 96–112)
Creatinine, Ser: 0.97 mg/dL (ref 0.40–1.20)
GFR: 55.74 mL/min — ABNORMAL LOW (ref >60.00)
Glucose, Bld: 103 mg/dL — ABNORMAL HIGH (ref 70–99)
Potassium: 4.3 mEq/L (ref 3.5–5.1)
Sodium: 139 mEq/L (ref 135–145)
Total Bilirubin: 0.4 mg/dL (ref 0.2–1.2)
Total Protein: 6.9 g/dL (ref 6.0–8.3)

## 2020-01-03 LAB — LIPID PANEL
Cholesterol: 192 mg/dL (ref 0–200)
HDL: 68.1 mg/dL (ref >39.00)
LDL Cholesterol: 102 mg/dL — ABNORMAL HIGH (ref 0–99)
NonHDL: 124.13
Total CHOL/HDL Ratio: 3
Triglycerides: 110 mg/dL (ref 0.0–149.0)
VLDL: 22 mg/dL (ref 0.0–40.0)

## 2020-01-03 LAB — HEMOGLOBIN A1C: Hgb A1c MFr Bld: 5.7 % (ref 4.6–6.5)

## 2020-01-03 MED ORDER — ALBUTEROL SULFATE HFA 108 (90 BASE) MCG/ACT IN AERS: INHALATION_SPRAY | RESPIRATORY_TRACT | 3 refills | Status: DC

## 2020-01-03 NOTE — Progress Notes (Signed)
    Melissa Lowe Mar 31, 1941 [id number]        79 y.o.  G0 Married  RP: New Milex ring #0 with support for vaginal insertion  HPI: Tried with tampons in the meantime and had good results with no vaginal bulging and better flow of urine.  Received her new Milex ring #0 with support for insertion today.  Patient trying to get the smallest ring that will help, so that she can remove it for cleaning with less pain.   OB History  Gravida Para Term Preterm AB Living  0 0 0 0 0 0  SAB TAB Ectopic Multiple Live Births  0 0 0 0 0    Past medical history,surgical history, problem list, medications, allergies, family history and social history were all reviewed and documented in the EPIC chart.   Directed ROS with pertinent positives and negatives documented in the history of present illness/assessment and plan.  Exam:  Vitals:   01/03/20 1056  BP: 136/74   General appearance:  Normal  Abdomen: Normal  Gynecologic exam: Vulva normal.  Vaginal mucosa normal, no discharge.  Easy insertion of Milex ring #0 pessary with support.   Assessment/Plan:  79 y.o. G0  1. Baden-Walker grade 1 cystocele Insertion of Milex ring #0 with support.  At first patient was fine with it, but before leaving the office she mentioned that it tended to descend towards the vulva.  We therefore ordered a Milex ring #1 with support for which she will come back for insertion as soon as we receive it.  In the meantime she may use tampons again as it worked well previously.  Princess Bruins MD, 11:13 AM 01/03/2020    .

## 2020-01-03 NOTE — Assessment & Plan Note (Signed)
Chronic  Clinically euthyroid Check tsh  Titrate med dose if needed  

## 2020-01-03 NOTE — Assessment & Plan Note (Signed)
Chronic Her asthma is well controlled Taking Symbicort daily, continue continue singulair

## 2020-01-03 NOTE — Assessment & Plan Note (Signed)
Chronic GERD controlled Continue daily medication - pepcid daily

## 2020-01-03 NOTE — Assessment & Plan Note (Signed)
Chronic Check a1c Low sugar / carb diet Stressed regular exercise  

## 2020-01-03 NOTE — Assessment & Plan Note (Signed)
Chronic Check lipid panel  Continue daily statin Regular exercise and healthy diet encouraged  

## 2020-01-04 ENCOUNTER — Telehealth: Payer: Self-pay | Admitting: *Deleted

## 2020-01-04 ENCOUNTER — Encounter: Payer: Self-pay | Admitting: Internal Medicine

## 2020-01-04 LAB — TSH: TSH: 2.94 u[IU]/mL (ref 0.35–4.50)

## 2020-01-04 NOTE — Telephone Encounter (Signed)
Patient was seen yesterday  for pessary insertion, patient said when she was walking out of the room in the office the pessary slipped she did push it back inside. Patient said when she got to the car in the parking lot it slipped more, by the time she got home the pessary was coming out the vagina. She has now removed the pessary and has inserted a tampon for now. Patient would like to proceed with ordering the next size up.  Please advise

## 2020-01-04 NOTE — Telephone Encounter (Signed)
Left detailed message on voicemail , message sent to Maudie Mercury G to order pessary.

## 2020-01-04 NOTE — Telephone Encounter (Signed)
Please order Milex ring #1 with support.

## 2020-01-06 ENCOUNTER — Encounter: Payer: Self-pay | Admitting: Obstetrics & Gynecology

## 2020-01-06 NOTE — Patient Instructions (Signed)
1. Baden-Walker grade 1 cystocele Insertion of Milex ring #0 with support.  At first patient was fine with it, but before leaving the office she mentioned that it tended to descend towards the vulva.  We therefore ordered a Milex ring #1 with support for which she will come back for insertion as soon as we receive it.  In the meantime she may use tampons again as it worked well previously.  Melissa Lowe, it was a pleasure seeing you today!

## 2020-01-07 ENCOUNTER — Other Ambulatory Visit: Payer: Self-pay | Admitting: Internal Medicine

## 2020-01-07 DIAGNOSIS — E038 Other specified hypothyroidism: Secondary | ICD-10-CM

## 2020-01-08 ENCOUNTER — Encounter: Payer: Self-pay | Admitting: Gynecology

## 2020-01-12 ENCOUNTER — Other Ambulatory Visit: Payer: Self-pay

## 2020-01-12 MED ORDER — ALBUTEROL SULFATE HFA 108 (90 BASE) MCG/ACT IN AERS: INHALATION_SPRAY | RESPIRATORY_TRACT | 3 refills | Status: DC

## 2020-01-15 ENCOUNTER — Other Ambulatory Visit: Payer: Self-pay

## 2020-01-15 ENCOUNTER — Encounter: Payer: Self-pay | Admitting: Obstetrics & Gynecology

## 2020-01-15 ENCOUNTER — Ambulatory Visit (INDEPENDENT_AMBULATORY_CARE_PROVIDER_SITE_OTHER): Payer: Medicare Other | Admitting: Obstetrics & Gynecology

## 2020-01-15 VITALS — BP 130/84

## 2020-01-15 DIAGNOSIS — N8110 Cystocele, unspecified: Secondary | ICD-10-CM

## 2020-01-15 DIAGNOSIS — Z4689 Encounter for fitting and adjustment of other specified devices: Secondary | ICD-10-CM

## 2020-01-15 NOTE — Patient Instructions (Signed)
1. Encounter for fitting and adjustment of pessary Good fit of Milex ring pessary #1 with support.  Patient tried it with Valsalva while after insertion and the pessary stayed in place.  We will see if at that size she has more ease at removal for cleaning.  If not, we will follow-up here to do it.  [name], good seeing you today!

## 2020-01-15 NOTE — Progress Notes (Signed)
    ADENE MIZER 10-Jun-1941 [id number]        79 y.o.  G0P0000   RP: Insertion of Milex Ring Pessary  HPI: Milex ring Pessary #2 and #0 not fitting well.  Trying Milex Pessary ring #1 with support today.   OB History  Gravida Para Term Preterm AB Living  0 0 0 0 0 0  SAB TAB Ectopic Multiple Live Births  0 0 0 0 0    Past medical history,surgical history, problem list, medications, allergies, family history and social history were all reviewed and documented in the EPIC chart.   Directed ROS with pertinent positives and negatives documented in the history of present illness/assessment and plan.  Exam:  Vitals:   01/15/20 1010  BP: 130/84   General appearance:  Normal  Abdomen: Normal  Gynecologic exam: Vulva normal.  Easy insertion of Milex ring pessary #1 with support.  Good fit.   Assessment/Plan:  79 y.o. G0  1. Encounter for fitting and adjustment of pessary Good fit of Milex ring pessary #1 with support.  Patient tried it with Valsalva while after insertion and the pessary stayed in place.  We will see if at that size she has more ease at removal for cleaning.  If not, we will follow-up here to do it.  Princess Bruins MD, 10:21 AM 01/15/2020

## 2020-01-17 ENCOUNTER — Telehealth: Payer: Self-pay

## 2020-01-17 ENCOUNTER — Other Ambulatory Visit: Payer: Self-pay

## 2020-01-17 MED ORDER — ALBUTEROL SULFATE HFA 108 (90 BASE) MCG/ACT IN AERS: 1.0000 | INHALATION_SPRAY | Freq: Four times a day (QID) | RESPIRATORY_TRACT | 3 refills | Status: DC | PRN

## 2020-01-17 NOTE — Telephone Encounter (Signed)
New message   [employer] calling   Ref # [id number]  Please clarify direction on albuterol (PROAIR HFA) 108 (90 Base) MCG/ACT inhaler

## 2020-01-18 MED ORDER — ALBUTEROL SULFATE HFA 108 (90 BASE) MCG/ACT IN AERS: 1.0000 | INHALATION_SPRAY | Freq: Four times a day (QID) | RESPIRATORY_TRACT | 3 refills | Status: DC | PRN

## 2020-01-18 NOTE — Telephone Encounter (Signed)
Rx fixed and resent.

## 2020-05-03 ENCOUNTER — Other Ambulatory Visit: Payer: Self-pay | Admitting: Internal Medicine

## 2020-05-03 DIAGNOSIS — E038 Other specified hypothyroidism: Secondary | ICD-10-CM

## 2020-05-16 ENCOUNTER — Encounter: Payer: Medicare Other | Admitting: Obstetrics & Gynecology

## 2020-06-18 ENCOUNTER — Encounter: Payer: Self-pay | Admitting: Obstetrics & Gynecology

## 2020-06-18 ENCOUNTER — Ambulatory Visit (INDEPENDENT_AMBULATORY_CARE_PROVIDER_SITE_OTHER): Payer: Medicare Other | Admitting: Obstetrics & Gynecology

## 2020-06-18 ENCOUNTER — Other Ambulatory Visit: Payer: Self-pay

## 2020-06-18 ENCOUNTER — Encounter: Payer: Self-pay | Admitting: Internal Medicine

## 2020-06-18 VITALS — BP 130/78 | Ht 66.0 in | Wt 189.0 lb

## 2020-06-18 DIAGNOSIS — N8110 Cystocele, unspecified: Secondary | ICD-10-CM

## 2020-06-18 DIAGNOSIS — Z01419 Encounter for gynecological examination (general) (routine) without abnormal findings: Secondary | ICD-10-CM | POA: Diagnosis not present

## 2020-06-18 DIAGNOSIS — Z90710 Acquired absence of both cervix and uterus: Secondary | ICD-10-CM

## 2020-06-18 DIAGNOSIS — Z4689 Encounter for fitting and adjustment of other specified devices: Secondary | ICD-10-CM

## 2020-06-18 DIAGNOSIS — Z780 Asymptomatic menopausal state: Secondary | ICD-10-CM | POA: Diagnosis not present

## 2020-06-18 NOTE — Progress Notes (Signed)
Melissa Lowe 1941/02/03 [id number]   History:    79 y.o. G0 Married.  At [facility] since 12/2018.  RP:  Established patient presenting for annual gyn exam   HPI: Postmenopause, well on no HRT.  S/P Total Hysterectomy.  No pelvic pain.  Well with pessary Milex ring #1 with support. Abstinent.  Urine and bowel movements normal.  Breasts normal. Body mass index 30.51.  Back to walking after knee issues.  Health labs with family physician. Colonoscopy 2020.  Past medical history,surgical history, family history and social history were all reviewed and documented in the EPIC chart.  Gynecologic History No LMP recorded. Patient is postmenopausal.  Obstetric History OB History  Gravida Para Term Preterm AB Living  0 0 0 0 0 0  SAB TAB Ectopic Multiple Live Births  0 0 0 0 0     ROS: A ROS was performed and pertinent positives and negatives are included in the history.  GENERAL: No fevers or chills. HEENT: No change in vision, no earache, sore throat or sinus congestion. NECK: No pain or stiffness. CARDIOVASCULAR: No chest pain or pressure. No palpitations. PULMONARY: No shortness of breath, cough or wheeze. GASTROINTESTINAL: No abdominal pain, nausea, vomiting or diarrhea, melena or bright red blood per rectum. GENITOURINARY: No urinary frequency, urgency, hesitancy or dysuria. MUSCULOSKELETAL: No joint or muscle pain, no back pain, no recent trauma. DERMATOLOGIC: No rash, no itching, no lesions. ENDOCRINE: No polyuria, polydipsia, no heat or cold intolerance. No recent change in weight. HEMATOLOGICAL: No anemia or easy bruising or bleeding. NEUROLOGIC: No headache, seizures, numbness, tingling or weakness. PSYCHIATRIC: No depression, no loss of interest in normal activity or change in sleep pattern.     Exam:   BP 130/78   Ht 5\' 6"  (1.676 m)   Wt 189 lb (85.7 kg)   BMI 30.51 kg/m   Body mass index is 30.51 kg/m.  General appearance : Well developed well nourished female.  No acute distress HEENT: Eyes: no retinal hemorrhage or exudates,  Neck supple, trachea midline, no carotid bruits, no thyroidmegaly Lungs: Clear to auscultation, no rhonchi or wheezes, or rib retractions  Heart: Regular rate and rhythm, no murmurs or gallops Breast:Examined in sitting and supine position were symmetrical in appearance, no palpable masses or tenderness,  no skin retraction, no nipple inversion, no nipple discharge, no skin discoloration, no axillary or supraclavicular lymphadenopathy Abdomen: no palpable masses or tenderness, no rebound or guarding Extremities: no edema or skin discoloration or tenderness  Pelvic: Vulva: Normal             Vagina: No gross lesions or discharge.  Pessary removed, cleaned and reinserted.  Cervix/Uterus absent  Adnexa  Without masses or tenderness  Anus: Normal   Assessment/Plan:  79 y.o. female for annual exam   1. Well female exam with routine gynecological exam Gynecologic exam status post total hysterectomy.  No indication to repeat a Pap test this year.  Breast exam normal.  Patient will schedule a screening mammogram now.  Fasting health labs with family physician.  Body mass index 30.51.  Low calorie/carb diet recommended.  Aerobic activities 5 times a week and light weightlifting every 2 days.  2. Hx of total hysterectomy  3. Postmenopause Well on no hormone replacement therapy.  Last bone density August 2018 was normal.  We will repeat a bone density at 5 years.  Vitamin D supplements, calcium intake of 1200 to 1500 mg daily and regular weightbearing physical activities.  4. Pessary maintenance Well on Milex ring #1 with support.  Pessary maintenance done today.  We will follow-up for pessary maintenance in 6 months.  Princess Bruins MD, 11:42 AM 06/18/2020

## 2020-06-21 ENCOUNTER — Other Ambulatory Visit: Payer: Self-pay | Admitting: Obstetrics & Gynecology

## 2020-06-21 DIAGNOSIS — Z1231 Encounter for screening mammogram for malignant neoplasm of breast: Secondary | ICD-10-CM

## 2020-06-26 ENCOUNTER — Ambulatory Visit
Admission: RE | Admit: 2020-06-26 | Discharge: 2020-06-26 | Disposition: A | Discharge status: Home / Self Care | Payer: Medicare Other | Source: Ambulatory Visit | Attending: Obstetrics & Gynecology | Admitting: Obstetrics & Gynecology

## 2020-06-26 ENCOUNTER — Other Ambulatory Visit: Payer: Self-pay

## 2020-06-26 DIAGNOSIS — Z1231 Encounter for screening mammogram for malignant neoplasm of breast: Secondary | ICD-10-CM

## 2020-07-08 NOTE — Progress Notes (Signed)
Subjective:    Patient ID: Melissa Lowe, female    DOB: [birth year], 79 y.o.   MRN: [id number]  HPI The patient is here for follow up of their chronic medical problems, including hyperlipidemia, prediabetes, hypothyroidism, GERD, asthma  She is taking all of her medications as prescribed.   She is not exercising regularly.     She had another episode of vertigo.  She did the Epley manuver and it helped.  Her tinnitus is getter louder.  She thinks her hearing is a little worse.  She has had her hearing evaluated in the past and they did recommend hearing aids.  In July and august at night she felt wheezy.  She uses symbicort once a day, occasionally twice a day.  She is not had wheezing recently.  Medications and allergies reviewed with patient and updated if appropriate.  Patient Active Problem List   Diagnosis Date Noted  . Internal hemorrhoids 02/23/2018  . Plantar fasciitis of left foot 12/29/2017  . Hypothyroidism 01/12/2017  . GERD (gastroesophageal reflux disease) 01/12/2017  . PAC (premature atrial contraction) 09/22/2016  . Fatty liver, mild 05/19/2016  . Prediabetes 05/14/2016  . Benign essential tremor 05/14/2016  . Cystocele with prolapse 05/14/2016  . LIPOMA 03/10/2010  . Tinnitus 03/10/2010  . DIVERTICULOSIS, COLON 10/18/2008  . Hyperlipidemia 08/08/2007  . HIP PAIN 08/08/2007  . Asthma without acute exacerbation 08/02/2007  . Personal history of colonic polyps 08/02/2007    Current Outpatient Medications on File Prior to Visit  Medication Sig Dispense Refill  . albuterol (PROAIR HFA) 108 (90 Base) MCG/ACT inhaler Inhale 1 puff into the lungs every 6 (six) hours as needed for wheezing or shortness of breath. 18 g 3  . Ascorbic Acid (VITAMIN C) 500 MG tablet Take 500 mg by mouth daily.      . bimatoprost (LATISSE) 0.03 % ophthalmic solution Place into both eyes. apply evenly along the skin of the upper eyelid at base of eyelashes 3xs weekly at bedtime;  repeat procedure for second eye (use a clean applicator).    . budesonide-formoterol (SYMBICORT) 80-4.5 MCG/ACT inhaler USE 1 INHALATION DAILY 30.6 g 3  . Cholecalciferol (VITAMIN D3) 1000 UNITS CAPS Take by mouth daily.    Marland Kitchen ELDERBERRY PO Take by mouth. Bucoli Liquid.    . famotidine (PEPCID) 20 MG tablet Take 40 mg by mouth daily.     Marland Kitchen levothyroxine (SYNTHROID) 50 MCG tablet TAKE 1 TABLET BY MOUTH IN  THE MORNING 90 tablet 0  . montelukast (SINGULAIR) 10 MG tablet Take 1 tablet (10 mg total) by mouth daily. Annual appt due in Sept must see provider for future refills 90 tablet 0  . Multiple Vitamin (MULTIVITAMIN) capsule Take 1 capsule by mouth daily.      . NON FORMULARY Sleep aid for travel. Possibly Rozerem?    . pravastatin (PRAVACHOL) 40 MG tablet Take 1 tablet (40 mg total) by mouth at bedtime. Annual appt due in Sept must see provider for future refills 90 tablet 0   No current facility-administered medications on file prior to visit.    Past Medical History:  Diagnosis Date  . Allergy   . Asthma   . Diverticulosis   . Fibroid   . Hyperlipidemia   . Hypothyroidism   . IBS (irritable bowel syndrome)   . PAC (premature atrial contraction) 09/22/2016    Past Surgical History:  Procedure Laterality Date  . ABDOMINAL HYSTERECTOMY     2  fibroids  .  ANTERIOR AND POSTERIOR REPAIR N/A 01/30/2013   Procedure:  POSTERIOR REPAIR (RECTOCELE);  Surgeon: Princess Bruins, MD;  Location: [facility];  Service: Gynecology;  Laterality: N/A;  . arthoscopy Left 08/2018   L knee repair torn meniscus  . CATARACT EXTRACTION    . CHOLECYSTECTOMY    . colon polypectomy  2001   Dr Earlean Shawl  . COLONOSCOPY  12/25/13   Tics , Dr Lajoyce Lauber 2020  . EYE SURGERY    . KNEE SURGERY Left 2020   MENISCUS REPAIR  . REPAIR FASCIAL DEFECT LEG  1972  . SEPTOPLASTY    . THIGH FASCIOTOMY      Social History   Socioeconomic History  . Marital status: Married    Spouse name: [name]  . Number of  children: 0  . Years of education: Master's   . Highest education level: Not on file  Occupational History  . Not on file  Tobacco Use  . Smoking status: Never Smoker  . Smokeless tobacco: Never Used  Vaping Use  . Vaping Use: Never used  Substance and Sexual Activity  . Alcohol use: Yes    Comment: Wine couple days weekly  . Drug use: No  . Sexual activity: Yes    Partners: Male    Comment: 1ST INTERCOURSE- ?   PARTNERS- 3  Other Topics Concern  . Not on file  Social History Narrative   Patient lives at home with her husband [name].    Patient has a Master's Degree and post grad.    Patient has no children.    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   Social Determinants of Health   Financial Resource Strain:   . Difficulty of Paying Living Expenses: Not on file  Food Insecurity:   . Worried About Charity fundraiser in the Last Year: Not on file  . Ran Out of Food in the Last Year: Not on file  Transportation Needs:   . Lack of Transportation (Medical): Not on file  . Lack of Transportation (Non-Medical): Not on file  Physical Activity:   . Days of Exercise per Week: Not on file  . Minutes of Exercise per Session: Not on file  Stress:   . Feeling of Stress : Not on file  Social Connections:   . Frequency of Communication with Friends and Family: Not on file  . Frequency of Social Gatherings with Friends and Family: Not on file  . Attends Religious Services: Not on file  . Active Member of Clubs or Organizations: Not on file  . Attends Archivist Meetings: Not on file  . Marital Status: Not on file    Family History  Problem Relation Age of Onset  . Dementia Mother   . Cancer Father   . Asthma Sister   . Breast cancer Sister   . Stroke Maternal Grandmother        in 25s  . Stroke Paternal Grandmother 89  . Cancer Maternal Grandfather   . Hypertension Neg Hx   . Heart disease Neg Hx     Review of Systems  Constitutional: Negative  for chills and fever.  HENT: Positive for hearing loss and tinnitus.   Respiratory: Negative for cough, shortness of breath and wheezing.   Cardiovascular: Negative for chest pain, palpitations (rare, ? PACs) and leg swelling.  Gastrointestinal:       Denies GERD  Neurological: Positive for dizziness (occ) and headaches (related to vision). Negative for light-headedness.  Objective:   Vitals:   07/09/20 0955  BP: 112/62  Pulse: 72  Temp: 98.5 F (36.9 C)  SpO2: 97%   BP Readings from Last 3 Encounters:  07/09/20 112/62  06/18/20 130/78  01/15/20 130/84   Wt Readings from Last 3 Encounters:  07/09/20 186 lb 12.8 oz (84.7 kg)  06/18/20 189 lb (85.7 kg)  01/03/20 188 lb 12.8 oz (85.6 kg)   Body mass index is 30.15 kg/m.   Physical Exam    Constitutional: Appears well-developed and well-nourished. No distress.  HENT:  Head: Normocephalic and atraumatic.  Neck: Neck supple. No tracheal deviation present. No thyromegaly present.  No cervical lymphadenopathy Cardiovascular: Normal rate, regular rhythm and normal heart sounds.   No murmur heard. No carotid bruit .  No edema Pulmonary/Chest: Effort normal and breath sounds normal. No respiratory distress. No has no wheezes. No rales.  Skin: Skin is warm and dry. Not diaphoretic.  Psychiatric: Normal mood and affect. Behavior is normal.      Assessment & Plan:    See Problem List for Assessment and Plan of chronic medical problems.    This visit occurred during the SARS-CoV-2 public health emergency.  Safety protocols were in place, including screening questions prior to the visit, additional usage of staff PPE, and extensive cleaning of exam room while observing appropriate contact time as indicated for disinfecting solutions.

## 2020-07-08 NOTE — Patient Instructions (Addendum)
  Blood work was ordered.   ° ° °Medications reviewed and updated.  Changes include :   none ° ° ° °Please followup in 6 months ° ° °

## 2020-07-09 ENCOUNTER — Ambulatory Visit: Payer: Medicare Other | Admitting: Internal Medicine

## 2020-07-09 ENCOUNTER — Encounter: Payer: Self-pay | Admitting: Internal Medicine

## 2020-07-09 ENCOUNTER — Other Ambulatory Visit: Payer: Self-pay

## 2020-07-09 VITALS — BP 112/62 | HR 72 | Temp 98.5°F | Wt 186.8 lb

## 2020-07-09 DIAGNOSIS — E038 Other specified hypothyroidism: Secondary | ICD-10-CM | POA: Diagnosis not present

## 2020-07-09 DIAGNOSIS — J45909 Unspecified asthma, uncomplicated: Secondary | ICD-10-CM | POA: Diagnosis not present

## 2020-07-09 DIAGNOSIS — R7303 Prediabetes: Secondary | ICD-10-CM | POA: Diagnosis not present

## 2020-07-09 DIAGNOSIS — E7849 Other hyperlipidemia: Secondary | ICD-10-CM

## 2020-07-09 DIAGNOSIS — R0989 Other specified symptoms and signs involving the circulatory and respiratory systems: Secondary | ICD-10-CM | POA: Insufficient documentation

## 2020-07-09 DIAGNOSIS — R6889 Other general symptoms and signs: Secondary | ICD-10-CM

## 2020-07-09 DIAGNOSIS — K219 Gastro-esophageal reflux disease without esophagitis: Secondary | ICD-10-CM

## 2020-07-09 NOTE — Assessment & Plan Note (Signed)
Chronic  Clinically euthyroid Check tsh  Titrate med dose if needed  

## 2020-07-09 NOTE — Assessment & Plan Note (Signed)
Chronic Check lipid panel  Continue daily statin Regular exercise and healthy diet encouraged  

## 2020-07-09 NOTE — Assessment & Plan Note (Signed)
Chronic GERD controlled Continue daily medication Pepcid daily

## 2020-07-09 NOTE — Assessment & Plan Note (Signed)
Chronic Overall asthma is well controlled Uses Symbicort once-twice daily and albuterol as needed Can continue-advised if she does hear wheezing to increase Symbicort to twice daily Continue Singulair

## 2020-07-09 NOTE — Assessment & Plan Note (Signed)
Chronic Check a1c Low sugar / carb diet Stressed regular exercise  

## 2020-07-09 NOTE — Assessment & Plan Note (Signed)
Chronic infrequent Denies obvious postnasal drip and feels GERD is controlled Discussed possible ENT evaluation to see if she may have one of the above and not realize it-she will consider this and let me know

## 2020-07-10 LAB — CBC WITH DIFFERENTIAL/PLATELET
Absolute Monocytes: 521 cells/uL (ref 200–950)
Basophils Absolute: 53 cells/uL (ref 0–200)
Basophils Relative: 0.8 %
Eosinophils Absolute: 112 cells/uL (ref 15–500)
Eosinophils Relative: 1.7 %
HCT: 42.4 % (ref 35.0–45.0)
Hemoglobin: 14.3 g/dL (ref 11.7–15.5)
Lymphs Abs: 2046 cells/uL (ref 850–3900)
MCH: 30.5 pg (ref 27.0–33.0)
MCHC: 33.7 g/dL (ref 32.0–36.0)
MCV: 90.4 fL (ref 80.0–100.0)
MPV: 9.7 fL (ref 7.5–12.5)
Monocytes Relative: 7.9 %
Neutro Abs: 3868 cells/uL (ref 1500–7800)
Neutrophils Relative %: 58.6 %
Platelets: 286 10*3/uL (ref 140–400)
RBC: 4.69 10*6/uL (ref 3.80–5.10)
RDW: 13 % (ref 11.0–15.0)
Total Lymphocyte: 31 %
WBC: 6.6 10*3/uL (ref 3.8–10.8)

## 2020-07-10 LAB — LIPID PANEL
Cholesterol: 193 mg/dL (ref <200)
HDL: 63 mg/dL (ref >=50)
LDL Cholesterol (Calc): 101 mg/dL (calc) — ABNORMAL HIGH
Non-HDL Cholesterol (Calc): 130 mg/dL (calc) — ABNORMAL HIGH (ref <130)
Total CHOL/HDL Ratio: 3.1 (calc) (ref <5.0)
Triglycerides: 173 mg/dL — ABNORMAL HIGH (ref <150)

## 2020-07-10 LAB — COMPLETE METABOLIC PANEL WITH GFR
AG Ratio: 1.8 (calc) (ref 1.0–2.5)
ALT: 15 U/L (ref 6–29)
AST: 16 U/L (ref 10–35)
Albumin: 4.5 g/dL (ref 3.6–5.1)
Alkaline phosphatase (APISO): 48 U/L (ref 37–153)
BUN/Creatinine Ratio: 15 (calc) (ref 6–22)
BUN: 15 mg/dL (ref 7–25)
CO2: 25 mmol/L (ref 20–32)
Calcium: 10.1 mg/dL (ref 8.6–10.4)
Chloride: 104 mmol/L (ref 98–110)
Creat: 0.97 mg/dL — ABNORMAL HIGH (ref 0.60–0.93)
GFR, Est African American: 65 mL/min/{1.73_m2} (ref >=60)
GFR, Est Non African American: 56 mL/min/{1.73_m2} — ABNORMAL LOW (ref >=60)
Globulin: 2.5 g/dL (calc) (ref 1.9–3.7)
Glucose, Bld: 95 mg/dL (ref 65–99)
Potassium: 4.5 mmol/L (ref 3.5–5.3)
Sodium: 140 mmol/L (ref 135–146)
Total Bilirubin: 0.7 mg/dL (ref 0.2–1.2)
Total Protein: 7 g/dL (ref 6.1–8.1)

## 2020-07-10 LAB — HEMOGLOBIN A1C
Hgb A1c MFr Bld: 5.4 % of total Hgb (ref <5.7)
Mean Plasma Glucose: 108 (calc)
eAG (mmol/L): 6 (calc)

## 2020-07-10 LAB — TSH: TSH: 2.78 mIU/L (ref 0.40–4.50)

## 2020-07-27 ENCOUNTER — Other Ambulatory Visit: Payer: Self-pay | Admitting: Internal Medicine

## 2020-07-27 DIAGNOSIS — E038 Other specified hypothyroidism: Secondary | ICD-10-CM

## 2020-08-05 ENCOUNTER — Encounter: Payer: Self-pay | Admitting: Internal Medicine

## 2020-08-07 MED ORDER — RAMELTEON 8 MG PO TABS: 8.0000 mg | ORAL_TABLET | Freq: Every day | ORAL | 0 refills | Status: DC

## 2020-08-07 MED ORDER — DIPHENOXYLATE-ATROPINE 2.5-0.025 MG PO TABS: 1.0000 | ORAL_TABLET | Freq: Four times a day (QID) | ORAL | 0 refills | Status: DC | PRN

## 2020-08-15 ENCOUNTER — Other Ambulatory Visit: Payer: Self-pay | Admitting: Internal Medicine

## 2020-08-15 DIAGNOSIS — J45909 Unspecified asthma, uncomplicated: Secondary | ICD-10-CM

## 2020-09-26 ENCOUNTER — Ambulatory Visit: Payer: Medicare Other | Admitting: Neurology

## 2020-09-30 ENCOUNTER — Telehealth: Payer: Self-pay | Admitting: Neurology

## 2020-09-30 NOTE — Telephone Encounter (Signed)
Pt. wants an earlier appt. other than Jan. & would like to speak with RN.

## 2020-09-30 NOTE — Telephone Encounter (Signed)
I called pt. I offered her an appt on 10/03/20 at 9:00am, check in at 8:30am, and pt accepted this appt. Pt verbalized understanding of new appt date and time.

## 2020-10-03 ENCOUNTER — Other Ambulatory Visit: Payer: Self-pay

## 2020-10-03 ENCOUNTER — Encounter: Payer: Medicare Other | Admitting: Adult Health

## 2020-10-03 ENCOUNTER — Ambulatory Visit (INDEPENDENT_AMBULATORY_CARE_PROVIDER_SITE_OTHER): Payer: Medicare Other | Admitting: Neurology

## 2020-10-03 ENCOUNTER — Encounter: Payer: Self-pay | Admitting: Neurology

## 2020-10-03 VITALS — BP 125/73 | HR 65 | Ht 67.0 in | Wt 189.5 lb

## 2020-10-03 DIAGNOSIS — F439 Reaction to severe stress, unspecified: Secondary | ICD-10-CM

## 2020-10-03 DIAGNOSIS — G250 Essential tremor: Secondary | ICD-10-CM

## 2020-10-03 NOTE — Progress Notes (Signed)
Subjective:    Patient ID: Melissa Lowe is a 79 y.o. female.  HPI     Interim history:   Melissa Lowe is a 79 year old right-handed woman with an underlying medical history of asthma, hypothyroidism and hyperlipidemia, who presents for follow-up consultation of her essential tremor.  The patient is unaccompanied today.  I last saw her on 09/27/2019, At which time she felt stable.  She had a left knee injury in the interim in September 2020 and had surgery in November 2020.  She was advised to follow-up in the year for recheck on her tremors.  She was not on any medication for tremor control.  She reported that since quarantining her tremor was not as apparent to her or did not seem to affect her as much.   Today, 10/03/20: She reports going through some stressful times in the past several months.  They had water damage in their home and needed major work done.  In addition, her husband has been diagnosed with prostate cancer in his about to start treatment for this including radiation therapy, she is apprehensive about this but he seems to be in good spirits thankfully.  She has noticed an exacerbation in her tremor, attributes this to stress.  She has continued with her healthy lifestyle, tries to eat right, maintains exercise regimen in the form of walking primarily and tries to hydrate well.  She has essentially quit drinking caffeine in the form of iced tea some 3 years ago.  She has occasional hot chocolate.  She does not care for coffee in general but has read about coffee being favorable or preventative against dementia.  She is exploring this idea.  Her mom had dementia.    The patient's allergies, current medications, family history, past medical history, past social history, past surgical history and problem list were reviewed and updated as appropriate.    Previously (copied from previous notes for reference):    I saw her on 03/17/2018, at which time she felt stable, her exam was  also fairly stable, we talked about her tremor triggers and mutually agreed to follow her clinically.    I saw her on 02/23/2017, at which time we talked about tremor triggers. In her case caffeine and sleep deprivation seem to be triggers. She felt she was fairly stable. She reported left hip pain and was status post injection in March 2018. She had some increased allergy symptoms, asthma was stable, she had reflux issues, had stopped her PPI. I suggested a one-year checkup.     I saw her on 02/20/2016, at which time she reported feeling stable. On examination, her tremor was stable. She had no signs of parkinsonism. She reported that she did genetic testing through 1 and me and reported having markers for kidney disease, lung disease and Alzheimer's disease. We mutually agreed to hold off on symptomatic treatment of her tremors. I suggested one-year checkup.   I first met her on 02/05/2015, at which time she reported feeling stable, no significant worsening of her tremors. She was not impaired in her day-to-day functioning, her ADLs. She felt that her left hand was worse than the right. She had lip quiver but no voice tremor. Exam appeared to be stable. She had no balance issues and thankfully no recent falls. We mutually agreed to monitor her symptoms and not try any medication but we did talk about potentially trying Mysoline down the [place]. She worried about potential side effects. I suggested one-year checkup.  She previously saw Dr. Jim Like and was last seen by him on 07/04/2014 at which time he suggested a 6 mo follow-up and observation, as her tremors were mild. Prior to that, she had seen Dr. Erling Cruz, last in December 2013. She had a new onset lip tremor in or around 2010 which was progressive and then developed hand tremors, left more than right as well as difficulty holding something such as a fork or bulk. She did not have much in the way of improvement with alcohol consumption. She did note  that asthma medication made her tremors worse. She does have a family history of tremors in her mother, who developed a tremor in her 50s.   Her Past Medical History Is Significant For: Past Medical History:  Diagnosis Date  . Allergy   . Asthma   . Diverticulosis   . Fibroid   . Hyperlipidemia   . Hypothyroidism   . IBS (irritable bowel syndrome)   . PAC (premature atrial contraction) 09/22/2016    Her Past Surgical History Is Significant For: Past Surgical History:  Procedure Laterality Date  . ABDOMINAL HYSTERECTOMY     2  fibroids  . ANTERIOR AND POSTERIOR REPAIR N/A 01/30/2013   Procedure:  POSTERIOR REPAIR (RECTOCELE);  Surgeon: Princess Bruins, MD;  Location: [facility];  Service: Gynecology;  Laterality: N/A;  . arthoscopy Left 08/2018   L knee repair torn meniscus  . CATARACT EXTRACTION    . CHOLECYSTECTOMY    . colon polypectomy  2001   Dr Earlean Shawl  . COLONOSCOPY  12/25/13   Tics , Dr Lajoyce Lauber 2020  . EYE SURGERY    . KNEE SURGERY Left 2020   MENISCUS REPAIR  . REPAIR FASCIAL DEFECT LEG  1972  . SEPTOPLASTY    . THIGH FASCIOTOMY      Her Family History Is Significant For: Family History  Problem Relation Age of Onset  . Dementia Mother   . Cancer Father   . Asthma Sister   . Breast cancer Sister   . Stroke Maternal Grandmother        in 6s  . Stroke Paternal Grandmother 89  . Cancer Maternal Grandfather   . Hypertension Neg Hx   . Heart disease Neg Hx     Her Social History Is Significant For: Social History   Socioeconomic History  . Marital status: Married    Spouse name: Melissa Lowe  . Number of children: 0  . Years of education: Master's   . Highest education level: Not on file  Occupational History  . Not on file  Tobacco Use  . Smoking status: Never Smoker  . Smokeless tobacco: Never Used  Vaping Use  . Vaping Use: Never used  Substance and Sexual Activity  . Alcohol use: Yes    Comment: Wine couple days weekly  . Drug use: No  .  Sexual activity: Yes    Partners: Male    Comment: 1ST INTERCOURSE- ?   PARTNERS- 3  Other Topics Concern  . Not on file  Social History Narrative   Patient lives at home with her husband Melissa Lowe.    Patient has a Master's Degree and post grad.    Patient has no children.    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   Social Determinants of Health   Financial Resource Strain: Not on file  Food Insecurity: Not on file  Transportation Needs: Not on file  Physical Activity: Not on file  Stress: Not  on file  Social Connections: Not on file    Her Allergies Are:  Allergies  Allergen Reactions  . Sulfonamide Derivatives     Rash on inside of arms Because of a history of documented adverse serious drug reaction;Medi Alert bracelet  is recommended  . Prednisone Other (See Comments)    ? allergy  . Tetracycline     Severe itching on lower leg  :   Her Current Medications Are:  Outpatient Encounter Medications as of 10/03/2020  Medication Sig  . albuterol (PROAIR HFA) 108 (90 Base) MCG/ACT inhaler Inhale 1 puff into the lungs every 6 (six) hours as needed for wheezing or shortness of breath.  . Ascorbic Acid (VITAMIN C) 500 MG tablet Take 500 mg by mouth daily.  . bimatoprost (LATISSE) 0.03 % ophthalmic solution Place into both eyes. apply evenly along the skin of the upper eyelid at base of eyelashes 3xs weekly at bedtime; repeat procedure for second eye (use a clean applicator).  . budesonide-formoterol (SYMBICORT) 80-4.5 MCG/ACT inhaler USE 1 INHALATION BY MOUTH  DAILY PER THE MANUFACTURER,  EXPIRES 3 MONTHS AFTER  OPENING OVERWRAP  . Cholecalciferol (VITAMIN D3) 1000 UNITS CAPS Take by mouth daily.  Marland Kitchen ELDERBERRY PO Take by mouth. Bucoli Liquid.  . famotidine (PEPCID) 20 MG tablet Take 40 mg by mouth daily.   Marland Kitchen levothyroxine (SYNTHROID) 50 MCG tablet WTAKE 1 TABLET BY MOUTH IN  THE MORNING  . montelukast (SINGULAIR) 10 MG tablet TAKE 1 TABLET BY MOUTH  DAILY  .  Multiple Vitamin (MULTIVITAMIN) capsule Take 1 capsule by mouth daily.  . NON FORMULARY Sleep aid for travel. Possibly Rozerem?  . pravastatin (PRAVACHOL) 40 MG tablet TAKE 1 TABLET BY MOUTH AT  BEDTIME  . diphenoxylate-atropine (LOMOTIL) 2.5-0.025 MG tablet Take 1 tablet by mouth 4 (four) times daily as needed for diarrhea or loose stools. (Patient not taking: Reported on 10/03/2020)  . ramelteon (ROZEREM) 8 MG tablet Take 1 tablet (8 mg total) by mouth at bedtime. (Patient not taking: Reported on 10/03/2020)   No facility-administered encounter medications on file as of 10/03/2020.  :  Review of Systems:  Out of a complete 14 point review of systems, all are reviewed and negative with the exception of these symptoms as listed below: Review of Systems  Neurological:       Rm 1, One year FU for tremors    Objective:  Neurological Exam  Physical Exam Physical Examination:   Vitals:   10/03/20 0940  BP: 125/73  Pulse: 65    General Examination: The patient is a very pleasant 79 y.o. female in no acute distress. She appears well-developed and well-nourished and well groomed.   HEENT:Normocephalic, atraumatic, pupils are equal, round and reactive to light. Extraocular tracking is good without limitation to gaze excursion or nystagmus noted. Normal smooth pursuit is noted. Hearing is grossly intact. Face is symmetric with normal facial animation and normal facial sensation. Speech is clear with no dysarthria noted. There is no hypophonia. There is no voice tremor but she has alower lip and jaw tremor. She has an intermittent head tremor. Head tremor seems a little worse.   Chest:Clear to auscultation without wheezing, rhonchi or crackles noted.  Heart:S1+S2+0, regular and normal without murmurs, rubs or gallops noted.   Abdomen:Soft, non-tender and non-distended with normal bowel sounds appreciated on auscultation.  Extremities:There is noobvious change.  Skin: Warm and  dry without trophic changes noted.  Musculoskeletal: exam reveals no obv. Joint deformity.   Neurologically:  Mental status: The patient is awake, alert and oriented in all 4 spheres. Her immediate and remote memory, attention, language skills and fund of knowledge are appropriate. There is no evidence of aphasia, agnosia, apraxia or anomia. Speech is clear with normal prosody and enunciation. Thought process is linear. Mood is normal and affect is normal.  Cranial nerves II - XII are as described above under HEENT exam.  Motor exam: Normal bulk, slight resting tremor, intermittent, in both UEs. She has a mild bilateral upper extremity postural hand tremor, L more than R, minimal action tremor, no intention tremor. Fine motor skills and coordination:grosslyintactand well preserved.  Cerebellar testing: No dysmetria or intention tremor, no gait ataxia.  Sensory exam: intact to light touch in the upper and lower extremities.  Gait, station and balance: She stands easily. No veering to one side is noted. No leaning to one side is noted. Posture is age-appropriate and stance is narrow based. Gait shows normal stride length and normal pace. No problems turning are noted. She has preserved arm swing when walking.  Assessment and Plan:   In summary, Melissa Lowe is a very pleasant 79 year old female with an underlying medical history of asthma,allergies, reflux disease,hypothyroidism,and obesity,who presents for follow-up consultation of herlong-standing history of essential tremor. She has a family history of tremors. Findings have been stable over the years, however, in the past few months she has noticed a flareup, she has had more stress, but with her water damage at the house and also concerns about her  husband's health.  He is about to start treatment for prostate cancer and she is obviously stressed and apprehensive about it.  Her tremor is a little bit more noticeable on exam  today.  We talked about tremor triggers.  We talked about maintaining a healthy lifestyle.  She has done well with that.  She is advised that we could try her on a small dose of Mysoline such as half a pill once daily.  We talked about expectations and common side effects of this medication.  I would not favor trying her on a beta-blocker what with her longstanding history of asthma.  She is reluctant to add any new medication at this time especially for fear of side effects and she does not want to risk having any issues herself when she wants to be there for her husband as a support and caretaker since he is starting radiation therapy soon.  We mutually agreed to reevaluate things in 1 year, sooner if needed and if she would like to start a trial of Mysoline low-dose in the interim, she is encouraged to call or email through [facility].  I answered all her questions today and she was in agreement.  I spent 30 minutes in total face-to-face time and in reviewing records during pre-charting, more than 50% of which was spent in counseling and coordination of care, reviewing test results, reviewing medications and treatment regimen and/or in discussing or reviewing the diagnosis of ET, the prognosis and treatment options. Pertinent laboratory and imaging test results that were available during this visit with the patient were reviewed by me and considered in my medical decision making (see chart for details).

## 2020-10-03 NOTE — Patient Instructions (Signed)
It was good to see you again today.  Your tremor is indeed a little worse compared to last year.  As discussed, stress can be a big trigger for the tremor exacerbation.  I do believe you have been going through with stressful times at this moment and sincerely hope that your husband's treatments will go well.  As discussed, we can continue to monitor your exam and your symptoms for now but if need be, we can try you on a low-dose of primidone such as half a pill of the 50 mg strength.  Please keep this in mind and let me know if you would like to start a trial, we can always call in the prescription for you.  For now, let us plan on rechecking everything in 1 year, sooner if we need to.

## 2020-10-07 ENCOUNTER — Telehealth (INDEPENDENT_AMBULATORY_CARE_PROVIDER_SITE_OTHER): Payer: Medicare Other | Admitting: Internal Medicine

## 2020-10-07 ENCOUNTER — Encounter: Payer: Self-pay | Admitting: Internal Medicine

## 2020-10-07 DIAGNOSIS — R059 Cough, unspecified: Secondary | ICD-10-CM | POA: Diagnosis not present

## 2020-10-07 MED ORDER — HYDROCOD POLST-CPM POLST ER 10-8 MG/5ML PO SUER
5.0000 mL | Freq: Two times a day (BID) | ORAL | 0 refills | Status: DC | PRN
Start: 2020-10-07 — End: 2022-08-06

## 2020-10-07 NOTE — Progress Notes (Signed)
Virtual Visit via Video Note  I connected with Melissa Lowe on 10/07/20 at  3:45 PM EST by a video enabled telemedicine application and verified that I am speaking with the correct person using two identifiers.   I discussed the limitations of evaluation and management by telemedicine and the availability of in person appointments. The patient expressed understanding and agreed to proceed.  Present for the visit:  Myself, Dr Billey Gosling, Melissa Lowe and her husband Melissa Lowe is present.  The patient is currently at home and I am in the office.    No referring provider.    History of Present Illness: This is an acute visit for cough.  Her husband had a cold 2-3 weeks ago and gave it to her.  Most of her symptoms have improved, but she is having a productive cough.  The amount of phlegm is decreasing and she feels her cough is improving.  She has had some headaches, but denies any fevers, shortness of breath or wheezing.  Her other cold symptoms have resolved.  She is using her Symbicort daily-she takes 2 puffs.  She has not needed the albuterol.    Review of Systems  Constitutional: Negative for fever.  HENT: Negative for congestion, sinus pain and sore throat.   Respiratory: Positive for cough and sputum production. Negative for shortness of breath and wheezing.   Cardiovascular: Negative for chest pain.  Neurological: Positive for headaches.     Social History   Socioeconomic History  . Marital status: Married    Spouse name: Melissa Lowe  . Number of children: 0  . Years of education: Master's   . Highest education level: Not on file  Occupational History  . Not on file  Tobacco Use  . Smoking status: Never Smoker  . Smokeless tobacco: Never Used  Vaping Use  . Vaping Use: Never used  Substance and Sexual Activity  . Alcohol use: Yes    Comment: Wine couple days weekly  . Drug use: No  . Sexual activity: Yes    Partners: Male    Comment: 1ST INTERCOURSE- ?    PARTNERS- 3  Other Topics Concern  . Not on file  Social History Narrative   Patient lives at home with her husband Melissa Lowe.    Patient has a Master's Degree and post grad.    Patient has no children.    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   Social Determinants of Health   Financial Resource Strain: Not on file  Food Insecurity: Not on file  Transportation Needs: Not on file  Physical Activity: Not on file  Stress: Not on file  Social Connections: Not on file     Observations/Objective: Appears well in NAD Breathing normally Skin appears warm and dry  Assessment and Plan:  See Problem List for Assessment and Plan of chronic medical problems.   Follow Up Instructions:    I discussed the assessment and treatment plan with the patient. The patient was provided an opportunity to ask questions and all were answered. The patient agreed with the plan and demonstrated an understanding of the instructions.   The patient was advised to call back or seek an in-person evaluation if the symptoms worsen or if the condition fails to improve as anticipated.    Binnie Rail, MD

## 2020-10-07 NOTE — Assessment & Plan Note (Signed)
Acute Post viral in nature Her and her husband both had colds a couple of weeks ago-no symptoms have improved She is having a little bit of a productive cough and she states the mucus is improving and decreasing in amount No fevers or other concerning symptoms Probable slight flare of asthma Would like to avoid oral steroids, which I think is reasonable She is using her Symbicort and she will continue to use this Will prescribe Tussionex to help with sleep Can take Mucinex during the day Call if no improvement

## 2020-10-09 ENCOUNTER — Ambulatory Visit: Payer: Medicare Other | Admitting: Neurology

## 2020-10-09 NOTE — Progress Notes (Signed)
This encounter was created in error - please disregard.

## 2020-12-11 ENCOUNTER — Telehealth: Payer: Self-pay | Admitting: Internal Medicine

## 2020-12-11 NOTE — Telephone Encounter (Signed)
Spoke with patient today. 

## 2020-12-11 NOTE — Telephone Encounter (Signed)
Patient found out today her husband tested positive for covid. They had lunch with their friend yesterday and told them he had covid so her husband got tested and he is positive. She is wondering what she should do from here because of her health condition with asthma. [phone]

## 2020-12-19 ENCOUNTER — Ambulatory Visit: Payer: Medicare Other | Admitting: Obstetrics & Gynecology

## 2021-01-07 ENCOUNTER — Telehealth: Payer: Self-pay | Admitting: Internal Medicine

## 2021-01-07 DIAGNOSIS — H9190 Unspecified hearing loss, unspecified ear: Secondary | ICD-10-CM

## 2021-01-07 NOTE — Telephone Encounter (Signed)
Patient requesting referral to [facility] for hearing eval  Phone [phone] Fax [phone]

## 2021-01-20 NOTE — Progress Notes (Signed)
Subjective:    Patient ID: Melissa Lowe, female    DOB: 1941/01/07, 80 y.o.   MRN: [id number]  HPI The patient is here for follow up of their chronic medical problems, including hyperlipidemia, prediabetes, hypothyroidism, GERD, asthma  She does not sleep well.  Can not get back to sleep after going to the bathroom.  Tinnitus in R ear getting worse.  Can affect sleep.  Has appt with audiology.  It is intermittent, but louder at night.    Asthma controlled.    Takes 2 ASA less than once a week.  She denies taking other NSAIDs.  She is good about getting a good amount of fluids in the day.  Medications and allergies reviewed with patient and updated if appropriate.  Patient Active Problem List   Diagnosis Date Noted  . Cough 10/07/2020  . Throat clearing 07/09/2020  . Internal hemorrhoids 02/23/2018  . Plantar fasciitis of left foot 12/29/2017  . Hypothyroidism 01/12/2017  . GERD (gastroesophageal reflux disease) 01/12/2017  . PAC (premature atrial contraction) 09/22/2016  . Fatty liver, mild 05/19/2016  . Prediabetes 05/14/2016  . Benign essential tremor 05/14/2016  . Cystocele with prolapse 05/14/2016  . LIPOMA 03/10/2010  . Tinnitus 03/10/2010  . DIVERTICULOSIS, COLON 10/18/2008  . Hyperlipidemia 08/08/2007  . HIP PAIN 08/08/2007  . Asthma without acute exacerbation 08/02/2007  . Personal history of colonic polyps 08/02/2007    Current Outpatient Medications on File Prior to Visit  Medication Sig Dispense Refill  . albuterol (PROAIR HFA) 108 (90 Base) MCG/ACT inhaler Inhale 1 puff into the lungs every 6 (six) hours as needed for wheezing or shortness of breath. 18 g 3  . Ascorbic Acid (VITAMIN C) 500 MG tablet Take 500 mg by mouth daily.    . bimatoprost (LATISSE) 0.03 % ophthalmic solution Place into both eyes. apply evenly along the skin of the upper eyelid at base of eyelashes 3xs weekly at bedtime; repeat procedure for second eye (use a clean applicator).     . budesonide-formoterol (SYMBICORT) 80-4.5 MCG/ACT inhaler USE 1 INHALATION BY MOUTH  DAILY PER THE MANUFACTURER,  EXPIRES 3 MONTHS AFTER  OPENING OVERWRAP 10.2 g 3  . Cholecalciferol (VITAMIN D3) 1000 UNITS CAPS Take by mouth daily.    Marland Kitchen ELDERBERRY PO Take by mouth. Bucoli Liquid.    . famotidine (PEPCID) 20 MG tablet Take 40 mg by mouth daily.     Marland Kitchen levothyroxine (SYNTHROID) 50 MCG tablet WTAKE 1 TABLET BY MOUTH IN  THE MORNING 90 tablet 3  . montelukast (SINGULAIR) 10 MG tablet TAKE 1 TABLET BY MOUTH  DAILY 90 tablet 3  . Multiple Vitamin (MULTIVITAMIN) capsule Take 1 capsule by mouth daily.    . NON FORMULARY Sleep aid for travel. Possibly Rozerem?    . pravastatin (PRAVACHOL) 40 MG tablet TAKE 1 TABLET BY MOUTH AT  BEDTIME 90 tablet 3   No current facility-administered medications on file prior to visit.    Past Medical History:  Diagnosis Date  . Allergy   . Asthma   . Diverticulosis   . Fibroid   . Hyperlipidemia   . Hypothyroidism   . IBS (irritable bowel syndrome)   . PAC (premature atrial contraction) 09/22/2016    Past Surgical History:  Procedure Laterality Date  . ABDOMINAL HYSTERECTOMY     2  fibroids  . ANTERIOR AND POSTERIOR REPAIR N/A 01/30/2013   Procedure:  POSTERIOR REPAIR (RECTOCELE);  Surgeon: Princess Bruins, MD;  Location: [facility];  Service:  Gynecology;  Laterality: N/A;  . arthoscopy Left 08/2018   L knee repair torn meniscus  . CATARACT EXTRACTION    . CHOLECYSTECTOMY    . colon polypectomy  2001   Dr Earlean Shawl  . COLONOSCOPY  12/25/13   Tics , Dr Lajoyce Lauber 2020  . EYE SURGERY    . KNEE SURGERY Left 2020   MENISCUS REPAIR  . REPAIR FASCIAL DEFECT LEG  1972  . SEPTOPLASTY    . THIGH FASCIOTOMY      Social History   Socioeconomic History  . Marital status: Married    Spouse name: [name]  . Number of children: 0  . Years of education: Master's   . Highest education level: Not on file  Occupational History  . Not on file  Tobacco Use  .  Smoking status: Never Smoker  . Smokeless tobacco: Never Used  Vaping Use  . Vaping Use: Never used  Substance and Sexual Activity  . Alcohol use: Yes    Comment: Wine couple days weekly  . Drug use: No  . Sexual activity: Yes    Partners: Male    Comment: 1ST INTERCOURSE- ?   PARTNERS- 3  Other Topics Concern  . Not on file  Social History Narrative   Patient lives at home with her husband [name].    Patient has a Master's Degree and post grad.    Patient has no children.    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   Social Determinants of Health   Financial Resource Strain: Not on file  Food Insecurity: Not on file  Transportation Needs: Not on file  Physical Activity: Not on file  Stress: Not on file  Social Connections: Not on file    Family History  Problem Relation Age of Onset  . Dementia Mother   . Cancer Father   . Asthma Sister   . Breast cancer Sister   . Stroke Maternal Grandmother        in 13s  . Stroke Paternal Grandmother 89  . Cancer Maternal Grandfather   . Hypertension Neg Hx   . Heart disease Neg Hx     Review of Systems  Constitutional: Negative for chills and fever.  HENT: Positive for tinnitus.   Respiratory: Negative for cough, shortness of breath and wheezing.   Cardiovascular: Negative for chest pain, palpitations and leg swelling.  Neurological: Positive for tremors and headaches (related to vision). Negative for light-headedness.       Objective:   Vitals:   01/21/21 0938  BP: 120/68  Pulse: 62  Temp: 98.1 F (36.7 C)  SpO2: 96%   BP Readings from Last 3 Encounters:  01/21/21 120/68  10/03/20 125/73  07/09/20 112/62   Wt Readings from Last 3 Encounters:  01/21/21 187 lb (84.8 kg)  10/03/20 189 lb 8 oz (86 kg)  07/09/20 186 lb 12.8 oz (84.7 kg)   Body mass index is 29.29 kg/m.   Physical Exam    Constitutional: Appears well-developed and well-nourished. No distress.  HENT:  Head: Normocephalic and  atraumatic.  Neck: Neck supple. No tracheal deviation present. No thyromegaly present.  No cervical lymphadenopathy Cardiovascular: Normal rate, regular rhythm and normal heart sounds.   No murmur heard. No carotid bruit .  No edema Pulmonary/Chest: Effort normal and breath sounds normal. No respiratory distress. No has no wheezes. No rales.  Skin: Skin is warm and dry. Not diaphoretic.  Psychiatric: Normal mood and affect. Behavior is normal.  Assessment & Plan:    See Problem List for Assessment and Plan of chronic medical problems.    This visit occurred during the SARS-CoV-2 public health emergency.  Safety protocols were in place, including screening questions prior to the visit, additional usage of staff PPE, and extensive cleaning of exam room while observing appropriate contact time as indicated for disinfecting solutions.

## 2021-01-20 NOTE — Patient Instructions (Addendum)
    Blood work was ordered.      Medications changes include :   none     Please followup in 6 months  

## 2021-01-21 ENCOUNTER — Other Ambulatory Visit: Payer: Self-pay

## 2021-01-21 ENCOUNTER — Encounter: Payer: Self-pay | Admitting: Internal Medicine

## 2021-01-21 ENCOUNTER — Ambulatory Visit: Payer: Medicare Other | Admitting: Internal Medicine

## 2021-01-21 VITALS — BP 120/68 | HR 62 | Temp 98.1°F | Ht 67.0 in | Wt 187.0 lb

## 2021-01-21 DIAGNOSIS — J45909 Unspecified asthma, uncomplicated: Secondary | ICD-10-CM | POA: Diagnosis not present

## 2021-01-21 DIAGNOSIS — E038 Other specified hypothyroidism: Secondary | ICD-10-CM | POA: Diagnosis not present

## 2021-01-21 DIAGNOSIS — R944 Abnormal results of kidney function studies: Secondary | ICD-10-CM | POA: Diagnosis not present

## 2021-01-21 DIAGNOSIS — K219 Gastro-esophageal reflux disease without esophagitis: Secondary | ICD-10-CM

## 2021-01-21 DIAGNOSIS — E7849 Other hyperlipidemia: Secondary | ICD-10-CM

## 2021-01-21 DIAGNOSIS — R7303 Prediabetes: Secondary | ICD-10-CM

## 2021-01-21 LAB — COMPREHENSIVE METABOLIC PANEL
ALT: 14 U/L (ref 0–35)
AST: 15 U/L (ref 0–37)
Albumin: 4.4 g/dL (ref 3.5–5.2)
Alkaline Phosphatase: 44 U/L (ref 39–117)
BUN: 14 mg/dL (ref 6–23)
CO2: 28 mEq/L (ref 19–32)
Calcium: 9.8 mg/dL (ref 8.4–10.5)
Chloride: 105 mEq/L (ref 96–112)
Creatinine, Ser: 0.92 mg/dL (ref 0.40–1.20)
GFR: 60 mL/min — ABNORMAL LOW (ref >60.00)
Glucose, Bld: 96 mg/dL (ref 70–99)
Potassium: 4.3 mEq/L (ref 3.5–5.1)
Sodium: 140 mEq/L (ref 135–145)
Total Bilirubin: 0.6 mg/dL (ref 0.2–1.2)
Total Protein: 6.9 g/dL (ref 6.0–8.3)

## 2021-01-21 LAB — TSH: TSH: 3.55 u[IU]/mL (ref 0.35–4.50)

## 2021-01-21 LAB — MICROALBUMIN / CREATININE URINE RATIO
Creatinine,U: 163.4 mg/dL
Microalb Creat Ratio: 0.4 mg/g (ref 0.0–30.0)
Microalb, Ur: 0.7 mg/dL (ref 0.0–1.9)

## 2021-01-21 LAB — LIPID PANEL
Cholesterol: 169 mg/dL (ref 0–200)
HDL: 62.1 mg/dL (ref >39.00)
LDL Cholesterol: 76 mg/dL (ref 0–99)
NonHDL: 106.57
Total CHOL/HDL Ratio: 3
Triglycerides: 152 mg/dL — ABNORMAL HIGH (ref 0.0–149.0)
VLDL: 30.4 mg/dL (ref 0.0–40.0)

## 2021-01-21 LAB — HEMOGLOBIN A1C: Hgb A1c MFr Bld: 5.7 % (ref 4.6–6.5)

## 2021-01-21 NOTE — Assessment & Plan Note (Signed)
Chronic  Clinically euthyroid Currently taking levothyroxine 50 mcg daily Check tsh  Titrate med dose if needed  

## 2021-01-21 NOTE — Assessment & Plan Note (Signed)
Chronic Overall asthma is well controlled Uses Symbicort twice daily and albuterol as needed, which is not often Continue above Continue Singulair 10 mg nightly Lungs clear here today

## 2021-01-21 NOTE — Assessment & Plan Note (Signed)
Chronic Check a1c Low sugar / carb diet Stressed regular exercise  

## 2021-01-21 NOTE — Assessment & Plan Note (Signed)
Chronic Check lipid panel  Continue pravastatin 40 mg daily Regular exercise and healthy diet encouraged  

## 2021-01-21 NOTE — Assessment & Plan Note (Signed)
Chronic GERD controlled Continue famotidine 40 mg daily 

## 2021-01-27 ENCOUNTER — Ambulatory Visit: Payer: Medicare Other

## 2021-02-05 ENCOUNTER — Other Ambulatory Visit: Payer: Self-pay

## 2021-02-05 ENCOUNTER — Other Ambulatory Visit (HOSPITAL_BASED_OUTPATIENT_CLINIC_OR_DEPARTMENT_OTHER): Payer: Self-pay

## 2021-02-05 ENCOUNTER — Ambulatory Visit: Payer: Medicare Other | Attending: Internal Medicine

## 2021-02-05 DIAGNOSIS — Z23 Encounter for immunization: Secondary | ICD-10-CM

## 2021-02-05 NOTE — Progress Notes (Signed)
   Covid-19 Vaccination Clinic  Name:  Melissa Lowe    MRN: [id number] DOB: 24-May-1941  02/05/2021  [name] was observed post Covid-19 immunization for 15 minutes without incident. She was provided with Vaccine Information Sheet and instruction to access the V-Safe system.   [name] was instructed to call 911 with any severe reactions post vaccine: Marland Kitchen Difficulty breathing  . Swelling of face and throat  . A fast heartbeat  . A bad rash all over body  . Dizziness and weakness   Immunizations Administered    Name Date Dose VIS Date Route   PFIZER Comrnaty(Gray TOP) Covid-19 Vaccine 02/05/2021 12:01 PM 0.3 mL 10/03/2020 Intramuscular   Manufacturer: [employer]   Lot: [id number]   NDC: [phone]

## 2021-02-07 ENCOUNTER — Other Ambulatory Visit (HOSPITAL_BASED_OUTPATIENT_CLINIC_OR_DEPARTMENT_OTHER): Payer: Self-pay

## 2021-02-07 MED ORDER — COVID-19 MRNA VACCINE (PFIZER) 30 MCG/0.3ML IM SUSP
INTRAMUSCULAR | 0 refills | Status: DC
Start: 2021-02-05 — End: 2021-08-04
  Filled 2021-02-07: qty 0.3, 1d supply, fill #0

## 2021-02-28 ENCOUNTER — Telehealth: Payer: Self-pay | Admitting: Internal Medicine

## 2021-02-28 NOTE — Telephone Encounter (Signed)
Spoke with patient today. Tested positive today but not symptoms.  Patient advised to monitor her symptoms and can use OTC meds for mild symptoms.

## 2021-02-28 NOTE — Telephone Encounter (Signed)
Patient calling she wants to hear from Dr. Quay Burow to tell her about quarantine and what they should do because she is covid positive but has no symptoms.

## 2021-05-01 ENCOUNTER — Telehealth: Payer: Self-pay | Admitting: Internal Medicine

## 2021-05-01 NOTE — Telephone Encounter (Signed)
Patient declined AWV 

## 2021-05-14 ENCOUNTER — Other Ambulatory Visit: Payer: Self-pay | Admitting: Obstetrics & Gynecology

## 2021-05-14 DIAGNOSIS — Z1231 Encounter for screening mammogram for malignant neoplasm of breast: Secondary | ICD-10-CM

## 2021-06-19 ENCOUNTER — Ambulatory Visit: Payer: Self-pay | Admitting: Obstetrics & Gynecology

## 2021-06-28 ENCOUNTER — Ambulatory Visit
Admission: RE | Admit: 2021-06-28 | Discharge: 2021-06-28 | Disposition: A | Discharge status: Home / Self Care | Payer: Medicare Other | Source: Ambulatory Visit | Attending: Obstetrics & Gynecology | Admitting: Obstetrics & Gynecology

## 2021-06-28 ENCOUNTER — Other Ambulatory Visit: Payer: Self-pay

## 2021-06-28 DIAGNOSIS — Z1231 Encounter for screening mammogram for malignant neoplasm of breast: Secondary | ICD-10-CM

## 2021-07-01 ENCOUNTER — Encounter: Payer: Self-pay | Admitting: Internal Medicine

## 2021-07-02 ENCOUNTER — Other Ambulatory Visit: Payer: Self-pay | Admitting: Internal Medicine

## 2021-07-02 DIAGNOSIS — E038 Other specified hypothyroidism: Secondary | ICD-10-CM

## 2021-07-08 ENCOUNTER — Ambulatory Visit: Payer: Medicare Other

## 2021-07-24 ENCOUNTER — Other Ambulatory Visit: Payer: Self-pay | Admitting: Internal Medicine

## 2021-07-24 ENCOUNTER — Ambulatory Visit: Payer: Medicare Other | Admitting: Internal Medicine

## 2021-07-24 DIAGNOSIS — J45909 Unspecified asthma, uncomplicated: Secondary | ICD-10-CM

## 2021-08-03 DIAGNOSIS — N1831 Chronic kidney disease, stage 3a: Secondary | ICD-10-CM | POA: Insufficient documentation

## 2021-08-03 DIAGNOSIS — N182 Chronic kidney disease, stage 2 (mild): Secondary | ICD-10-CM | POA: Insufficient documentation

## 2021-08-03 DIAGNOSIS — N183 Chronic kidney disease, stage 3 unspecified: Secondary | ICD-10-CM | POA: Insufficient documentation

## 2021-08-03 NOTE — Progress Notes (Signed)
Subjective:    Patient ID: Melissa Lowe, female    DOB: June 16, 1941, 80 y.o.   MRN: [id number]  This visit occurred during the SARS-CoV-2 public health emergency.  Safety protocols were in place, including screening questions prior to the visit, additional usage of staff PPE, and extensive cleaning of exam room while observing appropriate contact time as indicated for disinfecting solutions.     HPI The patient is here for follow up of their chronic medical problems, including hld, prediabetes, hypothyroid, ckd, GERD, asthma  Walking 3/week.    At times she can not get a full deep breath.    After getting hearing aids this past spring had two episodes of vertigo.  She has tinnitiis.  The dizziness is better after adjustment of hearing aids.  She is working on the tinnitus with this pillow that has been present when she listens to certain sounds.  She think it has helped some.     Medications and allergies reviewed with patient and updated if appropriate.  Patient Active Problem List   Diagnosis Date Noted   CKD (chronic kidney disease) stage 3, GFR 30-59 ml/min (HCC) 08/03/2021   Cough 10/07/2020   Throat clearing 07/09/2020   Internal hemorrhoids 02/23/2018   Hypothyroidism 01/12/2017   GERD (gastroesophageal reflux disease) 01/12/2017   PAC (premature atrial contraction) 09/22/2016   Fatty liver, mild 05/19/2016   Prediabetes 05/14/2016   Benign essential tremor 05/14/2016   Cystocele with prolapse 05/14/2016   LIPOMA 03/10/2010   Tinnitus 03/10/2010   DIVERTICULOSIS, COLON 10/18/2008   Hyperlipidemia 08/08/2007   HIP PAIN 08/08/2007   Asthma without acute exacerbation 08/02/2007   Personal history of colonic polyps 08/02/2007    Current Outpatient Medications on File Prior to Visit  Medication Sig Dispense Refill   albuterol (PROAIR HFA) 108 (90 Base) MCG/ACT inhaler Inhale 1 puff into the lungs every 6 (six) hours as needed for wheezing or shortness of  breath. 18 g 3   bimatoprost (LATISSE) 0.03 % ophthalmic solution Place into both eyes. apply evenly along the skin of the upper eyelid at base of eyelashes 3xs weekly at bedtime; repeat procedure for second eye (use a clean applicator).     budesonide-formoterol (SYMBICORT) 80-4.5 MCG/ACT inhaler USE 1 INHALATION BY MOUTH  DAILY PER THE MANUFACTURER,  EXPIRES 3 MONTHS AFTER  OPENING OVERWRAP 10.2 g 3   ELDERBERRY PO Take by mouth. Bucoli Liquid.     famotidine (PEPCID) 20 MG tablet Take 40 mg by mouth daily.      levothyroxine (SYNTHROID) 50 MCG tablet TAKE 1 TABLET BY MOUTH IN  THE MORNING 90 tablet 3   montelukast (SINGULAIR) 10 MG tablet TAKE 1 TABLET BY MOUTH  DAILY 90 tablet 3   pravastatin (PRAVACHOL) 40 MG tablet TAKE 1 TABLET BY MOUTH AT  BEDTIME 90 tablet 3   No current facility-administered medications on file prior to visit.    Past Medical History:  Diagnosis Date   Allergy    Asthma    Diverticulosis    Fibroid    Hyperlipidemia    Hypothyroidism    IBS (irritable bowel syndrome)    PAC (premature atrial contraction) 09/22/2016    Past Surgical History:  Procedure Laterality Date   ABDOMINAL HYSTERECTOMY     2  fibroids   ANTERIOR AND POSTERIOR REPAIR N/A 01/30/2013   Procedure:  POSTERIOR REPAIR (RECTOCELE);  Surgeon: Princess Bruins, MD;  Location: [facility];  Service: Gynecology;  Laterality: N/A;  arthoscopy Left 08/2018   L knee repair torn meniscus   CATARACT EXTRACTION     CHOLECYSTECTOMY     colon polypectomy  2001   Dr Earlean Shawl   COLONOSCOPY  12/25/13   Tics , Dr Lajoyce Lauber 2020   EYE SURGERY     KNEE SURGERY Left 2020   MENISCUS REPAIR   REPAIR FASCIAL DEFECT LEG  1972   SEPTOPLASTY     THIGH FASCIOTOMY      Social History   Socioeconomic History   Marital status: Married    Spouse name: Charles   Number of children: 0   Years of education: Master's    Highest education level: Not on file  Occupational History   Not on file  Tobacco Use    Smoking status: Never   Smokeless tobacco: Never  Vaping Use   Vaping Use: Never used  Substance and Sexual Activity   Alcohol use: Yes    Comment: Wine couple days weekly   Drug use: No   Sexual activity: Yes    Partners: Male    Comment: 1ST INTERCOURSE- ?   PARTNERS- 3  Other Topics Concern   Not on file  Social History Narrative   Patient lives at home with her husband [name].    Patient has a Master's Degree and post grad.    Patient has no children.    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   Social Determinants of Health   Financial Resource Strain: Not on file  Food Insecurity: Not on file  Transportation Needs: Not on file  Physical Activity: Not on file  Stress: Not on file  Social Connections: Not on file    Family History  Problem Relation Age of Onset   Dementia Mother    Cancer Father    Asthma Sister    Breast cancer Sister    Stroke Maternal Grandmother        in 66s   Stroke Paternal Grandmother 42   Cancer Maternal Grandfather    Hypertension Neg Hx    Heart disease Neg Hx     Review of Systems  Constitutional:  Negative for fever.  Respiratory:  Negative for cough, shortness of breath and wheezing.   Cardiovascular:  Negative for chest pain, palpitations and leg swelling.  Gastrointestinal:        No gerd  Neurological:  Positive for dizziness (occ) and headaches. Negative for light-headedness.      Objective:   Vitals:   08/04/21 1002  BP: 116/68  Pulse: 62  Temp: 98.5 F (36.9 C)  SpO2: 97%   BP Readings from Last 3 Encounters:  08/04/21 116/68  01/21/21 120/68  10/03/20 125/73   Wt Readings from Last 3 Encounters:  08/04/21 187 lb (84.8 kg)  01/21/21 187 lb (84.8 kg)  10/03/20 189 lb 8 oz (86 kg)   Body mass index is 29.29 kg/m.   Physical Exam    Constitutional: Appears well-developed and well-nourished. No distress.  HENT:  Head: Normocephalic and atraumatic.  Neck: Neck supple. No tracheal  deviation present. No thyromegaly present.  No cervical lymphadenopathy Cardiovascular: Normal rate, regular rhythm and normal heart sounds.   No murmur heard. No carotid bruit .  No edema Pulmonary/Chest: Effort normal and breath sounds normal. No respiratory distress. No has no wheezes. No rales.  Skin: Skin is warm and dry. Not diaphoretic.  Psychiatric: Normal mood and affect. Behavior is normal.      Assessment &  Plan:    See Problem List for Assessment and Plan of chronic medical problems.

## 2021-08-03 NOTE — Patient Instructions (Addendum)
  Blood work was ordered.     Medications changes include :  none    A referral was ordered for Dr Tat.       Someone from their office will call you to schedule an appointment.     Please followup in 6 months

## 2021-08-04 ENCOUNTER — Ambulatory Visit: Payer: Medicare Other | Admitting: Internal Medicine

## 2021-08-04 ENCOUNTER — Other Ambulatory Visit: Payer: Self-pay

## 2021-08-04 ENCOUNTER — Encounter: Payer: Self-pay | Admitting: Internal Medicine

## 2021-08-04 VITALS — BP 116/68 | HR 62 | Temp 98.5°F | Ht 67.0 in | Wt 187.0 lb

## 2021-08-04 DIAGNOSIS — G250 Essential tremor: Secondary | ICD-10-CM

## 2021-08-04 DIAGNOSIS — N1831 Chronic kidney disease, stage 3a: Secondary | ICD-10-CM | POA: Diagnosis not present

## 2021-08-04 DIAGNOSIS — E7849 Other hyperlipidemia: Secondary | ICD-10-CM

## 2021-08-04 DIAGNOSIS — E038 Other specified hypothyroidism: Secondary | ICD-10-CM

## 2021-08-04 DIAGNOSIS — K219 Gastro-esophageal reflux disease without esophagitis: Secondary | ICD-10-CM | POA: Diagnosis not present

## 2021-08-04 DIAGNOSIS — R7303 Prediabetes: Secondary | ICD-10-CM

## 2021-08-04 DIAGNOSIS — J45909 Unspecified asthma, uncomplicated: Secondary | ICD-10-CM | POA: Diagnosis not present

## 2021-08-04 NOTE — Assessment & Plan Note (Signed)
Chronic  Clinically euthyroid Currently taking levothyroxine 50 mcg daily Check tsh  Titrate med dose if needed  

## 2021-08-04 NOTE — Assessment & Plan Note (Signed)
Chronic Regular exercise and healthy diet encouraged Check lipid panel  Continue pravastatin 40 mg daily 

## 2021-08-04 NOTE — Assessment & Plan Note (Signed)
Chronic Check a1c Low sugar / carb diet Stressed regular exercise  

## 2021-08-04 NOTE — Assessment & Plan Note (Signed)
Chronic Minimal decrease in GFR on a couple of occasions, which could have been related to mild dehydration She will get her blood work done in the next couple of days and we will make sure she is well-hydrated when she has that done Blood pressure well controlled Not taking NSAIDs on a regular basis Typically drinks a good amount of water

## 2021-08-04 NOTE — Assessment & Plan Note (Signed)
Chronic Would like to avoid medication ideally Would like to see a different neurologist to get their opinion on medication and if there is anything natural that would help

## 2021-08-04 NOTE — Assessment & Plan Note (Addendum)
Chronic Asthma is controlled Continue Symbicort inhaler twice daily and albuterol as needed Continue Singulair 10 mg nightly Continue regular exercise-has seen overall improved lung function with regular exercise.  Not overly concerned about not being able to take a deep breath at this time-we both agreed no further evaluation is needed at this time

## 2021-08-04 NOTE — Assessment & Plan Note (Signed)
Chronic GERD controlled Continue famotidine 40 mg daily 

## 2021-08-05 LAB — CBC WITH DIFFERENTIAL/PLATELET
Basophils Absolute: 0.1 10*3/uL (ref 0.0–0.1)
Basophils Relative: 1.1 % (ref 0.0–3.0)
Eosinophils Absolute: 0.1 10*3/uL (ref 0.0–0.7)
Eosinophils Relative: 1.2 % (ref 0.0–5.0)
HCT: 39.3 % (ref 36.0–46.0)
Hemoglobin: 12.9 g/dL (ref 12.0–15.0)
Lymphocytes Relative: 31.2 % (ref 12.0–46.0)
Lymphs Abs: 1.7 10*3/uL (ref 0.7–4.0)
MCHC: 32.9 g/dL (ref 30.0–36.0)
MCV: 89.5 fl (ref 78.0–100.0)
Monocytes Absolute: 0.4 10*3/uL (ref 0.1–1.0)
Monocytes Relative: 7.7 % (ref 3.0–12.0)
Neutro Abs: 3.3 10*3/uL (ref 1.4–7.7)
Neutrophils Relative %: 58.8 % (ref 43.0–77.0)
Platelets: 242 10*3/uL (ref 150.0–400.0)
RBC: 4.39 Mil/uL (ref 3.87–5.11)
RDW: 14.1 % (ref 11.5–15.5)
WBC: 5.5 10*3/uL (ref 4.0–10.5)

## 2021-08-05 LAB — COMPREHENSIVE METABOLIC PANEL
ALT: 15 U/L (ref 0–35)
AST: 18 U/L (ref 0–37)
Albumin: 4.2 g/dL (ref 3.5–5.2)
Alkaline Phosphatase: 46 U/L (ref 39–117)
BUN: 17 mg/dL (ref 6–23)
CO2: 25 mEq/L (ref 19–32)
Calcium: 9.4 mg/dL (ref 8.4–10.5)
Chloride: 105 mEq/L (ref 96–112)
Creatinine, Ser: 0.98 mg/dL (ref 0.40–1.20)
GFR: 55.41 mL/min — ABNORMAL LOW (ref >60.00)
Glucose, Bld: 81 mg/dL (ref 70–99)
Potassium: 4.6 mEq/L (ref 3.5–5.1)
Sodium: 137 mEq/L (ref 135–145)
Total Bilirubin: 0.7 mg/dL (ref 0.2–1.2)
Total Protein: 6.6 g/dL (ref 6.0–8.3)

## 2021-08-05 LAB — LIPID PANEL
Cholesterol: 151 mg/dL (ref 0–200)
HDL: 56.8 mg/dL (ref >39.00)
LDL Cholesterol: 72 mg/dL (ref 0–99)
NonHDL: 93.88
Total CHOL/HDL Ratio: 3
Triglycerides: 110 mg/dL (ref 0.0–149.0)
VLDL: 22 mg/dL (ref 0.0–40.0)

## 2021-08-05 LAB — TSH: TSH: 2.91 u[IU]/mL (ref 0.35–5.50)

## 2021-08-05 LAB — HEMOGLOBIN A1C: Hgb A1c MFr Bld: 5.8 % (ref 4.6–6.5)

## 2021-08-05 NOTE — Addendum Note (Signed)
Addended by: Boris Lown B on: 08/05/2021 12:33 PM   Modules accepted: Orders

## 2021-09-03 ENCOUNTER — Ambulatory Visit (INDEPENDENT_AMBULATORY_CARE_PROVIDER_SITE_OTHER): Payer: Medicare Other | Admitting: Obstetrics & Gynecology

## 2021-09-03 ENCOUNTER — Other Ambulatory Visit: Payer: Self-pay

## 2021-09-03 ENCOUNTER — Encounter: Payer: Self-pay | Admitting: Obstetrics & Gynecology

## 2021-09-03 VITALS — BP 120/70 | HR 83 | Resp 16 | Ht 65.75 in | Wt 191.0 lb

## 2021-09-03 DIAGNOSIS — Z90710 Acquired absence of both cervix and uterus: Secondary | ICD-10-CM

## 2021-09-03 DIAGNOSIS — Z4689 Encounter for fitting and adjustment of other specified devices: Secondary | ICD-10-CM

## 2021-09-03 DIAGNOSIS — Z780 Asymptomatic menopausal state: Secondary | ICD-10-CM

## 2021-09-03 DIAGNOSIS — Z01419 Encounter for gynecological examination (general) (routine) without abnormal findings: Secondary | ICD-10-CM | POA: Diagnosis not present

## 2021-09-03 NOTE — Progress Notes (Signed)
Melissa Lowe September 27, 1941 [id number]   History:    80 y.o.  G0 Married.  At [facility] since 12/2018.  [place]   RP:  Established patient presenting for annual gyn exam    HPI: Postmenopause, well on no HRT.  S/P TAH for Fibroids.  No pelvic pain.  Well with pessary Milex ring #1 with support.  No bleeding or discharge.  Abstinent.  Last Pap Neg 2018. Urine and bowel movements normal.  Breasts normal.  Screening mammo Neg 06/2021.  Body mass index 31.06.  Walking.  Health labs with family physician. Colonoscopy 2020.  BD normal in 2018.   Past medical history,surgical history, family history and social history were all reviewed and documented in the EPIC chart.  Gynecologic History No LMP recorded. Patient is postmenopausal.  S/P Hysterectomy.  Obstetric History OB History  Gravida Para Term Preterm AB Living  0 0 0 0 0 0  SAB IAB Ectopic Multiple Live Births  0 0 0 0 0     ROS: A ROS was performed and pertinent positives and negatives are included in the history.  GENERAL: No fevers or chills. HEENT: No change in vision, no earache, sore throat or sinus congestion. NECK: No pain or stiffness. CARDIOVASCULAR: No chest pain or pressure. No palpitations. PULMONARY: No shortness of breath, cough or wheeze. GASTROINTESTINAL: No abdominal pain, nausea, vomiting or diarrhea, melena or bright red blood per rectum. GENITOURINARY: No urinary frequency, urgency, hesitancy or dysuria. MUSCULOSKELETAL: No joint or muscle pain, no back pain, no recent trauma. DERMATOLOGIC: No rash, no itching, no lesions. ENDOCRINE: No polyuria, polydipsia, no heat or cold intolerance. No recent change in weight. HEMATOLOGICAL: No anemia or easy bruising or bleeding. NEUROLOGIC: No headache, seizures, numbness, tingling or weakness. PSYCHIATRIC: No depression, no loss of interest in normal activity or change in sleep pattern.     Exam:   BP 120/70   Pulse 83   Resp 16   Ht 5' 5.75" (1.67 m)   Wt  191 lb (86.6 kg)   BMI 31.06 kg/m   Body mass index is 31.06 kg/m.  General appearance : Well developed well nourished female. No acute distress HEENT: Eyes: no retinal hemorrhage or exudates,  Neck supple, trachea midline, no carotid bruits, no thyroidmegaly Lungs: Clear to auscultation, no rhonchi or wheezes, or rib retractions  Heart: Regular rate and rhythm, no murmurs or gallops Breast:Examined in sitting and supine position were symmetrical in appearance, no palpable masses or tenderness,  no skin retraction, no nipple inversion, no nipple discharge, no skin discoloration, no axillary or supraclavicular lymphadenopathy Abdomen: no palpable masses or tenderness, no rebound or guarding Extremities: no edema or skin discoloration or tenderness  Pelvic: Vulva: Normal             Vagina: No gross lesions or discharge  Cervix/Uterus absent.  Pessary removed, cleaned and reinserted.    Adnexa  Without masses or tenderness  Anus: Normal   Assessment/Plan:  80 y.o. female for annual exam   1. Well female exam with routine gynecological exam  Postmenopause, well on no HRT.  S/P TAH for Fibroids.  No pelvic pain.  Well with pessary Milex ring #1 with support.  No bleeding or discharge.  Abstinent.  Last Pap Neg 2018. Urine and bowel movements normal.  Breasts normal.  Screening mammo Neg 06/2021.  Body mass index 31.06.  Walking.  Health labs with family physician. Colonoscopy 2020.  BD normal in 2018.  2. Hx of total hysterectomy  3. Postmenopause Well on no HRT. BD Normal in 2018, will repeat at next Annual Gyn visit.  4. Pessary maintenance Pessary is a very good fit.  No Cx.  F/U 6 months for pessary maintenance. Other orders - VITAMIN D PO; Take by mouth. - Ascorbic Acid (VITAMIN C PO); Take by mouth.   Princess Bruins MD, 2:19 PM 09/03/2021

## 2021-09-05 ENCOUNTER — Telehealth: Payer: Self-pay | Admitting: Internal Medicine

## 2021-09-05 ENCOUNTER — Ambulatory Visit: Payer: Medicare Other | Attending: Internal Medicine

## 2021-09-05 ENCOUNTER — Other Ambulatory Visit: Payer: Self-pay

## 2021-09-05 ENCOUNTER — Other Ambulatory Visit (HOSPITAL_BASED_OUTPATIENT_CLINIC_OR_DEPARTMENT_OTHER): Payer: Self-pay

## 2021-09-05 DIAGNOSIS — G250 Essential tremor: Secondary | ICD-10-CM

## 2021-09-05 DIAGNOSIS — Z23 Encounter for immunization: Secondary | ICD-10-CM

## 2021-09-05 MED ORDER — PFIZER COVID-19 VAC BIVALENT 30 MCG/0.3ML IM SUSP
INTRAMUSCULAR | 0 refills | Status: DC
Start: 2021-09-05 — End: 2021-12-14
  Filled 2021-09-05: qty 0.3, 1d supply, fill #0

## 2021-09-05 NOTE — Telephone Encounter (Signed)
Pt. Has called and is requesting referral be sent to Dr. Wells Guiles Tat at [facility].    Please advise.   Callback #- [phone]

## 2021-09-05 NOTE — Progress Notes (Signed)
   Covid-19 Vaccination Clinic  Name:  Melissa Lowe    MRN: [id number] DOB: 04/12/1941  09/05/2021  Melissa Lowe was observed post Covid-19 immunization for 15 minutes without incident. She was provided with Vaccine Information Sheet and instruction to access the V-Safe system.   Melissa Lowe was instructed to call 911 with any severe reactions post vaccine: Difficulty breathing  Swelling of face and throat  A fast heartbeat  A bad rash all over body  Dizziness and weakness   Immunizations Administered     Name Date Dose VIS Date Route   Pfizer Covid-19 Vaccine Bivalent Booster 09/05/2021  3:08 PM 0.3 mL 06/25/2021 Intramuscular   Manufacturer: [place]   Lot: [id number]   [place]: [phone]

## 2021-09-07 NOTE — Telephone Encounter (Signed)
Melissa Lowe - I had referred her recently to Dr Tat - she was going to [facility] but would like to establish with Dr Tat. I think they did not want to see her since she was already going to [place].    Do I need to enter another referral?

## 2021-09-08 ENCOUNTER — Encounter: Payer: Self-pay | Admitting: Neurology

## 2021-09-08 NOTE — Telephone Encounter (Signed)
New referral ordered

## 2021-09-15 NOTE — Progress Notes (Signed)
Assessment/Plan:   1.  Essential Tremor.  -This is evidenced by the symmetrical nature and longstanding hx of gradually getting worse.  We discussed nature and pathophysiology.  We discussed that this can continue to gradually get worse with time.  We discussed that some medications can worsen this, as can caffeine use.  We discussed medication therapy as well as surgical therapy.  We discussed nonmedicinal therapies such as weighted gloves, weighted spoons, weighted forks, [employer], readi steadi.  Patient literature was given on these things.  Ultimately, the patient decided to hold off on any treatments for now.    -Patient was given primidone in the past but worried about side effects that she read on the Internet, including vertigo.  Discussed with her that it is fine that she does not want to take medication right now, but would not read about side effects on the Internet related to primidone, because those were typically much higher dosages, given the seizure patients.  -we discussed DBS and focused ultrasound.   2.  Bppv  -Intermittent.  Is getting ready to see ENT.  Currently asymptomatic.  Often resolved with epley at home per pt  Subjective:   Melissa Lowe was seen in consultation in the movement disorder clinic at the request of Binnie Rail, MD.  The evaluation is for tremor.  This patient is accompanied in the office by her spouse who supplements the history.Pt previously seen at [facility].  Those records are reviewed.  She has been seen by several [facility] providers, including Dr. Erling Cruz, Dr. Janann Colonel and most recently Dr. Rexene Alberts.  Tremor started many years ago, in approximately 2010.  It involved left greater than right hand (that is still the case, but she is R hand dominant).  When she saw Dr. Janann Colonel all the way back in 2014, she had developed a rest component to the tremor.  He stated "she has symptoms and findings consistent with both essential tremor and parkinsonism, at this time feel she  is still more likely an essential tremor."  She was on no medication at that time.  Dr. Rexene Alberts took over her care in 2016 and she was monitored on a yearly basis, without need for medication.  She was last seen in December, 2021.  She was offered primidone in the past but felt that SE were not worth the benefit.  Affected by caffeine:  unsure , no longer drinks much (used to do tea/hot cocoa).  Still eats chocolate but that is only real caffeine Affected by alcohol:  doesn't think so (has 1 glass of wine/week) Affected by stress:  Yes.   Affected by fatigue:  No., but states that she is not usually fatigued Spills soup if on spoon:  No. (Tremor is worse with plastic ware) Spills glass of liquid if full:  No. Affects ADL's (tying shoes, brushing teeth, etc):  No.  Current/Previously tried tremor medications: none  Current medications that may exacerbate tremor: albuterol (uses very rarely - maybe yearly); singulair (pt thinks that this doesn't change tremor)  Outside reports reviewed: historical medical records, office notes, and referral letter/letters.  Allergies  Allergen Reactions   Sulfonamide Derivatives     Rash on inside of arms Because of a history of documented adverse serious drug reaction;Medi Alert bracelet  is recommended   Prednisone Other (See Comments)    ? allergy   Tetracycline     Severe itching on lower leg    Current Outpatient Medications  Medication Instructions  albuterol (PROAIR HFA) 108 (90 Base) MCG/ACT inhaler 1 puff, Inhalation, Every 6 hours PRN   Ascorbic Acid (VITAMIN C PO) Oral   bimatoprost (LATISSE) 0.03 % ophthalmic solution Both Eyes, apply evenly along the skin of the upper eyelid at base of eyelashes 3xs weekly at bedtime; repeat procedure for second eye (use a clean applicator).   budesonide-formoterol (SYMBICORT) 80-4.5 MCG/ACT inhaler USE 1 INHALATION BY MOUTH  DAILY PER THE MANUFACTURER,  EXPIRES 3 MONTHS AFTER  OPENING OVERWRAP   COVID-19  mRNA bivalent vaccine, Pfizer, (PFIZER COVID-19 VAC BIVALENT) injection Intramuscular   ELDERBERRY PO Oral, [place] Liquid.   famotidine (PEPCID) 40 mg, Oral, Daily   levothyroxine (SYNTHROID) 50 MCG tablet TAKE 1 TABLET BY MOUTH IN  THE MORNING   montelukast (SINGULAIR) 10 MG tablet TAKE 1 TABLET BY MOUTH  DAILY   pravastatin (PRAVACHOL) 40 MG tablet TAKE 1 TABLET BY MOUTH AT  BEDTIME   VITAMIN D PO Oral     Objective:   VITALS:   Vitals:   09/23/21 0931  BP: 121/74  Pulse: 71  SpO2: 96%  Weight: 187 lb 9.6 oz (85.1 kg)  Height: 5\' 6"  (1.676 m)   Gen:  Appears stated age and in NAD. HEENT:  Normocephalic, atraumatic. The mucous membranes are moist. The superficial temporal arteries are without ropiness or tenderness. Cardiovascular: Regular rate and rhythm. Lungs: Clear to auscultation bilaterally. Neck: There are no carotid bruits noted bilaterally.  NEUROLOGICAL:  Orientation:  The patient is alert and oriented x 3.   Cranial nerves: There is good facial symmetry. Extraocular muscles are intact and visual fields are full to confrontational testing. Speech is fluent and clear. Soft palate rises symmetrically and there is no tongue deviation. Hearing is intact to conversational tone. Tone: Tone is good throughout. Sensation: Sensation is intact to light touch touch throughout (facial, trunk, extremities). Vibration is intact at the bilateral big toe. There is no extinction with double simultaneous stimulation. There is no sensory dermatomal level identified. Coordination:  The patient has no dysdiadichokinesia or dysmetria. Motor: Strength is 5/5 in the bilateral upper and lower extremities.  Shoulder shrug is equal bilaterally.  There is no pronator drift.  There are no fasciculations noted. DTR's: Deep tendon reflexes are 2/4 at the bilateral biceps, triceps, brachioradialis, 3/4 at the bilateral patella and achilles.  Plantar responses are downgoing bilaterally. Gait and  Station: The patient is able to ambulate without difficulty. The patient is able to heel toe walk without any difficulty. The patient is able to ambulate in a tandem fashion. The patient is able to stand in the Romberg position.   MOVEMENT EXAM: Tremor:  There is rest tremor independently in both hands.  There is some head tremor in the yes direction.  There is some chin tremor.  There is intention tremor, mod on the L, and more mild-mod on the right.  She has trouble getting the pen on the paper on the L to draw Archimedes spirals.  Once on the paper, tremor is evident, but spirals are done fairly well.  She spills just a little bit of water when pouring water from 1 glass to another.  The left hand is worse than the right when doing this.   I have reviewed and interpreted the following labs independently   Chemistry      Component Value Date/Time   NA 137 08/05/2021 1233   K 4.6 08/05/2021 1233   CL 105 08/05/2021 1233   CO2 25 08/05/2021 1233  BUN 17 08/05/2021 1233   CREATININE 0.98 08/05/2021 1233   CREATININE 0.97 (H) 07/09/2020 1052      Component Value Date/Time   CALCIUM 9.4 08/05/2021 1233   ALKPHOS 46 08/05/2021 1233   AST 18 08/05/2021 1233   ALT 15 08/05/2021 1233   BILITOT 0.7 08/05/2021 1233      Lab Results  Component Value Date   WBC 5.5 08/05/2021   HGB 12.9 08/05/2021   HCT 39.3 08/05/2021   MCV 89.5 08/05/2021   PLT 242.0 08/05/2021   Lab Results  Component Value Date   TSH 2.91 08/05/2021      Total time spent on today's visit was 53 minutes, including both face-to-face time and nonface-to-face time.  Time included that spent on review of records (prior notes available to me/labs/imaging if pertinent), discussing treatment and goals, answering patient's questions and coordinating care.  CC:  Binnie Rail, MD

## 2021-09-23 ENCOUNTER — Ambulatory Visit (INDEPENDENT_AMBULATORY_CARE_PROVIDER_SITE_OTHER): Payer: Medicare Other | Admitting: Neurology

## 2021-09-23 ENCOUNTER — Encounter: Payer: Self-pay | Admitting: Neurology

## 2021-09-23 ENCOUNTER — Other Ambulatory Visit: Payer: Self-pay

## 2021-09-23 VITALS — BP 121/74 | HR 71 | Ht 66.0 in | Wt 187.6 lb

## 2021-09-23 DIAGNOSIS — G250 Essential tremor: Secondary | ICD-10-CM | POA: Diagnosis not present

## 2021-09-23 DIAGNOSIS — H8110 Benign paroxysmal vertigo, unspecified ear: Secondary | ICD-10-CM | POA: Diagnosis not present

## 2021-09-23 NOTE — Patient Instructions (Signed)
Essential Tremor °A tremor is trembling or shaking that a person cannot control. Most tremors affect the hands or arms. Tremors can also affect the head, vocal cords, legs, and other parts of the body. Essential tremor is a tremor without a known cause. Usually, it occurs while a person is trying to perform an action. It tends to get worse gradually as a person ages. °What are the causes? °The cause of this condition is not known. °What increases the risk? °You are more likely to develop this condition if: °You have a family member with essential tremor. °You are age 40 or older. °You take certain medicines. °What are the signs or symptoms? °The main sign of a tremor is a rhythmic shaking of certain parts of your body that is uncontrolled and unintentional. You may: °Have difficulty eating with a spoon or fork. °Have difficulty writing. °Nod your head up and down or side to side. °Have a quivering voice. °The shaking may: °Get worse over time. °Come and go. °Be more noticeable on one side of your body. °Get worse due to stress, fatigue, caffeine, and extreme heat or cold. °How is this diagnosed? °This condition may be diagnosed based on: °Your symptoms and medical history. °A physical exam. °There is no single test to diagnose an essential tremor. However, your health care provider may order tests to rule out other causes of your condition. These may include: °Blood and urine tests. °Imaging studies of your brain, such as CT scan and MRI. °A test that measures involuntary muscle movement (electromyogram). °How is this treated? °Treatment for essential tremor depends on the severity of the condition. °Some tremors may go away without treatment. °Mild tremors may not need treatment if they do not affect your day-to-day life. °Severe tremors may need to be treated using one or more of the following options: °Medicines. °Lifestyle changes. °Occupational or physical therapy. °Follow these instructions at  home: °Lifestyle ° °Do not use any products that contain nicotine or tobacco, such as cigarettes and e-cigarettes. If you need help quitting, ask your health care provider. °Limit your caffeine intake as told by your health care provider. °Try to get 8 hours of sleep each night. °Find ways to manage your stress that fits your lifestyle and personality. Consider trying meditation or yoga. °Try to anticipate stressful situations and allow extra time to manage them. °If you are struggling emotionally with the effects of your tremor, consider working with a mental health provider. °General instructions °Take over-the-counter and prescription medicines only as told by your health care provider. °Avoid extreme heat and extreme cold. °Keep all follow-up visits as told by your health care provider. This is important. Visits may include physical therapy visits. °Contact a health care provider if: °You experience any changes in the location or intensity of your tremors. °You start having a tremor after starting a new medicine. °You have tremor with other symptoms, such as: °Numbness. °Tingling. °Pain. °Weakness. °Your tremor gets worse. °Your tremor interferes with your daily life. °You feel down, blue, or sad for at least 2 weeks in a row. °Worrying about your tremor and what other people think about you interferes with your everyday life functions, including relationships, work, or school. °Summary °Essential tremor is a tremor without a known cause. Usually, it occurs when you are trying to perform an action. °You are more likely to develop this condition if you have a family member with essential tremor. °The main sign of a tremor is a rhythmic shaking of   certain parts of your body that is uncontrolled and unintentional. °Treatment for essential tremor depends on the severity of the condition. °This information is not intended to replace advice given to you by your health care provider. Make sure you discuss any questions  you have with your health care provider. °Document Revised: 07/03/2020 Document Reviewed: 07/05/2020 °Elsevier Patient Education © 2022 [employer] ° °

## 2021-10-07 ENCOUNTER — Ambulatory Visit: Payer: Medicare Other | Admitting: Neurology

## 2021-10-18 IMAGING — MG MM DIGITAL SCREENING BILAT W/ TOMO AND CAD
8 series · 8 of 24 positions shown · non-contrast
Comparison: Previous exam(s).

CLINICAL DATA: Screening.

EXAM:
DIGITAL SCREENING BILATERAL MAMMOGRAM WITH TOMOSYNTHESIS AND CAD
TECHNIQUE: Bilateral screening digital craniocaudal and mediolateral oblique
mammograms were obtained. Bilateral screening digital breast
tomosynthesis was performed. The images were evaluated with
computer-aided detection.

[R CC synth-2D]
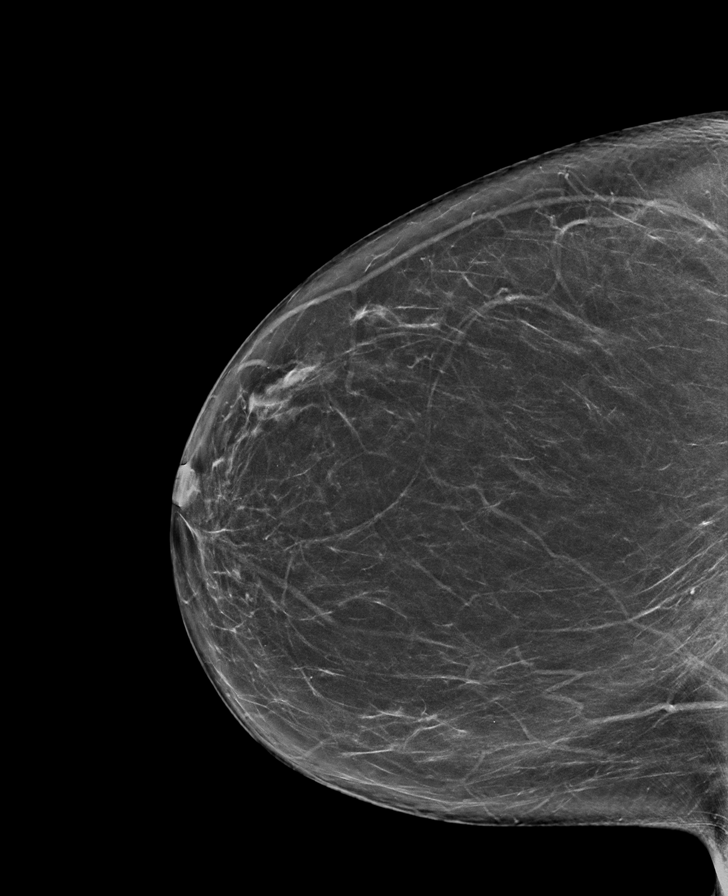

[L MLO synth-2D]
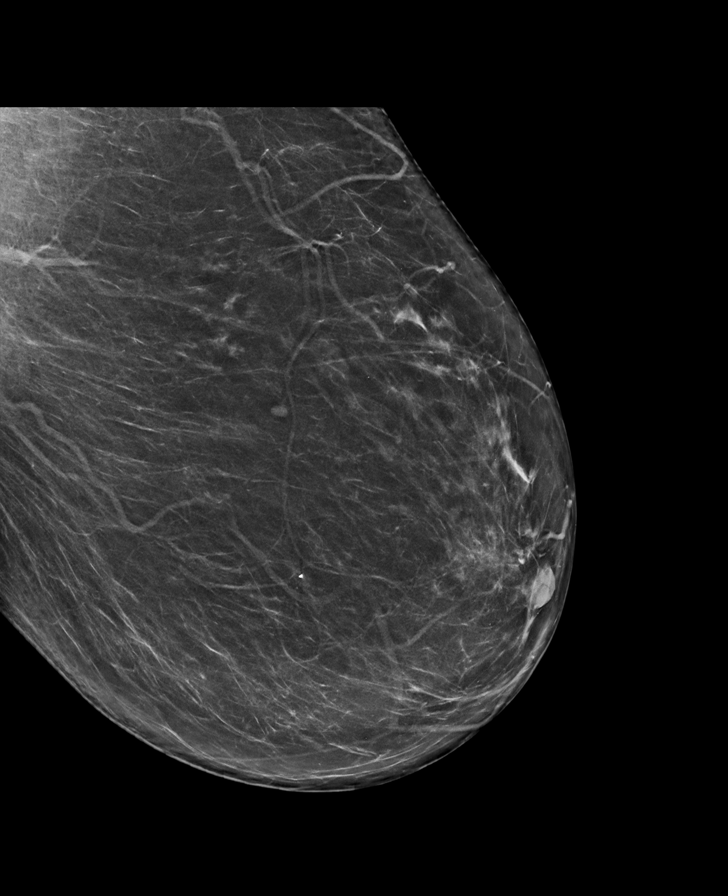

[L CC synth-2D]
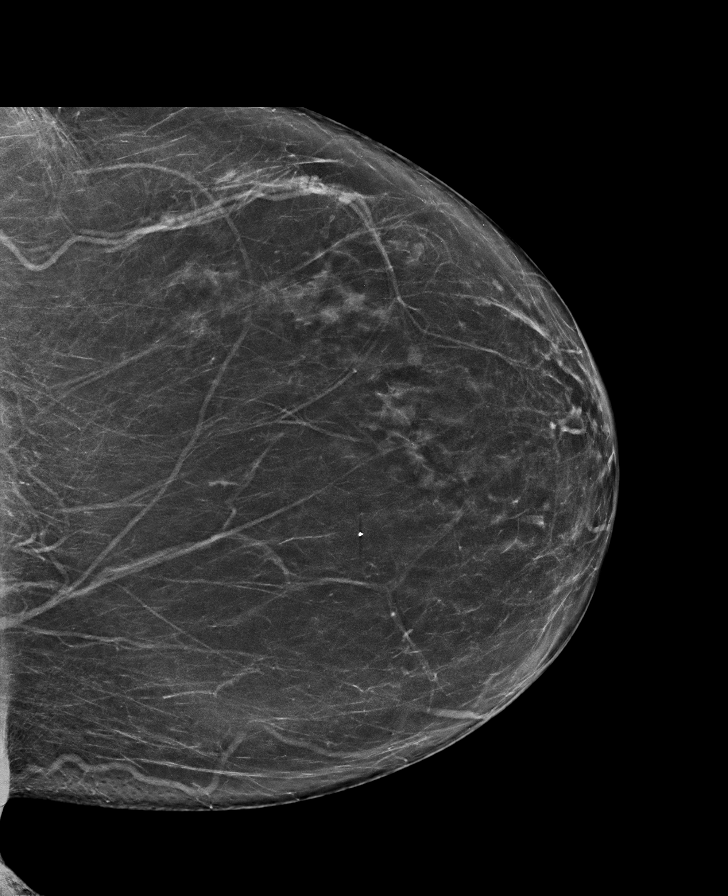

[R MLO synth-2D]
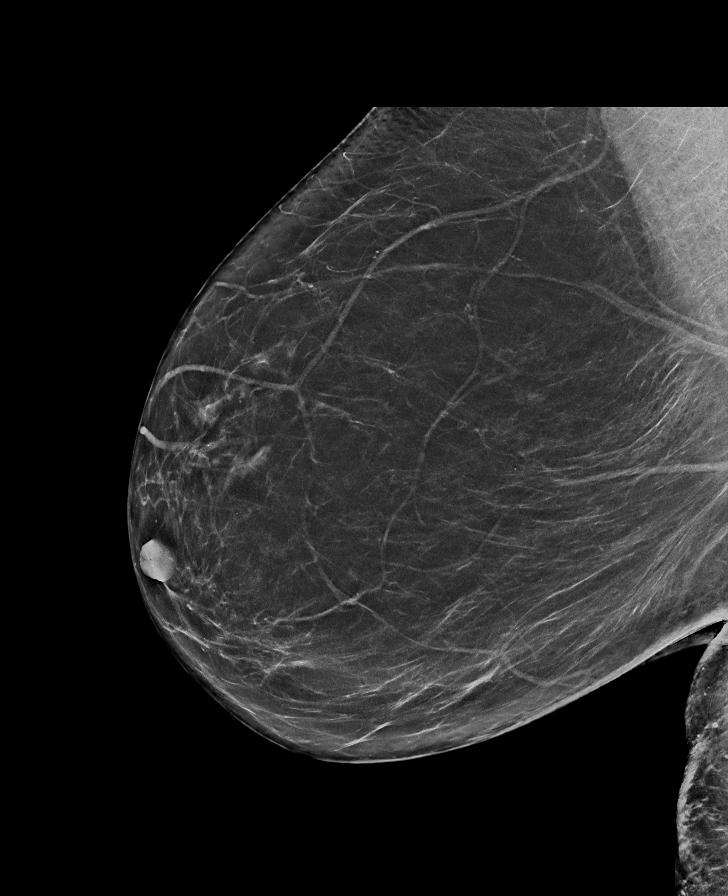

[R CC tomo · tomo slice 39/77.0]
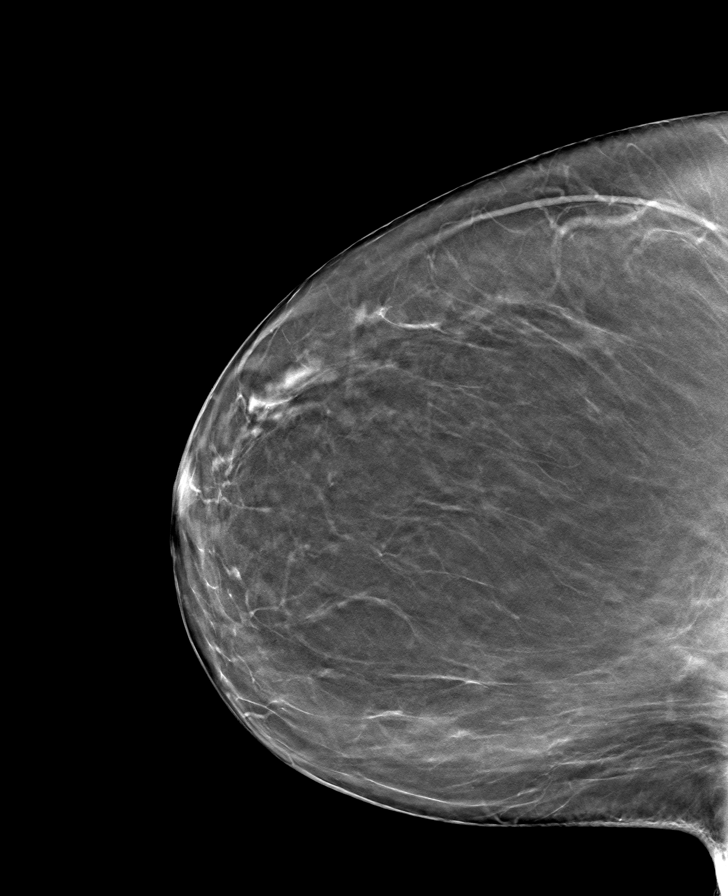

[L MLO tomo · tomo slice 40/79.0]
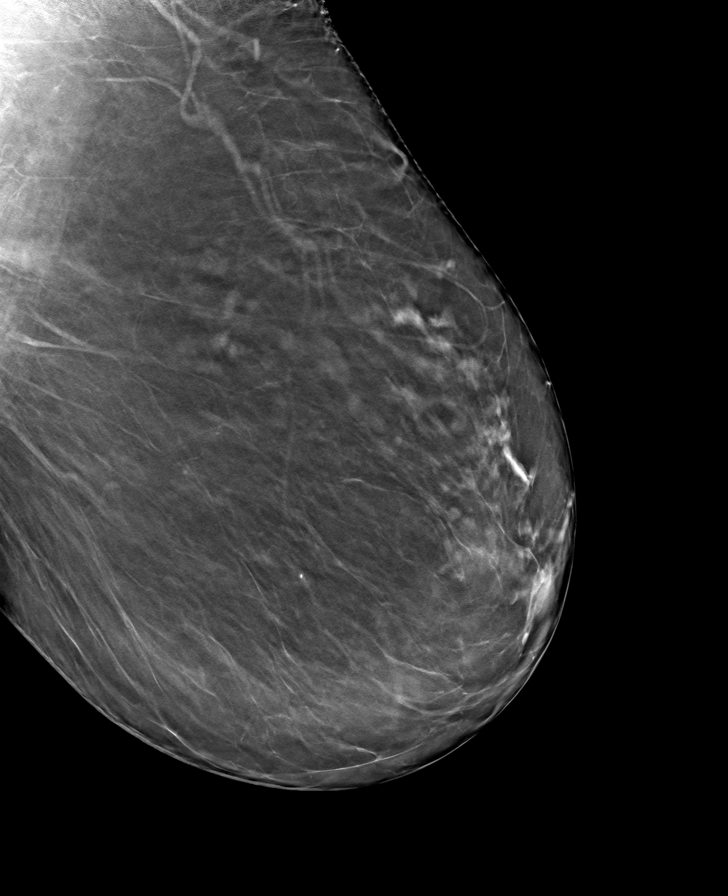

[R MLO tomo · tomo slice 41/80.0]
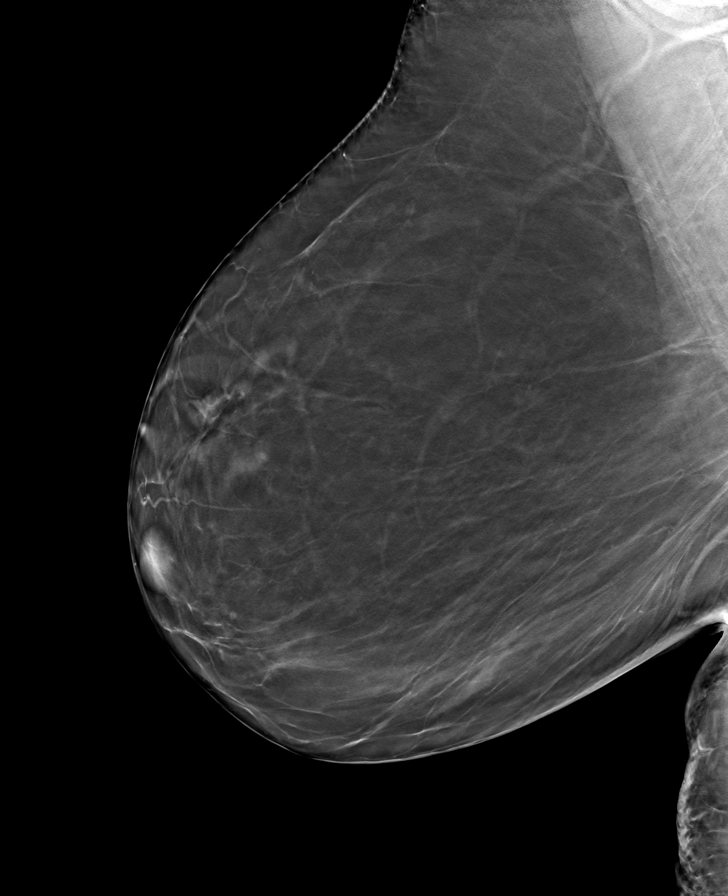

[L CC tomo · tomo slice 37/74.0]
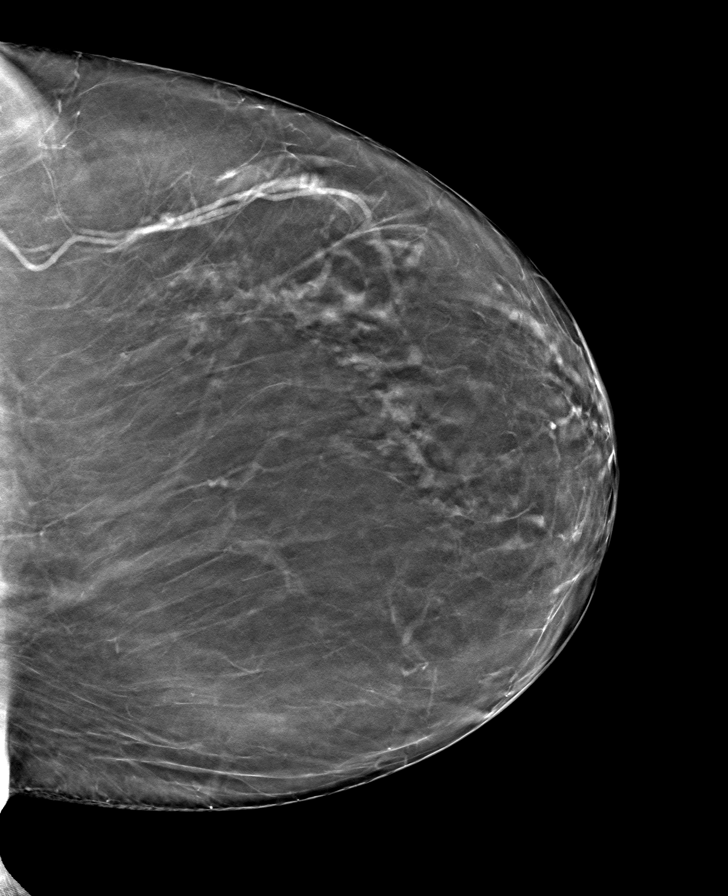

[8 of 24 positions shown; findings below may reference images not displayed]

ACR Breast Density Category b: There are scattered areas of
fibroglandular density.
FINDINGS: There are no findings suspicious for malignancy.
IMPRESSION: No mammographic evidence of malignancy. A result letter of this
screening mammogram will be mailed directly to the patient.

RECOMMENDATION:
Screening mammogram in one year. (Code:51-O-LD2)

BI-RADS CATEGORY  1: Negative.

## 2021-12-14 NOTE — Patient Instructions (Addendum)
° ° °  Increase the symbicort to 2 puffs twice daily - if this does not improve the wheezing or tightness let me know.   A zpak and steroid were sent to [facility].

## 2021-12-14 NOTE — Progress Notes (Signed)
Subjective:    Patient ID: Melissa Lowe, female    DOB: 1940/11/13, 81 y.o.   MRN: [id number]  This visit occurred during the SARS-CoV-2 public health emergency.  Safety protocols were in place, including screening questions prior to the visit, additional usage of staff PPE, and extensive cleaning of exam room while observing appropriate contact time as indicated for disinfecting solutions.     HPI The patient is here for an acute visit for which seems to have been flared up recently.     She has been wheezing and she has a lot of phlegm coming up.  She has tightness in her chest.  Last night she took one spray of the albuterol and that has helped.  She rarely has to take the albuterol.  She uses the Symbicort 1 puff once daily and that typically controls her asthma.   CKD - no tea or soda.  Urine is pale yellow.  She is concerned about why she has mild chronic kidney disease   Medications and allergies reviewed with patient and updated if appropriate.  Patient Active Problem List   Diagnosis Date Noted   CKD (chronic kidney disease) stage 3, GFR 30-59 ml/min ([place]) 08/03/2021   Cough 10/07/2020   Throat clearing 07/09/2020   Internal hemorrhoids 02/23/2018   Hypothyroidism 01/12/2017   GERD (gastroesophageal reflux disease) 01/12/2017   PAC (premature atrial contraction) 09/22/2016   Fatty liver, mild 05/19/2016   Prediabetes 05/14/2016   Benign essential tremor 05/14/2016   Cystocele with prolapse 05/14/2016   LIPOMA 03/10/2010   Tinnitus 03/10/2010   DIVERTICULOSIS, COLON 10/18/2008   Hyperlipidemia 08/08/2007   HIP PAIN 08/08/2007   Asthma without acute exacerbation 08/02/2007   Personal history of colonic polyps 08/02/2007    Current Outpatient Medications on File Prior to Visit  Medication Sig Dispense Refill   albuterol (VENTOLIN HFA) 108 (90 Base) MCG/ACT inhaler USE 1 INHALATION BY MOUTH  EVERY 6 HOURS AS NEEDED FOR WHEEZING OR SHORTNESS OF  BREATH 17 g  3   Ascorbic Acid (VITAMIN C PO) Take by mouth.     bimatoprost (LATISSE) 0.03 % ophthalmic solution Place into both eyes. apply evenly along the skin of the upper eyelid at base of eyelashes 3xs weekly at bedtime; repeat procedure for second eye (use a clean applicator).     budesonide-formoterol (SYMBICORT) 80-4.5 MCG/ACT inhaler USE 1 INHALATION BY MOUTH  DAILY PER THE MANUFACTURER,  EXPIRES 3 MONTHS AFTER  OPENING OVERWRAP 10.2 g 3   ELDERBERRY PO Take by mouth. Bucoli Liquid.     famotidine (PEPCID) 20 MG tablet Take 40 mg by mouth daily.      levothyroxine (SYNTHROID) 50 MCG tablet TAKE 1 TABLET BY MOUTH IN  THE MORNING 90 tablet 3   montelukast (SINGULAIR) 10 MG tablet TAKE 1 TABLET BY MOUTH  DAILY 90 tablet 3   pravastatin (PRAVACHOL) 40 MG tablet TAKE 1 TABLET BY MOUTH AT  BEDTIME 90 tablet 3   VITAMIN D PO Take by mouth.     No current facility-administered medications on file prior to visit.    Past Medical History:  Diagnosis Date   Allergy    Asthma    Diverticulosis    Fibroid    Hyperlipidemia    Hypothyroidism    IBS (irritable bowel syndrome)    PAC (premature atrial contraction) 09/22/2016   Vertigo     Past Surgical History:  Procedure Laterality Date   ABDOMINAL HYSTERECTOMY  2  fibroids   ANTERIOR AND POSTERIOR REPAIR N/A 01/30/2013   Procedure:  POSTERIOR REPAIR (RECTOCELE);  Surgeon: Princess Bruins, MD;  Location: [facility];  Service: Gynecology;  Laterality: N/A;   arthoscopy Left 08/2018   L knee repair torn meniscus   CATARACT EXTRACTION     CHOLECYSTECTOMY     colon polypectomy  2001   Dr Earlean Shawl   COLONOSCOPY  12/25/13   Tics , Dr Lajoyce Lauber 2020   EYE SURGERY     KNEE SURGERY Left 2020   MENISCUS REPAIR   REPAIR FASCIAL DEFECT LEG  1972   SEPTOPLASTY     THIGH FASCIOTOMY      Social History   Socioeconomic History   Marital status: Married    Spouse name: [name]   Number of children: 0   Years of education: Master's    Highest  education level: Not on file  Occupational History   Occupation: retired    Comment: IT work;  Tobacco Use   Smoking status: Never   Smokeless tobacco: Never  Vaping Use   Vaping Use: Never used  Substance and Sexual Activity   Alcohol use: Yes    Comment: once a week   Drug use: No   Sexual activity: Not Currently    Partners: Male    Birth control/protection: Post-menopausal    Comment: 1ST INTERCOURSE- ?   PARTNERS- 3  Other Topics Concern   Not on file  Social History Narrative   Patient lives at home with her husband [name].    Patient has a Master's Degree and post grad.    Patient has no children.    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   Social Determinants of Health   Financial Resource Strain: Not on file  Food Insecurity: Not on file  Transportation Needs: Not on file  Physical Activity: Not on file  Stress: Not on file  Social Connections: Not on file    Family History  Problem Relation Age of Onset   Dementia Mother    Cancer Father 93       throat CA; pneumonia   Asthma Sister    Breast cancer Sister    Stroke Maternal Grandmother        in 44s   Cancer Maternal Grandfather    Stroke Paternal Grandmother 89   Hypertension Neg Hx    Heart disease Neg Hx     Review of Systems  Constitutional:  Negative for fatigue and fever.  HENT:  Negative for congestion, ear pain, sinus pain and sore throat.   Respiratory:  Positive for cough (bringing up some phlegm), chest tightness and wheezing. Negative for shortness of breath.       Objective:   Vitals:   12/16/21 1300  BP: 118/68  Pulse: 65  Temp: 98.1 F (36.7 C)  SpO2: 95%   BP Readings from Last 3 Encounters:  12/16/21 118/68  09/23/21 121/74  09/03/21 120/70   Wt Readings from Last 3 Encounters:  12/16/21 187 lb 6.4 oz (85 kg)  09/23/21 187 lb 9.6 oz (85.1 kg)  09/03/21 191 lb (86.6 kg)   Body mass index is 30.25 kg/m.   Physical Exam    Constitutional: Appears  well-developed and well-nourished. No distress.  HENT:  Head: Normocephalic and atraumatic.  Neck: Neck supple. No tracheal deviation present. No thyromegaly present.  No cervical lymphadenopathy Cardiovascular: Normal rate, regular rhythm and normal heart sounds.   No edema Pulmonary/Chest: Effort  normal. No respiratory distress.  Has minimal tightness/wheeze on expiration. No rales.  Skin: Skin is warm and dry. Not diaphoretic.  Psychiatric: Normal mood and affect. Behavior is normal.      Assessment & Plan:    See Problem List for Assessment and Plan of chronic medical problems.

## 2021-12-15 ENCOUNTER — Other Ambulatory Visit: Payer: Self-pay | Admitting: Internal Medicine

## 2021-12-16 ENCOUNTER — Other Ambulatory Visit: Payer: Self-pay

## 2021-12-16 ENCOUNTER — Ambulatory Visit: Payer: Medicare Other | Admitting: Internal Medicine

## 2021-12-16 ENCOUNTER — Encounter: Payer: Self-pay | Admitting: Internal Medicine

## 2021-12-16 VITALS — BP 118/68 | HR 65 | Temp 98.1°F | Ht 66.0 in | Wt 187.4 lb

## 2021-12-16 DIAGNOSIS — E038 Other specified hypothyroidism: Secondary | ICD-10-CM

## 2021-12-16 DIAGNOSIS — K219 Gastro-esophageal reflux disease without esophagitis: Secondary | ICD-10-CM

## 2021-12-16 DIAGNOSIS — J45909 Unspecified asthma, uncomplicated: Secondary | ICD-10-CM

## 2021-12-16 DIAGNOSIS — E7849 Other hyperlipidemia: Secondary | ICD-10-CM | POA: Diagnosis not present

## 2021-12-16 DIAGNOSIS — R7303 Prediabetes: Secondary | ICD-10-CM | POA: Diagnosis not present

## 2021-12-16 DIAGNOSIS — N1831 Chronic kidney disease, stage 3a: Secondary | ICD-10-CM

## 2021-12-16 MED ORDER — PREDNISONE 20 MG PO TABS: 40.0000 mg | ORAL_TABLET | Freq: Every day | ORAL | 0 refills | Status: AC

## 2021-12-16 MED ORDER — AZITHROMYCIN 250 MG PO TABS: ORAL_TABLET | ORAL | 0 refills | Status: DC

## 2021-12-16 NOTE — Assessment & Plan Note (Signed)
Acute Symptoms she is experiencing are consistent with asthma exacerbation Trigger-?  Allergies, viral URI or bacterial URI She will first try to increase the Symbicort from 1 puff daily in the morning to 2 puffs twice daily to see if that helps If no improvement she will start the Z-Pak If absolutely needed she will then take the prednisone, but hopefully we can avoid that She will update me if her symptoms or not improving or if she has any concerns

## 2022-01-22 ENCOUNTER — Ambulatory Visit: Payer: Medicare Other | Admitting: Internal Medicine

## 2022-01-27 ENCOUNTER — Ambulatory Visit: Payer: Medicare Other | Admitting: Internal Medicine

## 2022-02-04 ENCOUNTER — Encounter: Payer: Self-pay | Admitting: Internal Medicine

## 2022-02-04 NOTE — Progress Notes (Signed)
? ? ? ? ?Subjective:  ? ? Patient ID: Melissa Lowe, female    DOB: 05-Nov-1940, 81 y.o.   MRN: [id number] ? ?This visit occurred during the SARS-CoV-2 public health emergency.  Safety protocols were in place, including screening questions prior to the visit, additional usage of staff PPE, and extensive cleaning of exam room while observing appropriate contact time as indicated for disinfecting solutions.   ? ? ?HPI ?Melissa Lowe is here for follow up of her chronic medical problems, including HLD, prediabetes, hypothyroid, CKD, GERD, asthma ? ?Walking 3 times a week ? ?Cough x 10 days.  She was traveling and just after getting back started coughing - hard, heavy cough - episodic.  She is bringing up phlegm.  She states wheeze.   ? ?Something on toe - tried tinactin, polysporin.  It is raw and spreading around big toe.   ? ?Medications and allergies reviewed with patient and updated if appropriate. ? ?Current Outpatient Medications on File Prior to Visit  ?Medication Sig Dispense Refill  ? albuterol (VENTOLIN HFA) 108 (90 Base) MCG/ACT inhaler USE 1 INHALATION BY MOUTH  EVERY 6 HOURS AS NEEDED FOR WHEEZING OR SHORTNESS OF  BREATH 17 g 3  ? Ascorbic Acid (VITAMIN C PO) Take by mouth.    ? bimatoprost (LATISSE) 0.03 % ophthalmic solution Place into both eyes. apply evenly along the skin of the upper eyelid at base of eyelashes 3xs weekly at bedtime; repeat procedure for second eye (use a clean applicator).    ? budesonide-formoterol (SYMBICORT) 80-4.5 MCG/ACT inhaler USE 1 INHALATION BY MOUTH  DAILY PER THE MANUFACTURER,  EXPIRES 3 MONTHS AFTER  OPENING OVERWRAP 10.2 g 3  ? ELDERBERRY PO Take by mouth. Bucoli Liquid.    ? famotidine (PEPCID) 20 MG tablet Take 40 mg by mouth daily.     ? levothyroxine (SYNTHROID) 50 MCG tablet TAKE 1 TABLET BY MOUTH IN  THE MORNING 90 tablet 3  ? montelukast (SINGULAIR) 10 MG tablet TAKE 1 TABLET BY MOUTH  DAILY 90 tablet 3  ? pravastatin (PRAVACHOL) 40 MG tablet TAKE 1 TABLET BY MOUTH AT   BEDTIME 90 tablet 3  ? VITAMIN D PO Take by mouth.    ? ?No current facility-administered medications on file prior to visit.  ? ? ? ?Review of Systems  ?Constitutional:  Negative for chills and fever.  ?HENT:  Positive for rhinorrhea. Negative for congestion, sinus pain and sore throat.   ?Respiratory:  Positive for cough and wheezing. Negative for chest tightness and shortness of breath.   ?Cardiovascular:  Negative for chest pain, palpitations and leg swelling.  ?Neurological:  Negative for dizziness and headaches.  ? ?   ?Objective:  ? ?Vitals:  ? 02/05/22 0834  ?BP: 108/72  ?Pulse: 67  ?Temp: 98.2 ?F (36.8 ?C)  ?SpO2: 97%  ? ?BP Readings from Last 3 Encounters:  ?02/05/22 108/72  ?12/16/21 118/68  ?09/23/21 121/74  ? ?Wt Readings from Last 3 Encounters:  ?02/05/22 182 lb (82.6 kg)  ?12/16/21 187 lb 6.4 oz (85 kg)  ?09/23/21 187 lb 9.6 oz (85.1 kg)  ? ?Body mass index is 29.38 kg/m?. ? ?  ?Physical Exam ?Constitutional:   ?   General: She is not in acute distress. ?   Appearance: Normal appearance. She is not ill-appearing.  ?HENT:  ?   Head: Normocephalic and atraumatic.  ?   Right Ear: Tympanic membrane, ear canal and external ear normal.  ?   Left Ear: Tympanic membrane, ear  canal and external ear normal.  ?   Mouth/Throat:  ?   Mouth: Mucous membranes are moist.  ?   Pharynx: No oropharyngeal exudate or posterior oropharyngeal erythema.  ?Eyes:  ?   Conjunctiva/sclera: Conjunctivae normal.  ?Cardiovascular:  ?   Rate and Rhythm: Normal rate and regular rhythm.  ?Pulmonary:  ?   Effort: Pulmonary effort is normal. No respiratory distress.  ?   Breath sounds: Normal breath sounds. No wheezing or rales.  ?Musculoskeletal:  ?   Cervical back: Neck supple. No tenderness.  ?Lymphadenopathy:  ?   Cervical: No cervical adenopathy.  ?Skin: ?   General: Skin is warm and dry.  ?   Comments: Right big toe lateral aspect erythematous with some sloughing of the skin, nontender, non-itchy-looks like irritated skin, second  toe medial aspect slight bump that looks reactive to the first toe touching it.  No evidence of infection-likely just irritation from the toes rubbing  ?Neurological:  ?   Mental Status: She is alert.  ? ?   ? ?Lab Results  ?Component Value Date  ? WBC 5.5 08/05/2021  ? HGB 12.9 08/05/2021  ? HCT 39.3 08/05/2021  ? PLT 242.0 08/05/2021  ? GLUCOSE 81 08/05/2021  ? CHOL 151 08/05/2021  ? TRIG 110.0 08/05/2021  ? HDL 56.80 08/05/2021  ? LDLDIRECT 115.0 07/05/2019  ? [place] 72 08/05/2021  ? ALT 15 08/05/2021  ? AST 18 08/05/2021  ? NA 137 08/05/2021  ? K 4.6 08/05/2021  ? CL 105 08/05/2021  ? CREATININE 0.98 08/05/2021  ? BUN 17 08/05/2021  ? CO2 25 08/05/2021  ? TSH 2.91 08/05/2021  ? HGBA1C 5.8 08/05/2021  ? MICROALBUR 0.7 01/21/2021  ? ? ? ?Assessment & Plan:  ? ? ?See Problem List for Assessment and Plan of chronic medical problems.  ? ? ?

## 2022-02-04 NOTE — Patient Instructions (Addendum)
? ? ? ?  Blood work was ordered.   ? ? ? ?Medications changes include :   tussionex cough syrup ? ? ? ? ?Return in about 6 months (around 08/07/2022) for follow up. ? ?

## 2022-02-05 ENCOUNTER — Encounter: Payer: Self-pay | Admitting: Internal Medicine

## 2022-02-05 ENCOUNTER — Ambulatory Visit: Payer: Medicare Other | Admitting: Internal Medicine

## 2022-02-05 VITALS — BP 108/72 | HR 67 | Temp 98.2°F | Ht 66.0 in | Wt 182.0 lb

## 2022-02-05 DIAGNOSIS — E7849 Other hyperlipidemia: Secondary | ICD-10-CM

## 2022-02-05 DIAGNOSIS — N1831 Chronic kidney disease, stage 3a: Secondary | ICD-10-CM

## 2022-02-05 DIAGNOSIS — R7303 Prediabetes: Secondary | ICD-10-CM | POA: Diagnosis not present

## 2022-02-05 DIAGNOSIS — K219 Gastro-esophageal reflux disease without esophagitis: Secondary | ICD-10-CM

## 2022-02-05 DIAGNOSIS — J069 Acute upper respiratory infection, unspecified: Secondary | ICD-10-CM

## 2022-02-05 DIAGNOSIS — J45909 Unspecified asthma, uncomplicated: Secondary | ICD-10-CM

## 2022-02-05 DIAGNOSIS — E038 Other specified hypothyroidism: Secondary | ICD-10-CM

## 2022-02-05 DIAGNOSIS — G250 Essential tremor: Secondary | ICD-10-CM

## 2022-02-05 LAB — COMPREHENSIVE METABOLIC PANEL
ALT: 16 U/L (ref 0–35)
AST: 16 U/L (ref 0–37)
Albumin: 4.4 g/dL (ref 3.5–5.2)
Alkaline Phosphatase: 48 U/L (ref 39–117)
BUN: 18 mg/dL (ref 6–23)
CO2: 27 mEq/L (ref 19–32)
Calcium: 9.7 mg/dL (ref 8.4–10.5)
Chloride: 104 mEq/L (ref 96–112)
Creatinine, Ser: 0.93 mg/dL (ref 0.40–1.20)
GFR: 58.79 mL/min — ABNORMAL LOW (ref >60.00)
Glucose, Bld: 90 mg/dL (ref 70–99)
Potassium: 4.7 mEq/L (ref 3.5–5.1)
Sodium: 138 mEq/L (ref 135–145)
Total Bilirubin: 0.5 mg/dL (ref 0.2–1.2)
Total Protein: 6.9 g/dL (ref 6.0–8.3)

## 2022-02-05 LAB — LIPID PANEL
Cholesterol: 161 mg/dL (ref 0–200)
HDL: 59.8 mg/dL (ref >39.00)
LDL Cholesterol: 80 mg/dL (ref 0–99)
NonHDL: 101.36
Total CHOL/HDL Ratio: 3
Triglycerides: 107 mg/dL (ref 0.0–149.0)
VLDL: 21.4 mg/dL (ref 0.0–40.0)

## 2022-02-05 LAB — VITAMIN D 25 HYDROXY (VIT D DEFICIENCY, FRACTURES): VITD: 34.96 ng/mL (ref 30.00–100.00)

## 2022-02-05 LAB — TSH: TSH: 2.58 u[IU]/mL (ref 0.35–5.50)

## 2022-02-05 LAB — HEMOGLOBIN A1C: Hgb A1c MFr Bld: 5.9 % (ref 4.6–6.5)

## 2022-02-05 MED ORDER — HYDROCOD POLI-CHLORPHE POLI ER 10-8 MG/5ML PO SUER: 5.0000 mL | Freq: Two times a day (BID) | ORAL | 0 refills | Status: DC | PRN

## 2022-02-05 NOTE — Assessment & Plan Note (Addendum)
Acute ?Cough productive of sputum and intermittent wheeze ?Symptoms not improving with conservative measures ?Right ?Asthma is not exacerbated ?She will start her Z-Pak she has at home ?Continue inhalers, symptomatic treatment ?Tussionex cough syrup ?

## 2022-02-05 NOTE — Assessment & Plan Note (Signed)
Chronic  Clinically euthyroid Currently taking levothyroxine 50 mcg daily Check tsh  Titrate med dose if needed  

## 2022-02-05 NOTE — Assessment & Plan Note (Signed)
Chronic Regular exercise and healthy diet encouraged Check lipid panel  Continue pravastatin 40 mg daily 

## 2022-02-05 NOTE — Assessment & Plan Note (Signed)
Chronic GERD controlled Continue Pepcid 40 mg daily  

## 2022-02-05 NOTE — Assessment & Plan Note (Addendum)
Chronic ?Currently well controlled ?Continue Symbicort 80-4.5 mcg/ACT once daily ?Continue albuterol inhaler as needed ?Continue Singulair 10 mg nightly ?Has mild URI-continue Symbicort once daily since no exacerbation-can increase to twice daily if needed ?

## 2022-02-05 NOTE — Assessment & Plan Note (Signed)
Chronic ?Stable ?Would like to avoid medication ?Has seen neurology ?

## 2022-02-05 NOTE — Assessment & Plan Note (Signed)
Chronic Check a1c Low sugar / carb diet Continue regular exercise 

## 2022-02-05 NOTE — Assessment & Plan Note (Signed)
Chronic ?Mild ?Blood pressure well controlled ?No issues with sugars ?Does not take NSAIDs ?Possibly age-related nephrosclerosis ?Check renal ultrasound ?CMP ?

## 2022-02-24 ENCOUNTER — Encounter: Payer: Self-pay | Admitting: Internal Medicine

## 2022-02-24 ENCOUNTER — Telehealth: Payer: Self-pay | Admitting: Internal Medicine

## 2022-02-24 NOTE — Telephone Encounter (Signed)
Spoke with patient who did not want to discuss kidney function but wanted [place] to help with her another patient after already being told she was not on the [facility].  ?

## 2022-02-24 NOTE — Telephone Encounter (Signed)
We had discussed possible referral to nephrology-kidney specialist to give an answer to why her kidney function is slightly decreased.  See if this is what she wants and I will refer. ?

## 2022-02-24 NOTE — Telephone Encounter (Signed)
Patient would like nurse or Dr. Quay Burow to give her a call concerning kidney function that she had discussed previously. ?

## 2022-03-01 ENCOUNTER — Encounter: Payer: Self-pay | Admitting: Internal Medicine

## 2022-03-03 ENCOUNTER — Ambulatory Visit: Payer: Medicare Other | Admitting: Obstetrics & Gynecology

## 2022-03-06 ENCOUNTER — Ambulatory Visit: Payer: Medicare Other | Admitting: Obstetrics & Gynecology

## 2022-03-06 ENCOUNTER — Encounter: Payer: Self-pay | Admitting: Obstetrics & Gynecology

## 2022-03-06 VITALS — BP 118/64 | Ht 66.0 in

## 2022-03-06 DIAGNOSIS — Z4689 Encounter for fitting and adjustment of other specified devices: Secondary | ICD-10-CM

## 2022-03-06 DIAGNOSIS — Z90710 Acquired absence of both cervix and uterus: Secondary | ICD-10-CM

## 2022-03-06 NOTE — Progress Notes (Signed)
? ? ?  REGHAN THUL [birth year] [id number] ? ? ?     81 y.o.  G0 Married.  Building a new house at [employer]. ?  ?RP:  Pessary maintenance ?  ?HPI:  Well with pessary Milex ring #1 with support for a small Cystocele.  No pelvic pain. No discharge. Had 2 episodes of light bleeding mid March after a hiking trip and mid April.  No vaginal bleeding since then.  Abstinent. Postmenopause, well on no HRT.  S/P TAH for Fibroids.  ? ?OB History  ?Gravida Para Term Preterm AB Living  ?0 0 0 0 0 0  ?SAB IAB Ectopic Multiple Live Births  ?0 0 0 0 0  ? ? ?Past medical history,surgical history, problem list, medications, allergies, family history and social history were all reviewed and documented in the EPIC chart. ? ? ?Directed ROS with pertinent positives and negatives documented in the history of present illness/assessment and plan. ? ?Exam: ? ?Vitals:  ? 03/06/22 1014  ?BP: 118/64  ?Height: '5\' 6"'$  (1.676 m)  ? ?General appearance:  Normal ? ? ?Gynecologic exam: Vulva normal.  Pessary removed and cleaned.  No discharge or blood.  Speculum:  Vaginal mucosa with pessary pressure points showing mild erythema with easy bleeding.  No erosion or ulceration.  Pessary put back in place in the vagina. ? ? ?Assessment/Plan:  81 y.o. G0P0000  ? ?1. Pessary maintenance ?Well with pessary Milex ring #1 with support for a small Cystocele.  No pelvic pain. No discharge. Had 2 episodes of light bleeding mid March after a hiking trip and mid April.  No vaginal bleeding since then.  Abstinent. Postmenopause, well on no HRT.  S/P TAH for Fibroids. Vaginal mucosa with pessary pressure points showing mild erythema with easy bleeding.  No erosion or ulceration.  Pessary put back in place in the vagina.  Bleeding precautions discussed.  If recurrence of bleeding, will reexamine and decide if a break from the pessary is needed.  Patient voiced understanding and agreement. ? ? ?2. Hx of total hysterectomy  ? ?Princess Bruins MD, 10:31 AM  03/06/2022 ? ? ? ?  ?

## 2022-03-23 ENCOUNTER — Encounter: Payer: Self-pay | Admitting: Internal Medicine

## 2022-05-15 ENCOUNTER — Other Ambulatory Visit: Payer: Self-pay | Admitting: Obstetrics & Gynecology

## 2022-05-15 DIAGNOSIS — Z1231 Encounter for screening mammogram for malignant neoplasm of breast: Secondary | ICD-10-CM

## 2022-05-28 ENCOUNTER — Encounter: Payer: Self-pay | Admitting: Internal Medicine

## 2022-05-28 DIAGNOSIS — N1831 Chronic kidney disease, stage 3a: Secondary | ICD-10-CM

## 2022-06-08 DIAGNOSIS — M79641 Pain in right hand: Secondary | ICD-10-CM | POA: Insufficient documentation

## 2022-06-08 DIAGNOSIS — M189 Osteoarthritis of first carpometacarpal joint, unspecified: Secondary | ICD-10-CM | POA: Insufficient documentation

## 2022-06-08 DIAGNOSIS — M65311 Trigger thumb, right thumb: Secondary | ICD-10-CM | POA: Insufficient documentation

## 2022-06-08 DIAGNOSIS — B351 Tinea unguium: Secondary | ICD-10-CM | POA: Insufficient documentation

## 2022-06-26 ENCOUNTER — Encounter: Payer: Self-pay | Admitting: Internal Medicine

## 2022-07-01 ENCOUNTER — Ambulatory Visit
Admission: RE | Admit: 2022-07-01 | Discharge: 2022-07-01 | Disposition: A | Discharge status: Home / Self Care | Payer: Medicare Other | Source: Ambulatory Visit | Attending: Obstetrics & Gynecology | Admitting: Obstetrics & Gynecology

## 2022-07-01 DIAGNOSIS — Z1231 Encounter for screening mammogram for malignant neoplasm of breast: Secondary | ICD-10-CM

## 2022-08-05 ENCOUNTER — Encounter: Payer: Self-pay | Admitting: Internal Medicine

## 2022-08-05 NOTE — Patient Instructions (Addendum)
     Blood work was ordered.     Medications changes include :   cough syrup   Your prescription(s) have been sent to your pharmacy.     Return in about 6 months (around 02/05/2023) for Physical Exam.

## 2022-08-05 NOTE — Progress Notes (Signed)
Subjective:    Patient ID: Melissa Lowe, female    DOB: [birth year], 81 y.o.   MRN: [id number]     HPI Melissa Lowe is here for follow up of her chronic medical problems, including hld, prediabetes, hypothyroid, CKD, GERD, asthma  Cough x 3 weeks.  The cough is primarily dry, occasionally productive.  No major cold symptoms.  No wheezing or shortness of breath.  Does not feel like a typical cold or flare.  She is using her Symbicort once a day.  Has not used her albuterol inhaler because does not feel like asthma.  Has been avoiding more sugar  Saw Dr Patrice Paradise for her scoliosis -- degenerative disc dz  Bowel still not back to normal.  Soft  -stool is all different sizes.  She thinks it is gotten a little bit better, but it still not normal.  She knows it could be related to some of the things she is eating, but is concerned about the possibility of cancer.  Freq headaches - around top of head.  Has always had a lot of headaches, but it has been worse in the past 2-3 months - intermittent 6 days a week.  No nausea, changes in vision.  No h/o migraines.  There has been some increased stress which may be causing the increased headaches in the past 2-3 months.  Tylenol typically does not work.  She knows she needs to avoid NSAIDs.  She has tried a couple of aspirin in the past and she thinks that works.  Still has vertigo at times -can do the Epley maneuver and it helps   Medications and allergies reviewed with patient and updated if appropriate.  Current Outpatient Medications on File Prior to Visit  Medication Sig Dispense Refill   albuterol (VENTOLIN HFA) 108 (90 Base) MCG/ACT inhaler USE 1 INHALATION BY MOUTH  EVERY 6 HOURS AS NEEDED FOR WHEEZING OR SHORTNESS OF  BREATH 17 g 3   Ascorbic Acid (VITAMIN C PO) Take 500 mg by mouth daily.     bimatoprost (LATISSE) 0.03 % ophthalmic solution Place into both eyes. apply evenly along the skin of the upper eyelid at base of eyelashes 3xs  weekly at bedtime; repeat procedure for second eye (use a clean applicator).     budesonide-formoterol (SYMBICORT) 80-4.5 MCG/ACT inhaler USE 1 INHALATION BY MOUTH  DAILY PER THE MANUFACTURER,  EXPIRES 3 MONTHS AFTER  OPENING OVERWRAP 10.2 g 3   ELDERBERRY PO Take by mouth. Bucoli Liquid.     famotidine (PEPCID) 20 MG tablet Take 40 mg by mouth daily.      levothyroxine (SYNTHROID) 50 MCG tablet TAKE 1 TABLET BY MOUTH IN  THE MORNING 90 tablet 3   montelukast (SINGULAIR) 10 MG tablet TAKE 1 TABLET BY MOUTH  DAILY 90 tablet 3   pravastatin (PRAVACHOL) 40 MG tablet TAKE 1 TABLET BY MOUTH AT  BEDTIME 90 tablet 3   VITAMIN D PO Take 1,000 mg by mouth daily.     No current facility-administered medications on file prior to visit.     Review of Systems  Constitutional:  Negative for fever.  HENT:  Negative for congestion, sinus pain and sore throat.   Respiratory:  Positive for cough (sometimes productive, mostly dry). Negative for chest tightness, shortness of breath and wheezing.   Cardiovascular:  Negative for chest pain, palpitations and leg swelling.  Gastrointestinal:        No gerd  Neurological:  Positive for dizziness (occ)  and headaches. Negative for light-headedness.       Objective:   Vitals:   08/06/22 0855  BP: 116/74  Pulse: 62  Temp: 97.9 F (36.6 C)  SpO2: 97%   BP Readings from Last 3 Encounters:  08/06/22 116/74  03/06/22 118/64  02/05/22 108/72   Wt Readings from Last 3 Encounters:  08/06/22 183 lb (83 kg)  02/05/22 182 lb (82.6 kg)  12/16/21 187 lb 6.4 oz (85 kg)   Body mass index is 29.54 kg/m.    Physical Exam Constitutional:      General: She is not in acute distress.    Appearance: Normal appearance.  HENT:     Head: Normocephalic and atraumatic.  Eyes:     Conjunctiva/sclera: Conjunctivae normal.  Cardiovascular:     Rate and Rhythm: Normal rate and regular rhythm.     Heart sounds: Normal heart sounds. No murmur heard. Pulmonary:      Effort: Pulmonary effort is normal. No respiratory distress.     Breath sounds: Normal breath sounds. No wheezing.  Musculoskeletal:     Cervical back: Neck supple.     Right lower leg: No edema.     Left lower leg: No edema.  Lymphadenopathy:     Cervical: No cervical adenopathy.  Skin:    General: Skin is warm and dry.     Findings: No rash.  Neurological:     Mental Status: She is alert. Mental status is at baseline.  Psychiatric:        Mood and Affect: Mood normal.        Behavior: Behavior normal.        Lab Results  Component Value Date   WBC 5.5 08/05/2021   HGB 12.9 08/05/2021   HCT 39.3 08/05/2021   PLT 242.0 08/05/2021   GLUCOSE 90 02/05/2022   CHOL 161 02/05/2022   TRIG 107.0 02/05/2022   HDL 59.80 02/05/2022   LDLDIRECT 115.0 07/05/2019   LDLCALC 80 02/05/2022   ALT 16 02/05/2022   AST 16 02/05/2022   NA 138 02/05/2022   K 4.7 02/05/2022   CL 104 02/05/2022   CREATININE 0.93 02/05/2022   BUN 18 02/05/2022   CO2 27 02/05/2022   TSH 2.58 02/05/2022   HGBA1C 5.9 02/05/2022   MICROALBUR 0.7 01/21/2021     Assessment & Plan:    See Problem List for Assessment and Plan of chronic medical problems.

## 2022-08-06 ENCOUNTER — Ambulatory Visit: Payer: Medicare Other | Admitting: Internal Medicine

## 2022-08-06 VITALS — BP 116/74 | HR 62 | Temp 97.9°F | Ht 66.0 in | Wt 183.0 lb

## 2022-08-06 DIAGNOSIS — E038 Other specified hypothyroidism: Secondary | ICD-10-CM | POA: Diagnosis not present

## 2022-08-06 DIAGNOSIS — J45909 Unspecified asthma, uncomplicated: Secondary | ICD-10-CM

## 2022-08-06 DIAGNOSIS — N1831 Chronic kidney disease, stage 3a: Secondary | ICD-10-CM

## 2022-08-06 DIAGNOSIS — R194 Change in bowel habit: Secondary | ICD-10-CM

## 2022-08-06 DIAGNOSIS — R7303 Prediabetes: Secondary | ICD-10-CM

## 2022-08-06 DIAGNOSIS — E7849 Other hyperlipidemia: Secondary | ICD-10-CM

## 2022-08-06 DIAGNOSIS — R051 Acute cough: Secondary | ICD-10-CM

## 2022-08-06 DIAGNOSIS — K219 Gastro-esophageal reflux disease without esophagitis: Secondary | ICD-10-CM | POA: Diagnosis not present

## 2022-08-06 LAB — TSH: TSH: 2.17 u[IU]/mL (ref 0.35–5.50)

## 2022-08-06 LAB — COMPREHENSIVE METABOLIC PANEL
ALT: 20 U/L (ref 0–35)
AST: 18 U/L (ref 0–37)
Albumin: 4.2 g/dL (ref 3.5–5.2)
Alkaline Phosphatase: 51 U/L (ref 39–117)
BUN: 17 mg/dL (ref 6–23)
CO2: 24 mEq/L (ref 19–32)
Calcium: 9.2 mg/dL (ref 8.4–10.5)
Chloride: 103 mEq/L (ref 96–112)
Creatinine, Ser: 0.86 mg/dL (ref 0.40–1.20)
GFR: 64.35 mL/min (ref >60.00)
Glucose, Bld: 90 mg/dL (ref 70–99)
Potassium: 3.9 mEq/L (ref 3.5–5.1)
Sodium: 136 mEq/L (ref 135–145)
Total Bilirubin: 0.6 mg/dL (ref 0.2–1.2)
Total Protein: 6.8 g/dL (ref 6.0–8.3)

## 2022-08-06 LAB — LIPID PANEL
Cholesterol: 150 mg/dL (ref 0–200)
HDL: 59.8 mg/dL (ref >39.00)
LDL Cholesterol: 74 mg/dL (ref 0–99)
NonHDL: 89.72
Total CHOL/HDL Ratio: 3
Triglycerides: 80 mg/dL (ref 0.0–149.0)
VLDL: 16 mg/dL (ref 0.0–40.0)

## 2022-08-06 LAB — HEMOGLOBIN A1C: Hgb A1c MFr Bld: 5.9 % (ref 4.6–6.5)

## 2022-08-06 MED ORDER — HYDROCOD POLI-CHLORPHE POLI ER 10-8 MG/5ML PO SUER: 5.0000 mL | Freq: Two times a day (BID) | ORAL | 0 refills | Status: DC | PRN

## 2022-08-06 NOTE — Assessment & Plan Note (Signed)
Chronic  Clinically euthyroid Check tsh and will titrate med dose if needed Currently taking levothyroxine 50 mcg daily 

## 2022-08-06 NOTE — Assessment & Plan Note (Signed)
Started 2 weeks ago Mostly dry, Occasionally productive Does not feel like a typical asthma flare  No other cold symptoms so I do not think is an infection Possible atypical flare of her asthma-advised to try increasing Symbicort to twice a day in the next 7-10 days and using the albuterol inhaler to see if that helps Tussionex cough syrup sent to pharmacy No need for an antibiotic at this point She will update me with her symptoms so we can adjust treatment as needed

## 2022-08-06 NOTE — Assessment & Plan Note (Signed)
Chronic Check a1c Low sugar / carb diet Stressed regular exercise  

## 2022-08-06 NOTE — Assessment & Plan Note (Addendum)
Chronic GERD controlled Continue famotidine 20-40 mg daily

## 2022-08-06 NOTE — Assessment & Plan Note (Addendum)
Chronic Overall well controlled She is having some cough seen-?  Atypical flare-usually she has wheezing or tightness and she does not have that Continue Symbicort 80-4.5 mcg per ACT once daily-increase this to twice daily for the next 7-10 days to see if that helps Continue albuterol inhaler as needed-consider using it more in the next week-10 days to see if that helps Continue montelukast 10 mg nightly

## 2022-08-06 NOTE — Assessment & Plan Note (Signed)
Chronic Regular exercise and healthy diet encouraged Check lipid panel  Continue pravastatin 40 mg daily 

## 2022-08-06 NOTE — Assessment & Plan Note (Signed)
Chronic Mild Stable CMP

## 2022-08-18 ENCOUNTER — Encounter: Payer: Self-pay | Admitting: Internal Medicine

## 2022-08-20 ENCOUNTER — Other Ambulatory Visit: Payer: Self-pay

## 2022-08-20 DIAGNOSIS — E038 Other specified hypothyroidism: Secondary | ICD-10-CM

## 2022-08-20 DIAGNOSIS — J45909 Unspecified asthma, uncomplicated: Secondary | ICD-10-CM

## 2022-08-21 ENCOUNTER — Other Ambulatory Visit (HOSPITAL_BASED_OUTPATIENT_CLINIC_OR_DEPARTMENT_OTHER): Payer: Self-pay

## 2022-08-21 MED ORDER — COMIRNATY 30 MCG/0.3ML IM SUSY
PREFILLED_SYRINGE | INTRAMUSCULAR | 0 refills | Status: DC
  Filled 2022-08-21: qty 0.3, 1d supply, fill #0

## 2022-08-24 ENCOUNTER — Other Ambulatory Visit: Payer: Self-pay

## 2022-08-24 MED ORDER — ALBUTEROL SULFATE HFA 108 (90 BASE) MCG/ACT IN AERS: INHALATION_SPRAY | RESPIRATORY_TRACT | 3 refills | Status: DC

## 2022-08-24 MED ORDER — LEVOTHYROXINE SODIUM 50 MCG PO TABS: ORAL_TABLET | ORAL | 3 refills | Status: DC

## 2022-08-24 MED ORDER — MONTELUKAST SODIUM 10 MG PO TABS: 10.0000 mg | ORAL_TABLET | Freq: Every day | ORAL | 3 refills | Status: DC

## 2022-08-24 MED ORDER — PRAVASTATIN SODIUM 40 MG PO TABS: 40.0000 mg | ORAL_TABLET | Freq: Every day | ORAL | 3 refills | Status: DC

## 2022-08-24 MED ORDER — BUDESONIDE-FORMOTEROL FUMARATE 80-4.5 MCG/ACT IN AERO: INHALATION_SPRAY | RESPIRATORY_TRACT | 3 refills | Status: DC

## 2022-08-26 ENCOUNTER — Other Ambulatory Visit: Payer: Self-pay | Admitting: Nephrology

## 2022-08-26 DIAGNOSIS — R944 Abnormal results of kidney function studies: Secondary | ICD-10-CM

## 2022-08-31 ENCOUNTER — Encounter: Payer: Self-pay | Admitting: Internal Medicine

## 2022-09-01 ENCOUNTER — Ambulatory Visit
Admission: RE | Admit: 2022-09-01 | Discharge: 2022-09-01 | Disposition: A | Discharge status: Home / Self Care | Payer: Medicare Other | Source: Ambulatory Visit | Attending: Nephrology | Admitting: Nephrology

## 2022-09-01 DIAGNOSIS — R944 Abnormal results of kidney function studies: Secondary | ICD-10-CM

## 2022-09-10 ENCOUNTER — Encounter: Payer: Self-pay | Admitting: Obstetrics & Gynecology

## 2022-09-10 ENCOUNTER — Ambulatory Visit (INDEPENDENT_AMBULATORY_CARE_PROVIDER_SITE_OTHER): Payer: Medicare Other | Admitting: Obstetrics & Gynecology

## 2022-09-10 VITALS — BP 116/70 | HR 82 | Ht 65.75 in | Wt 185.0 lb

## 2022-09-10 DIAGNOSIS — Z9289 Personal history of other medical treatment: Secondary | ICD-10-CM

## 2022-09-10 DIAGNOSIS — Z13820 Encounter for screening for osteoporosis: Secondary | ICD-10-CM | POA: Diagnosis not present

## 2022-09-10 DIAGNOSIS — Z90710 Acquired absence of both cervix and uterus: Secondary | ICD-10-CM | POA: Diagnosis not present

## 2022-09-10 DIAGNOSIS — Z780 Asymptomatic menopausal state: Secondary | ICD-10-CM | POA: Diagnosis not present

## 2022-09-10 DIAGNOSIS — B009 Herpesviral infection, unspecified: Secondary | ICD-10-CM

## 2022-09-10 DIAGNOSIS — Z4689 Encounter for fitting and adjustment of other specified devices: Secondary | ICD-10-CM

## 2022-09-10 DIAGNOSIS — Z9189 Other specified personal risk factors, not elsewhere classified: Secondary | ICD-10-CM | POA: Diagnosis not present

## 2022-09-10 DIAGNOSIS — Z01419 Encounter for gynecological examination (general) (routine) without abnormal findings: Secondary | ICD-10-CM

## 2022-09-10 NOTE — Progress Notes (Signed)
DONNETTE MACMULLEN 04-29-1941 [id number]   History:    81 y.o.  G0 Married.  At [facility] since 12/2018.  Visited [place].   RP:  Established patient presenting for annual gyn exam    HPI: Postmenopause, well on no HRT.  S/P TAH for Fibroids.  No pelvic pain.  Well with pessary Milex ring #1 with support, removal/insertion painful, would like to try without.  No bleeding or discharge.  Abstinent.  Last Pap Neg 03/2017. Urine and bowel movements normal.  Breasts normal.  Screening mammo Neg 06/2022.  Body mass index 30.09.  Walking.  Health labs with family physician. Colonoscopy 03/2019.  BD normal in 04/2017.  Flu vaccine received at the pharmacy.    Past medical history,surgical history, family history and social history were all reviewed and documented in the EPIC chart.  Gynecologic History No LMP recorded. Patient is postmenopausal.  Obstetric History OB History  Gravida Para Term Preterm AB Living  0 0 0 0 0 0  SAB IAB Ectopic Multiple Live Births  0 0 0 0 0     ROS: A ROS was performed and pertinent positives and negatives are included in the history. GENERAL: No fevers or chills. HEENT: No change in vision, no earache, sore throat or sinus congestion. NECK: No pain or stiffness. CARDIOVASCULAR: No chest pain or pressure. No palpitations. PULMONARY: No shortness of breath, cough or wheeze. GASTROINTESTINAL: No abdominal pain, nausea, vomiting or diarrhea, melena or bright red blood per rectum. GENITOURINARY: No urinary frequency, urgency, hesitancy or dysuria. MUSCULOSKELETAL: No joint or muscle pain, no back pain, no recent trauma. DERMATOLOGIC: No rash, no itching, no lesions. ENDOCRINE: No polyuria, polydipsia, no heat or cold intolerance. No recent change in weight. HEMATOLOGICAL: No anemia or easy bruising or bleeding. NEUROLOGIC: No headache, seizures, numbness, tingling or weakness. PSYCHIATRIC: No depression, no loss of interest in normal activity or change in sleep  pattern.     Exam:   BP 116/70   Pulse 82   Ht 5' 5.75" (1.67 m)   Wt 185 lb (83.9 kg)   SpO2 96%   BMI 30.09 kg/m   Body mass index is 30.09 kg/m.  General appearance : Well developed well nourished female. No acute distress HEENT: Eyes: no retinal hemorrhage or exudates,  Neck supple, trachea midline, no carotid bruits, no thyroidmegaly Lungs: Clear to auscultation, no rhonchi or wheezes, or rib retractions  Heart: Regular rate and rhythm, no murmurs or gallops Breast:Examined in sitting and supine position were symmetrical in appearance, no palpable masses or tenderness,  no skin retraction, no nipple inversion, no nipple discharge, no skin discoloration, no axillary or supraclavicular lymphadenopathy Abdomen: no palpable masses or tenderness, no rebound or guarding Extremities: no edema or skin discoloration or tenderness  Pelvic: Vulva: Normal             Vagina: No gross lesions or discharge.  Minimal cystocele grade 1/4.  Cervix: No gross lesions or discharge  Uterus  AV, normal size, shape and consistency, non-tender and mobile  Adnexa  Without masses or tenderness  Anus: Normal  Pessary removed and cleaned.  Decision to try without the pessary.   Assessment/Plan:  81 y.o. female for annual exam   1. Well female exam with routine gynecological exam Postmenopause, well on no HRT.  S/P TAH for Fibroids.  No pelvic pain. Well with pessary Milex ring #1 with support, removal/insertion painful, would like to try without.  No bleeding or discharge.  Abstinent.  Last Pap Neg 03/2017. Urine and bowel movements normal.  Breasts normal.  Screening mammo Neg 06/2022.  Body mass index 30.09.  Walking.  Health labs with family physician. Colonoscopy 03/2019.  BD normal in 04/2017.  Flu vaccine received at the pharmacy.   2. Hx of total hysterectomy  3. Postmenopause Postmenopause, well on no HRT.  S/P TAH for Fibroids.  No pelvic pain.   Abstinent.  4. Screening for  osteoporosis BD in 2018 normal.  Will repeat now. - DG Bone Density; Future  5. Pessary maintenance Well with pessary Milex ring #1 with support, removal/insertion painful, would like to try without.  No bleeding or discharge.  Pessary removed. Minimal Cystocele grade 1/4.  Pessary not reinserted, will call if desires reinsertion of pessary.   6. Other specified personal risk factors, not elsewhere classified  7. Personal history of other medical treatment   Princess Bruins MD, 1:49 PM 09/10/2022

## 2022-09-12 ENCOUNTER — Encounter: Payer: Self-pay | Admitting: Obstetrics & Gynecology

## 2022-09-30 NOTE — Progress Notes (Signed)
Assessment/Plan:    1.  Essential Tremor.             -This is evidenced by the symmetrical nature and longstanding hx of gradually getting worse.  We discussed nature and pathophysiology.  We discussed that this can continue to gradually get worse with time.  We discussed that some medications can worsen this, as can caffeine use.  We discussed medication therapy as well as surgical therapy.  We discussed nonmedicinal therapies such as weighted gloves, weighted spoons, weighted forks, [employer], readi steadi.  Patient literature was given on these things.  Ultimately, the patient decided to hold off on any treatments for now.               -Patient was given primidone in the past but worried about side effects that she read on the Internet, including vertigo.  Discussed with her that it is fine that she does not want to take medication right now, but would not read about side effects on the Internet related to primidone, because those were typically much higher dosages, given the seizure patients. She asked about asthma related to propranolol.  Discussed this today.             -we discussed DBS and focused ultrasound last visit but really not interested/ready     2.  Bppv             -Intermittent.  Is getting ready to see ENT.  Currently asymptomatic.  Often resolved with epley at home per pt  3.  Told her f/u prn but she states that she really would like to f/u yearly.  I have no objection to that.  Subjective:   Melissa Lowe was seen today in follow up for essential tremor.  This patient is accompanied in the office by her spouse who supplements the history.  My previous records were reviewed prior to todays visit.  I last saw the patient just over a year ago.  At that time, she wanted no treatment.  She was given information on various treatments for essential tremor.  She had been given primidone in the past, but did not take it because she worried about side effects.  She states  that tremor is a little increased due to stress; they are building a new home.  Otherwise, patient states that tremor is not really increased (besides for when stress is increased).  Pt states that husband notes lip tremor.  If eating with plastic, she will throw the food.  She doesn't want more medications.    Current prescribed movement disorder medications: N/a   ALLERGIES:   Allergies  Allergen Reactions   Sulfonamide Derivatives     Rash on inside of arms Because of a history of documented adverse serious drug reaction;Medi Alert bracelet  is recommended   Prednisone Other (See Comments)    ? allergy   Tetracycline     Severe itching on lower leg    CURRENT MEDICATIONS:  Current Meds  Medication Sig   albuterol (VENTOLIN HFA) 108 (90 Base) MCG/ACT inhaler USE 1 INHALATION BY MOUTH  EVERY 6 HOURS AS NEEDED FOR WHEEZING OR SHORTNESS OF  BREATH   Ascorbic Acid (VITAMIN C PO) Take 500 mg by mouth daily.   bimatoprost (LATISSE) 0.03 % ophthalmic solution Place into both eyes. apply evenly along the skin of the upper eyelid at base of eyelashes 3xs weekly at bedtime; repeat procedure for second eye (use a clean applicator).  budesonide-formoterol (SYMBICORT) 80-4.5 MCG/ACT inhaler USE 1 INHALATION BY MOUTH  DAILY PER THE MANUFACTURER,  EXPIRES 3 MONTHS AFTER  OPENING OVERWRAP   chlorpheniramine-HYDROcodone (TUSSIONEX) 10-8 MG/5ML Take 5 mLs by mouth every 12 (twelve) hours as needed for cough.   ELDERBERRY PO Take by mouth. [place] Liquid.   famotidine (PEPCID) 20 MG tablet Take 40 mg by mouth daily.    levothyroxine (SYNTHROID) 50 MCG tablet TAKE 1 TABLET BY MOUTH IN  THE MORNING   montelukast (SINGULAIR) 10 MG tablet Take 1 tablet (10 mg total) by mouth daily.   pravastatin (PRAVACHOL) 40 MG tablet Take 1 tablet (40 mg total) by mouth at bedtime.   VITAMIN D PO Take 1,000 mg by mouth daily.      Objective:    PHYSICAL EXAMINATION:    VITALS:   Vitals:   10/02/22 1037   Weight: 183 lb 3.2 oz (83.1 kg)  Height: '5\' 5"'$  (1.651 m)    GEN:  The patient appears stated age and is in NAD. HEENT:  Normocephalic, atraumatic.  The mucous membranes are moist. The superficial temporal arteries are without ropiness or tenderness. CV:  RRR Lungs:  CTAB Neck/HEME:  There are no carotid bruits bilaterally.  Neurological examination:  Orientation:  The patient is alert and oriented x 3.   Cranial nerves: There is good facial symmetry. Extraocular muscles are intact and visual fields are full to confrontational testing. Speech is fluent and clear. Soft palate rises symmetrically and there is no tongue deviation. Hearing is intact to conversational tone. Tone: Tone is good throughout. Sensation: Sensation is intact to light touch touch throughout  Coordination:  The patient has no dysdiadichokinesia or dysmetria. Motor: Strength is 5/5 in the bilateral upper and lower extremities.  Shoulder shrug is equal bilaterally.  There is no pronator drift.  There are no fasciculations noted. Gait and Station: The patient is able to ambulate without difficulty.    MOVEMENT EXAM: Tremor:  There is rest tremor independently in both hands, only when writing.  There is some head tremor in the yes direction.  There is some chin tremor (same as previous).  There is intention tremor, mod on the L, and more mild-mod on the right.  archimedes spirals are actually a bit better than a year ago.  The R is better than the L I have reviewed and interpreted the following labs independently   Chemistry      Component Value Date/Time   NA 136 08/06/2022 1144   K 3.9 08/06/2022 1144   CL 103 08/06/2022 1144   CO2 24 08/06/2022 1144   BUN 17 08/06/2022 1144   CREATININE 0.86 08/06/2022 1144   CREATININE 0.97 (H) 07/09/2020 1052      Component Value Date/Time   CALCIUM 9.2 08/06/2022 1144   ALKPHOS 51 08/06/2022 1144   AST 18 08/06/2022 1144   ALT 20 08/06/2022 1144   BILITOT 0.6 08/06/2022  1144      Lab Results  Component Value Date   WBC 5.5 08/05/2021   HGB 12.9 08/05/2021   HCT 39.3 08/05/2021   MCV 89.5 08/05/2021   PLT 242.0 08/05/2021   Lab Results  Component Value Date   TSH 2.17 08/06/2022     Chemistry      Component Value Date/Time   NA 136 08/06/2022 1144   K 3.9 08/06/2022 1144   CL 103 08/06/2022 1144   CO2 24 08/06/2022 1144   BUN 17 08/06/2022 1144   CREATININE 0.86 08/06/2022  1144   CREATININE 0.97 (H) 07/09/2020 1052      Component Value Date/Time   CALCIUM 9.2 08/06/2022 1144   ALKPHOS 51 08/06/2022 1144   AST 18 08/06/2022 1144   ALT 20 08/06/2022 1144   BILITOT 0.6 08/06/2022 1144        Cc:  Binnie Rail, MD

## 2022-10-02 ENCOUNTER — Ambulatory Visit (INDEPENDENT_AMBULATORY_CARE_PROVIDER_SITE_OTHER): Payer: Medicare Other | Admitting: Neurology

## 2022-10-02 ENCOUNTER — Encounter: Payer: Self-pay | Admitting: Neurology

## 2022-10-02 ENCOUNTER — Other Ambulatory Visit: Payer: Self-pay

## 2022-10-02 VITALS — BP 121/78 | HR 61 | Ht 65.0 in | Wt 183.2 lb

## 2022-10-02 DIAGNOSIS — G250 Essential tremor: Secondary | ICD-10-CM

## 2022-10-02 MED ORDER — AREXVY 120 MCG/0.5ML IM SUSR
0.5000 mL | Freq: Once | INTRAMUSCULAR | 0 refills | Status: AC
  Filled 2022-10-02: qty 0.5, 1d supply, fill #0

## 2022-10-02 NOTE — Patient Instructions (Addendum)
Try weighted gloves, like handi things (on amazon) and weighted spoons/forks.    The physicians and staff at [facility] are committed to providing excellent care. You may receive a survey requesting feedback about your experience at our office. We strive to receive "very good" responses to the survey questions. If you feel that your experience would prevent you from giving the office a "very good " response, please contact our office to try to remedy the situation. We may be reached at [phone]. Thank you for taking the time out of your busy day to complete the survey.

## 2022-10-26 ENCOUNTER — Other Ambulatory Visit: Payer: Self-pay | Admitting: Internal Medicine

## 2022-12-11 ENCOUNTER — Other Ambulatory Visit: Payer: Self-pay | Admitting: Orthopaedic Surgery

## 2022-12-11 DIAGNOSIS — M47814 Spondylosis without myelopathy or radiculopathy, thoracic region: Secondary | ICD-10-CM

## 2022-12-11 DIAGNOSIS — M47816 Spondylosis without myelopathy or radiculopathy, lumbar region: Secondary | ICD-10-CM

## 2022-12-11 DIAGNOSIS — M5136 Other intervertebral disc degeneration, lumbar region: Secondary | ICD-10-CM

## 2022-12-29 ENCOUNTER — Other Ambulatory Visit: Payer: Self-pay | Admitting: Obstetrics & Gynecology

## 2022-12-29 ENCOUNTER — Ambulatory Visit (INDEPENDENT_AMBULATORY_CARE_PROVIDER_SITE_OTHER): Payer: Medicare Other

## 2022-12-29 DIAGNOSIS — Z780 Asymptomatic menopausal state: Secondary | ICD-10-CM | POA: Diagnosis not present

## 2022-12-29 DIAGNOSIS — Z13820 Encounter for screening for osteoporosis: Secondary | ICD-10-CM | POA: Diagnosis not present

## 2022-12-29 DIAGNOSIS — Z90710 Acquired absence of both cervix and uterus: Secondary | ICD-10-CM

## 2022-12-29 DIAGNOSIS — Z01419 Encounter for gynecological examination (general) (routine) without abnormal findings: Secondary | ICD-10-CM

## 2022-12-29 DIAGNOSIS — Z4689 Encounter for fitting and adjustment of other specified devices: Secondary | ICD-10-CM

## 2022-12-29 DIAGNOSIS — Z9289 Personal history of other medical treatment: Secondary | ICD-10-CM

## 2022-12-29 DIAGNOSIS — Z9189 Other specified personal risk factors, not elsewhere classified: Secondary | ICD-10-CM

## 2023-01-11 ENCOUNTER — Ambulatory Visit
Admission: RE | Admit: 2023-01-11 | Discharge: 2023-01-11 | Disposition: A | Discharge status: Home / Self Care | Payer: Medicare Other | Source: Ambulatory Visit | Attending: Orthopaedic Surgery | Admitting: Orthopaedic Surgery

## 2023-01-11 DIAGNOSIS — M47814 Spondylosis without myelopathy or radiculopathy, thoracic region: Secondary | ICD-10-CM

## 2023-01-11 DIAGNOSIS — M5136 Other intervertebral disc degeneration, lumbar region: Secondary | ICD-10-CM

## 2023-01-11 DIAGNOSIS — M47816 Spondylosis without myelopathy or radiculopathy, lumbar region: Secondary | ICD-10-CM

## 2023-01-11 MED ORDER — GADOPICLENOL 0.5 MMOL/ML IV SOLN
9.0000 mL | Freq: Once | INTRAVENOUS | Status: AC | PRN
  Administered 2023-01-11: 9 mL via INTRAVENOUS

## 2023-02-10 ENCOUNTER — Encounter: Payer: Self-pay | Admitting: Internal Medicine

## 2023-02-10 NOTE — Progress Notes (Unsigned)
Subjective:    Patient ID: Melissa Lowe, female    DOB: 07-25-1941, 82 y.o.   MRN: [id number]      HPI Melissa Lowe is here for a Physical exam and her chronic medical problems.   Chronic back pain -most of her pain is across her mid back near where her bra strap is-has been diagnosed with scoliosis.  Did PT and that helped a lot.  She is doing exercises to help, but not as regularly as she should.  Occ prick area in heart area, the other night had chest pain that lasted a few min.    Medications and allergies reviewed with patient and updated if appropriate.  Current Outpatient Medications on File Prior to Visit  Medication Sig Dispense Refill   albuterol (VENTOLIN HFA) 108 (90 Base) MCG/ACT inhaler USE 1 INHALATION BY MOUTH EVERY  6 HOURS AS NEEDED FOR WHEEZING  OR SHORTNESS OF BREATH 36 g 3   Ascorbic Acid (VITAMIN C PO) Take 500 mg by mouth daily.     bimatoprost (LATISSE) 0.03 % ophthalmic solution Place into both eyes. apply evenly along the skin of the upper eyelid at base of eyelashes 3xs weekly at bedtime; repeat procedure for second eye (use a clean applicator).     budesonide-formoterol (SYMBICORT) 80-4.5 MCG/ACT inhaler USE 1 INHALATION BY MOUTH  DAILY PER THE MANUFACTURER,  EXPIRES 3 MONTHS AFTER  OPENING OVERWRAP 10.2 g 3   ELDERBERRY PO Take by mouth. [place]oli Liquid.     famotidine (PEPCID) 20 MG tablet Take 40 mg by mouth daily.      levothyroxine (SYNTHROID) 50 MCG tablet TAKE 1 TABLET BY MOUTH IN  THE MORNING 90 tablet 3   montelukast (SINGULAIR) 10 MG tablet Take 1 tablet (10 mg total) by mouth daily. 90 tablet 3   pravastatin (PRAVACHOL) 40 MG tablet Take 1 tablet (40 mg total) by mouth at bedtime. 90 tablet 3   VITAMIN D PO Take 1,000 mg by mouth daily.     No current facility-administered medications on file prior to visit.    Review of Systems  Constitutional:  Negative for fever.  Eyes:  Negative for visual disturbance.  Respiratory:  Negative for cough,  shortness of breath and wheezing.   Cardiovascular:  Negative for chest pain, palpitations and leg swelling.  Gastrointestinal:  Negative for abdominal pain, blood in stool, constipation and diarrhea.        Gerd controlled - throat clearing  Genitourinary:  Negative for dysuria.  Musculoskeletal:  Positive for arthralgias (knees ache) and back pain.  Skin:  Negative for rash.  Neurological:  Positive for dizziness (recent episode - did epley manuver) and headaches (occ).  Psychiatric/Behavioral:  Negative for dysphoric mood. The patient is not nervous/anxious.        Objective:   Vitals:   02/11/23 1039  BP: 120/78  Pulse: 68  Temp: 98.3 F (36.8 C)  SpO2: 97%   Filed Weights   02/11/23 1039  Weight: 179 lb (81.2 kg)   Body mass index is 29.79 kg/m.  BP Readings from Last 3 Encounters:  02/11/23 120/78  10/02/22 121/78  09/10/22 116/70    Wt Readings from Last 3 Encounters:  02/11/23 179 lb (81.2 kg)  10/02/22 183 lb 3.2 oz (83.1 kg)  09/10/22 185 lb (83.9 kg)       Physical Exam Constitutional: She appears well-developed and well-nourished. No distress.  HENT:  Head: Normocephalic and atraumatic.  Right Ear: External ear normal. Normal  ear canal and TM Left Ear: External ear normal.  Normal ear canal and TM Mouth/Throat: Oropharynx is clear and moist.  Eyes: Conjunctivae normal.  Neck: Neck supple. No tracheal deviation present. No thyromegaly present.  No carotid bruit  Cardiovascular: Normal rate, regular rhythm and normal heart sounds.   No murmur heard.  No edema. Pulmonary/Chest: Effort normal and breath sounds normal. No respiratory distress. She has no wheezes. She has no rales.  Abdominal: Soft. She exhibits no distension. There is no tenderness.  Lymphadenopathy: She has no cervical adenopathy.  Skin: Skin is warm and dry. She is not diaphoretic.  Psychiatric: She has a normal mood and affect. Her behavior is normal.     Lab Results   Component Value Date   WBC 5.5 08/05/2021   HGB 12.9 08/05/2021   HCT 39.3 08/05/2021   PLT 242.0 08/05/2021   GLUCOSE 90 08/06/2022   CHOL 150 08/06/2022   TRIG 80.0 08/06/2022   HDL 59.80 08/06/2022   LDLDIRECT 115.0 07/05/2019   LDLCALC 74 08/06/2022   ALT 20 08/06/2022   AST 18 08/06/2022   NA 136 08/06/2022   K 3.9 08/06/2022   CL 103 08/06/2022   CREATININE 0.86 08/06/2022   BUN 17 08/06/2022   CO2 24 08/06/2022   TSH 2.17 08/06/2022   HGBA1C 5.9 08/06/2022   MICROALBUR 0.7 01/21/2021         Assessment & Plan:   Physical exam: Screening blood work  ordered Exercise  not regular-encourage regular exercise Weight  ok for age Substance abuse  none   Reviewed recommended immunizations.   Health Maintenance  Topic Date Due   Medicare Annual Wellness (AWV)  07/01/2018   Hepatitis C Screening  07/09/2069 (Originally 05/25/1959)   INFLUENZA VACCINE  05/27/2023   DTaP/Tdap/Td (2 - Td or Tdap) 10/09/2024   DEXA SCAN  12/29/2027   Pneumonia Vaccine 71+ Years old  Completed   COVID-19 Vaccine  Completed   Zoster Vaccines- Shingrix  Completed   HPV VACCINES  Aged Out   COLONOSCOPY (Pts 45-37yrs Insurance coverage will need to be confirmed)  Discontinued          See Problem List for Assessment and Plan of chronic medical problems.

## 2023-02-10 NOTE — Patient Instructions (Addendum)
Blood work was ordered.   The lab is on the first floor.    Medications changes include :   none     Return in about 6 months (around 08/13/2023) for follow up.    Health Maintenance, Female Adopting a healthy lifestyle and getting preventive care are important in promoting health and wellness. Ask your health care provider about: The right schedule for you to have regular tests and exams. Things you can do on your own to prevent diseases and keep yourself healthy. What should I know about diet, weight, and exercise? Eat a healthy diet  Eat a diet that includes plenty of vegetables, fruits, low-fat dairy products, and lean protein. Do not eat a lot of foods that are high in solid fats, added sugars, or sodium. Maintain a healthy weight Body mass index (BMI) is used to identify weight problems. It estimates body fat based on height and weight. Your health care provider can help determine your BMI and help you achieve or maintain a healthy weight. Get regular exercise Get regular exercise. This is one of the most important things you can do for your health. Most adults should: Exercise for at least 150 minutes each week. The exercise should increase your heart rate and make you sweat (moderate-intensity exercise). Do strengthening exercises at least twice a week. This is in addition to the moderate-intensity exercise. Spend less time sitting. Even light physical activity can be beneficial. Watch cholesterol and blood lipids Have your blood tested for lipids and cholesterol at 80 years of age, then have this test every 5 years. Have your cholesterol levels checked more often if: Your lipid or cholesterol levels are high. You are older than 80 years of age. You are at high risk for heart disease. What should I know about cancer screening? Depending on your health history and family history, you may need to have cancer screening at various ages. This may include screening  for: Breast cancer. Cervical cancer. Colorectal cancer. Skin cancer. Lung cancer. What should I know about heart disease, diabetes, and high blood pressure? Blood pressure and heart disease High blood pressure causes heart disease and increases the risk of stroke. This is more likely to develop in people who have high blood pressure readings or are overweight. Have your blood pressure checked: Every 3-5 years if you are 27-81 years of age. Every year if you are 44 years old or older. Diabetes Have regular diabetes screenings. This checks your fasting blood sugar level. Have the screening done: Once every three years after age 69 if you are at a normal weight and have a low risk for diabetes. More often and at a younger age if you are overweight or have a high risk for diabetes. What should I know about preventing infection? Hepatitis B If you have a higher risk for hepatitis B, you should be screened for this virus. Talk with your health care provider to find out if you are at risk for hepatitis B infection. Hepatitis C Testing is recommended for: Everyone born from [birth year] through 1963. Anyone with known risk factors for hepatitis C. Sexually transmitted infections (STIs) Get screened for STIs, including gonorrhea and chlamydia, if: You are sexually active and are younger than 80 years of age. You are older than 80 years of age and your health care provider tells you that you are at risk for this type of infection. Your sexual activity has changed since you were last screened, and you are  at increased risk for chlamydia or gonorrhea. Ask your health care provider if you are at risk. Ask your health care provider about whether you are at high risk for HIV. Your health care provider may recommend a prescription medicine to help prevent HIV infection. If you choose to take medicine to prevent HIV, you should first get tested for HIV. You should then be tested every 3 months for as long as you  are taking the medicine. Pregnancy If you are about to stop having your period (premenopausal) and you may become pregnant, seek counseling before you get pregnant. Take 400 to 800 micrograms (mcg) of folic acid every day if you become pregnant. Ask for birth control (contraception) if you want to prevent pregnancy. Osteoporosis and menopause Osteoporosis is a disease in which the bones lose minerals and strength with aging. This can result in bone fractures. If you are 71 years old or older, or if you are at risk for osteoporosis and fractures, ask your health care provider if you should: Be screened for bone loss. Take a calcium or vitamin D supplement to lower your risk of fractures. Be given hormone replacement therapy (HRT) to treat symptoms of menopause. Follow these instructions at home: Alcohol use Do not drink alcohol if: Your health care provider tells you not to drink. You are pregnant, may be pregnant, or are planning to become pregnant. If you drink alcohol: Limit how much you have to: 0-1 drink a day. Know how much alcohol is in your drink. In the [place] one drink equals one 12 oz bottle of beer (355 mL), one 5 oz glass of wine (148 mL), or one 1 oz glass of hard liquor (44 mL). Lifestyle Do not use any products that contain nicotine or tobacco. These products include cigarettes, chewing tobacco, and vaping devices, such as e-cigarettes. If you need help quitting, ask your health care provider. Do not use street drugs. Do not share needles. Ask your health care provider for help if you need support or information about quitting drugs. General instructions Schedule regular health, dental, and eye exams. Stay current with your vaccines. Tell your health care provider if: You often feel depressed. You have ever been abused or do not feel safe at home. Summary Adopting a healthy lifestyle and getting preventive care are important in promoting health and wellness. Follow your  health care provider's instructions about healthy diet, exercising, and getting tested or screened for diseases. Follow your health care provider's instructions on monitoring your cholesterol and blood pressure. This information is not intended to replace advice given to you by your health care provider. Make sure you discuss any questions you have with your health care provider. Document Revised: 03/03/2021 Document Reviewed: 03/03/2021 Elsevier Patient Education  [place].

## 2023-02-11 ENCOUNTER — Ambulatory Visit (INDEPENDENT_AMBULATORY_CARE_PROVIDER_SITE_OTHER): Payer: Medicare Other | Admitting: Internal Medicine

## 2023-02-11 VITALS — BP 120/78 | HR 68 | Temp 98.3°F | Ht 65.0 in | Wt 179.0 lb

## 2023-02-11 DIAGNOSIS — G250 Essential tremor: Secondary | ICD-10-CM

## 2023-02-11 DIAGNOSIS — K219 Gastro-esophageal reflux disease without esophagitis: Secondary | ICD-10-CM

## 2023-02-11 DIAGNOSIS — N1831 Chronic kidney disease, stage 3a: Secondary | ICD-10-CM

## 2023-02-11 DIAGNOSIS — N182 Chronic kidney disease, stage 2 (mild): Secondary | ICD-10-CM

## 2023-02-11 DIAGNOSIS — R7303 Prediabetes: Secondary | ICD-10-CM | POA: Diagnosis not present

## 2023-02-11 DIAGNOSIS — J45909 Unspecified asthma, uncomplicated: Secondary | ICD-10-CM

## 2023-02-11 DIAGNOSIS — E038 Other specified hypothyroidism: Secondary | ICD-10-CM

## 2023-02-11 DIAGNOSIS — E7849 Other hyperlipidemia: Secondary | ICD-10-CM

## 2023-02-11 DIAGNOSIS — M419 Scoliosis, unspecified: Secondary | ICD-10-CM | POA: Insufficient documentation

## 2023-02-11 DIAGNOSIS — Z0000 Encounter for general adult medical examination without abnormal findings: Secondary | ICD-10-CM

## 2023-02-11 LAB — COMPREHENSIVE METABOLIC PANEL
ALT: 13 U/L (ref 0–35)
AST: 15 U/L (ref 0–37)
Albumin: 4.4 g/dL (ref 3.5–5.2)
Alkaline Phosphatase: 44 U/L (ref 39–117)
BUN: 19 mg/dL (ref 6–23)
CO2: 27 mEq/L (ref 19–32)
Calcium: 9.7 mg/dL (ref 8.4–10.5)
Chloride: 105 mEq/L (ref 96–112)
Creatinine, Ser: 0.93 mg/dL (ref 0.40–1.20)
GFR: 58.37 mL/min — ABNORMAL LOW (ref >60.00)
Glucose, Bld: 91 mg/dL (ref 70–99)
Potassium: 4.3 mEq/L (ref 3.5–5.1)
Sodium: 140 mEq/L (ref 135–145)
Total Bilirubin: 0.6 mg/dL (ref 0.2–1.2)
Total Protein: 6.9 g/dL (ref 6.0–8.3)

## 2023-02-11 LAB — LIPID PANEL
Cholesterol: 153 mg/dL (ref 0–200)
HDL: 62.2 mg/dL (ref >39.00)
LDL Cholesterol: 71 mg/dL (ref 0–99)
NonHDL: 90.76
Total CHOL/HDL Ratio: 2
Triglycerides: 101 mg/dL (ref 0.0–149.0)
VLDL: 20.2 mg/dL (ref 0.0–40.0)

## 2023-02-11 LAB — CBC WITH DIFFERENTIAL/PLATELET
Basophils Absolute: 0.1 10*3/uL (ref 0.0–0.1)
Basophils Relative: 0.9 % (ref 0.0–3.0)
Eosinophils Absolute: 0.1 10*3/uL (ref 0.0–0.7)
Eosinophils Relative: 2.1 % (ref 0.0–5.0)
HCT: 40.6 % (ref 36.0–46.0)
Hemoglobin: 13.6 g/dL (ref 12.0–15.0)
Lymphocytes Relative: 32.7 % (ref 12.0–46.0)
Lymphs Abs: 2 10*3/uL (ref 0.7–4.0)
MCHC: 33.4 g/dL (ref 30.0–36.0)
MCV: 88.9 fl (ref 78.0–100.0)
Monocytes Absolute: 0.5 10*3/uL (ref 0.1–1.0)
Monocytes Relative: 7.9 % (ref 3.0–12.0)
Neutro Abs: 3.5 10*3/uL (ref 1.4–7.7)
Neutrophils Relative %: 56.4 % (ref 43.0–77.0)
Platelets: 276 10*3/uL (ref 150.0–400.0)
RBC: 4.57 Mil/uL (ref 3.87–5.11)
RDW: 14.2 % (ref 11.5–15.5)
WBC: 6.3 10*3/uL (ref 4.0–10.5)

## 2023-02-11 LAB — TSH: TSH: 2.03 u[IU]/mL (ref 0.35–5.50)

## 2023-02-11 LAB — HEMOGLOBIN A1C: Hgb A1c MFr Bld: 5.8 % (ref 4.6–6.5)

## 2023-02-11 NOTE — Assessment & Plan Note (Signed)
Chronic Check a1c Low sugar / carb diet Stressed regular exercise  

## 2023-02-11 NOTE — Assessment & Plan Note (Signed)
Chronic Regular exercise and healthy diet encouraged Check lipid panel  Continue pravastatin 40 mg daily 

## 2023-02-11 NOTE — Assessment & Plan Note (Signed)
Chronic  Clinically euthyroid Check tsh and will titrate med dose if needed Currently taking levothyroxine 50 mcg daily 

## 2023-02-11 NOTE — Assessment & Plan Note (Addendum)
Chronic GERD ? Controlled - no heartburn but clearing throat and bringing up some mucus -likely reflux currently taking 20 mg daily Advised increasing this briefly to get this controlled and then decrease dose

## 2023-02-11 NOTE — Assessment & Plan Note (Addendum)
Did PT - it did help Discomfort in mid-thoracic esp with sitting,, but some lower back pain Will have injections soon with Dr Retia Passe Continue back exercise, core strengthening

## 2023-02-11 NOTE — Assessment & Plan Note (Signed)
Chronic Overall well controlled Continue Symbicort 80-4.5 mcg per ACT twice daily Continue albuterol inhaler as needed Continue montelukast 10 mg nightly

## 2023-02-11 NOTE — Assessment & Plan Note (Signed)
Chronic Mild Stable CMP 

## 2023-02-11 NOTE — Assessment & Plan Note (Signed)
Chronic ?Stable ?Would like to avoid medication ?Has seen neurology ?

## 2023-02-23 LAB — CREATININE CLEARANCE, URINE, 24 HOUR: EGFR: 64

## 2023-02-24 ENCOUNTER — Encounter (HOSPITAL_BASED_OUTPATIENT_CLINIC_OR_DEPARTMENT_OTHER): Payer: Self-pay | Admitting: Cardiovascular Disease

## 2023-02-24 ENCOUNTER — Ambulatory Visit (INDEPENDENT_AMBULATORY_CARE_PROVIDER_SITE_OTHER): Payer: Medicare Other | Admitting: Cardiovascular Disease

## 2023-02-24 VITALS — BP 124/66 | HR 63 | Ht 65.0 in | Wt 180.6 lb

## 2023-02-24 DIAGNOSIS — R079 Chest pain, unspecified: Secondary | ICD-10-CM | POA: Diagnosis not present

## 2023-02-24 DIAGNOSIS — E7849 Other hyperlipidemia: Secondary | ICD-10-CM | POA: Diagnosis not present

## 2023-02-24 DIAGNOSIS — R7303 Prediabetes: Secondary | ICD-10-CM | POA: Diagnosis not present

## 2023-02-24 DIAGNOSIS — I493 Ventricular premature depolarization: Secondary | ICD-10-CM | POA: Diagnosis not present

## 2023-02-24 HISTORY — DX: Chest pain, unspecified: R07.9

## 2023-02-24 HISTORY — DX: Ventricular premature depolarization: I49.3

## 2023-02-24 NOTE — Assessment & Plan Note (Signed)
Lipids are very well-controlled on pravastatin.  Increase exercise as above.

## 2023-02-24 NOTE — Assessment & Plan Note (Signed)
She had an episode of chest pain that occurred right when lying down after walking to the bathroom at night.  She has not had any exertional chest pain, though that she is not getting much exercise.  We will get an exercise Myoview to assess for ischemia.  Consider getting a coronary CTA.  However given the frequency of her PVCs, we are unlikely to get an unreliable test.

## 2023-02-24 NOTE — Assessment & Plan Note (Signed)
PVCs noted on her EKG today.  She was noted more frequent chest pricks.  She is unsure if this is her PVCs and PACs.  She has been under more stress lately from building a new home.  Will get an echo to make sure there is no evidence of any heart failure.  Otherwise, no plan to start any lasting medications, as she is also bradycardic at baseline.  Labs have been tested recently and been unremarkable.

## 2023-02-24 NOTE — Patient Instructions (Addendum)
Medication Instructions:  Your physician recommends that you continue on your current medications as directed. Please refer to the Current Medication list given to you today.   *If you need a refill on your cardiac medications before your next appointment, please call your pharmacy*  Lab Work: NONE  Testing/Procedures: Your physician has requested that you have an echocardiogram. Echocardiography is a painless test that uses sound waves to create images of your heart. It provides your doctor with information about the size and shape of your heart and how well your heart's chambers and valves are working. This procedure takes approximately one hour. There are no restrictions for this procedure. Please do NOT wear cologne, perfume, aftershave, or lotions (deodorant is allowed). Please arrive 15 minutes prior to your appointment time.  Your physician has requested that you have en exercise stress myoview. For further information please visit [email or web address] Please follow instruction sheet, as given.  Follow-Up: At [facility], you and your health needs are our priority.  As part of our continuing mission to provide you with exceptional heart care, we have created designated Provider Care Teams.  These Care Teams include your primary Cardiologist (physician) and Advanced Practice Providers (APPs -  Physician Assistants and Nurse Practitioners) who all work together to provide you with the care you need, when you need it.  We recommend signing up for the patient portal called "MyChart".  Sign up information is provided on this After Visit Summary.  MyChart is used to connect with patients for Virtual Visits (Telemedicine).  Patients are able to view lab/test results, encounter notes, upcoming appointments, etc.  Non-urgent messages can be sent to your provider as well.   To learn more about what you can do with MyChart, go to [email or web address].    Your next appointment:   3  month(s)  Provider:   Gillian Shields, NP   1 YEAR WITH DR [facility]    Your Physician has requested you have a Myocardial Perfusion Imaging Study.   Please arrive 15 minutes prior to your appointment time for registration and insurance purposes.   The test will take approximately 3 to 4 hours to complete; you may bring reading material. If someone comes with you to your appointment, they will need to remain in the main lobby due to limited testing space in the testing area.   How to prepare for your test:  - Do not eat or drink 3 hours prior to your test, except you may have water  - Do not consume products containing caffeine ( regular or decaf) 12 hours prior to your test ( coffee, chocolate, sodas or teas)  - Do bring a list of your current medications with you. If not listed below, you may take your medications as normal (Hold beta blocker- 24 hour prior for exercise myoview)   - Do wear comfortable clothes (no dresses or overalls) and walking shoes ( tennis shoes preferred), no heel or open toe shoes are allowed - Do not wear cologne, perfume, aftershave, or lotions ( deodorant is allowed)  - If these instructions are not followed, your test will be rescheduled   If you cannot keep your appointment, please provide 24 hours notice to the Nuclear [facility], to avoid a possible $50 charge to your account!

## 2023-02-24 NOTE — Assessment & Plan Note (Signed)
Hemoglobin A1c is at 5.8%.  Recommended that she work on limiting her carbohydrate intake and increase exercise.

## 2023-02-24 NOTE — Progress Notes (Signed)
Cardiology Office Note   Date:  02/24/2023   ID:  FORREST DEMURO, DOB [birth year], MRN [id number]  PCP:  Pincus Sanes, MD  Cardiologist:   Chilton Si, MD   No chief complaint on file.    History of Present Illness: Melissa Lowe is a 82 y.o. female with hyperlipidemia, hypothyroidism and asthma, here for chest pain. She was first seen in 2017 for palpitations. who presents for evaluation of an irregular heart rhythm. Melissa Lowe was seen by the speech and hearing clinic where her heart rhythm appeared to be irregular. She requested consultation to have this evaluated.    She wore a heart monitor that showed frequent PACs but no atrial fibrillation. She saw her PCP 01/2023 and notes an occasional sharp pain in her chest. Dr. Lawerance Bach recommended that she follow up with cardiology.   Today, she reports an episode of very sharp left sided chest pain on her breast line about 2 weeks ago. This occurred while getting back into bed after using the bathroom in the middle of the night. She denies any associated nausea and diaphoresis. She hasn't had any episodes since then. Her first episode was a couple of months prior. She monitors her blood pressures and brought a log with her today. Her readings tend to average in the 110-120s/60-80s. She stays active by walking when the weather is nice. She reports her house is still being built, which has been causing her some stress. She denies any palpitations, shortness of breath, or peripheral edema. No lightheadedness, headaches, syncope, orthopnea, or PND.  Past Medical History:  Diagnosis Date   Allergy    Asthma    Chest pain of uncertain etiology 02/24/2023   Diverticulosis    Fibroid    HSV infection    nose   Hyperlipidemia    Hypothyroidism    IBS (irritable bowel syndrome)    PAC (premature atrial contraction) 09/22/2016   PVC (premature ventricular contraction) 02/24/2023   Vertigo     Past Surgical History:  Procedure  Laterality Date   ABDOMINAL HYSTERECTOMY     2  fibroids   ANTERIOR AND POSTERIOR REPAIR N/A 01/30/2013   Procedure:  POSTERIOR REPAIR (RECTOCELE);  Surgeon: [name], MD;  Location: [facility];  Service: Gynecology;  Laterality: N/A;   arthoscopy Left 08/2018   L knee repair torn meniscus   CATARACT EXTRACTION     CHOLECYSTECTOMY     colon polypectomy  2001   Dr Kinnie Scales   COLONOSCOPY  12/25/13   Tics , Dr Horris Latino 2020   EYE SURGERY     KNEE SURGERY Left 2020   MENISCUS REPAIR   REPAIR FASCIAL DEFECT LEG  1972   SEPTOPLASTY     THIGH FASCIOTOMY       Current Outpatient Medications  Medication Sig Dispense Refill   albuterol (VENTOLIN HFA) 108 (90 Base) MCG/ACT inhaler USE 1 INHALATION BY MOUTH EVERY  6 HOURS AS NEEDED FOR WHEEZING  OR SHORTNESS OF BREATH 36 g 3   Ascorbic Acid (VITAMIN C PO) Take 500 mg by mouth daily.     bimatoprost (LATISSE) 0.03 % ophthalmic solution Place into both eyes. apply evenly along the skin of the upper eyelid at base of eyelashes 3xs weekly at bedtime; repeat procedure for second eye (use a clean applicator).     budesonide-formoterol (SYMBICORT) 80-4.5 MCG/ACT inhaler USE 1 INHALATION BY MOUTH  DAILY PER THE MANUFACTURER,  EXPIRES 3 MONTHS AFTER  OPENING OVERWRAP 10.2 g  3   ELDERBERRY PO Take by mouth. [place] Liquid.     famotidine (PEPCID) 20 MG tablet Take 40 mg by mouth daily.      levothyroxine (SYNTHROID) 50 MCG tablet TAKE 1 TABLET BY MOUTH IN  THE MORNING 90 tablet 3   montelukast (SINGULAIR) 10 MG tablet Take 1 tablet (10 mg total) by mouth daily. 90 tablet 3   pravastatin (PRAVACHOL) 40 MG tablet Take 1 tablet (40 mg total) by mouth at bedtime. 90 tablet 3   VITAMIN D PO Take 1,000 mg by mouth daily.     No current facility-administered medications for this visit.    Allergies:   Sulfonamide derivatives, Prednisone, and Tetracycline    Social History:  The patient  reports that she has never smoked. She has never used smokeless  tobacco. She reports current alcohol use. She reports that she does not use drugs.   Family History:  The patient's family history includes Arrhythmia in her sister; Asthma in her sister; Breast cancer in her sister; Cancer in her maternal grandfather; Cancer (age of onset: 52) in her father; Dementia in her mother; Stroke in her maternal grandmother; Stroke (age of onset: 46) in her paternal grandmother.    ROS:  Please see the history of present illness.    (+) chest pain (sharp) All other systems are reviewed and negative.    PHYSICAL EXAM: VS:  BP 124/66 (BP Location: Left Arm, Patient Position: Sitting, Cuff Size: Normal)   Pulse 63   Ht 5\' 5"  (1.651 m)   Wt 180 lb 9.6 oz (81.9 kg)   BMI 30.05 kg/m  , BMI Body mass index is 30.05 kg/m. GENERAL:  Well appearing HEENT:  Pupils equal round and reactive, fundi not visualized, oral mucosa unremarkable NECK:  No jugular venous distention, waveform within normal limits, carotid upstroke brisk and symmetric, no bruits, no thyromegaly LUNGS:  Clear to auscultation bilaterally.  Mild expiratory wheeze.  HEART:  Mostly regular with occasional ectopy.  PMI not displaced or sustained,S1 and S2 within normal limits, no S3, no S4, no clicks, no rubs, no murmurs ABD:  Flat, positive bowel sounds normal in frequency in pitch, no bruits, no rebound, no guarding, no midline pulsatile mass, no hepatomegaly, no splenomegaly EXT:  2 plus pulses throughout, no edema, no cyanosis no clubbing SKIN:  No rashes no nodules NEURO:  Cranial nerves II through XII grossly intact, motor grossly intact throughout PSYCH:  Cognitively intact, oriented to person place and time  EKG:  EKG is ordered today. 02/24/2023: Sinus rhythm. Rate 63 bpm. PVCs 09/21/2016: Sinus arrhythmia.  Rate 60 bpm.    Recent Labs: 02/11/2023: ALT 13; BUN 19; Creatinine, Ser 0.93; Hemoglobin 13.6; Platelets 276.0; Potassium 4.3; Sodium 140; TSH 2.03    Lipid Panel    Component Value  Date/Time   CHOL 153 02/11/2023 1206   CHOL 179 12/03/2014 1133   TRIG 101.0 02/11/2023 1206   TRIG 153 (H) 12/03/2014 1133   HDL 62.20 02/11/2023 1206   HDL 58 12/03/2014 1133   CHOLHDL 2 02/11/2023 1206   VLDL 20.2 02/11/2023 1206   LDLCALC 71 02/11/2023 1206   LDLCALC 101 (H) 07/09/2020 1052   LDLCALC 90 12/03/2014 1133   LDLDIRECT 115.0 07/05/2019 1523      Wt Readings from Last 3 Encounters:  02/24/23 180 lb 9.6 oz (81.9 kg)  02/11/23 179 lb (81.2 kg)  10/02/22 183 lb 3.2 oz (83.1 kg)      ASSESSMENT AND PLAN:  PVC (premature ventricular contraction) PVCs noted on her EKG today.  She was noted more frequent chest pricks.  She is unsure if this is her PVCs and PACs.  She has been under more stress lately from building a new home.  Will get an echo to make sure there is no evidence of any heart failure.  Otherwise, no plan to start any lasting medications, as she is also bradycardic at baseline.  Labs have been tested recently and been unremarkable.  Chest pain of uncertain etiology She had an episode of chest pain that occurred right when lying down after walking to the bathroom at night.  She has not had any exertional chest pain, though that she is not getting much exercise.  We will get an exercise Myoview to assess for ischemia.  Consider getting a coronary CTA.  However given the frequency of her PVCs, we are unlikely to get an unreliable test.  Prediabetes Hemoglobin A1c is at 5.8%.  Recommended that she work on limiting her carbohydrate intake and increase exercise.  Hyperlipidemia Lipids are very well-controlled on pravastatin.  Increase exercise as above.  Shared Decision Making/Informed Consent The risks [chest pain, shortness of breath, cardiac arrhythmias, dizziness, blood pressure fluctuations, myocardial infarction, stroke/transient ischemic attack, nausea, vomiting, allergic reaction, radiation exposure, metallic taste sensation and life-threatening  complications (estimated to be 1 in 10,000)], benefits (risk stratification, diagnosing coronary artery disease, treatment guidance) and alternatives of a nuclear stress test were discussed in detail with Melissa Lowe and she agrees to proceed.   Current medicines are reviewed at length with the patient today.  The patient does not have concerns regarding medicines.  The following changes have been made:  no change  Labs/ tests ordered today include:   Orders Placed This Encounter  Procedures   MYOCARDIAL PERFUSION IMAGING   EKG 12-Lead   ECHOCARDIOGRAM COMPLETE   Time spent: 50 minutes-Greater than 50% of this time was spent in counseling, explanation of diagnosis, planning of further management, and coordination of care.   Disposition:    FU with Banjamin Stovall C. Duke Salvia, MD, [facility] in 1 year FU with Gillian Shields in 2-3 months    I,Rachel Rivera,acting as a scribe for Chilton Si, MD.,have documented all relevant documentation on the behalf of Chilton Si, MD,as directed by  Chilton Si, MD while in the presence of Chilton Si, MD.  I, Lina Hitch C. Duke Salvia, MD have reviewed all documentation for this visit.  The documentation of the exam, diagnosis, procedures, and orders on 02/24/2023 are all accurate and complete.   Signed, Jaquavion Mccannon C. Duke Salvia, MD, [facility]  02/24/2023 12:25 PM    [facility]

## 2023-03-12 ENCOUNTER — Ambulatory Visit: Payer: Medicare Other | Admitting: Obstetrics & Gynecology

## 2023-03-12 ENCOUNTER — Encounter: Payer: Self-pay | Admitting: Obstetrics & Gynecology

## 2023-03-12 VITALS — BP 114/72 | HR 72

## 2023-03-12 DIAGNOSIS — Z4689 Encounter for fitting and adjustment of other specified devices: Secondary | ICD-10-CM | POA: Diagnosis not present

## 2023-03-12 NOTE — Progress Notes (Signed)
    Melissa Lowe 12-07-1940 [id number]        82 y.o.  G0 Married.  Building a new house at [employer].   RP:  Pessary maintenance   HPI:  Well with pessary Milex ring #1 with support for a small Cystocele.  No pelvic pain. No discharge. Had very light spotting x 1 last week.  No SUI.  Abstinent. Postmenopause, well on no HRT. S/P TAH for Fibroids.     OB History  Gravida Para Term Preterm AB Living  0 0 0 0 0 0  SAB IAB Ectopic Multiple Live Births  0 0 0 0 0    Past medical history,surgical history, problem list, medications, allergies, family history and social history were all reviewed and documented in the EPIC chart.   Directed ROS with pertinent positives and negatives documented in the history of present illness/assessment and plan.  Exam:  Vitals:   03/12/23 1329  BP: 114/72  Pulse: 72  SpO2: 96%   General appearance:  Normal  Gynecologic exam: Vulva normal.  Pessary removed and cleaned.  No discharge or blood. Vaginal mucosa intact.  Exam in standing up position with Valsalva: Minimal Cystocele, mild urethrocele.  Pessary put back in place in the vagina.     Assessment/Plan:  82 y.o. G0P0000    1. Pessary maintenance Well with pessary Milex ring #1 with support for a small Cystocele.  No pelvic pain. No discharge.  Abstinent. Postmenopause, well on no HRT.  S/P TAH for Fibroids. Intact vaginal mucosa.  Pessary put back in place in the vagina. Would like to have surgical repair to stop using the pessary.  Will refer to Dr Melissa Lowe per patient's decision.  Patient attempted not to use the pessary in the past and felt a bulge that was uncomfortable when going to the bathroom.   2. Hx of total hysterectomy    Melissa Del MD, 1:40 PM 03/12/2023

## 2023-03-29 ENCOUNTER — Encounter (HOSPITAL_COMMUNITY): Payer: Medicare Other

## 2023-03-29 ENCOUNTER — Ambulatory Visit (HOSPITAL_COMMUNITY): Payer: Medicare Other

## 2023-04-01 ENCOUNTER — Telehealth (HOSPITAL_COMMUNITY): Payer: Self-pay | Admitting: *Deleted

## 2023-04-01 NOTE — Telephone Encounter (Signed)
Left detailed message on home vm with instructions for MPI.

## 2023-04-08 ENCOUNTER — Ambulatory Visit (HOSPITAL_COMMUNITY): Payer: Medicare Other

## 2023-04-16 ENCOUNTER — Ambulatory Visit (HOSPITAL_COMMUNITY): Payer: Medicare Other | Attending: Cardiovascular Disease

## 2023-04-16 DIAGNOSIS — R7303 Prediabetes: Secondary | ICD-10-CM | POA: Insufficient documentation

## 2023-04-16 DIAGNOSIS — R079 Chest pain, unspecified: Secondary | ICD-10-CM | POA: Diagnosis not present

## 2023-04-16 DIAGNOSIS — I493 Ventricular premature depolarization: Secondary | ICD-10-CM | POA: Insufficient documentation

## 2023-04-16 DIAGNOSIS — E7849 Other hyperlipidemia: Secondary | ICD-10-CM | POA: Insufficient documentation

## 2023-04-16 LAB — ECHOCARDIOGRAM COMPLETE
Area-P 1/2: 2.76 cm2
S' Lateral: 2.7 cm

## 2023-04-28 ENCOUNTER — Telehealth (HOSPITAL_COMMUNITY): Payer: Self-pay

## 2023-04-28 ENCOUNTER — Telehealth (HOSPITAL_COMMUNITY): Payer: Self-pay | Admitting: *Deleted

## 2023-04-28 NOTE — Telephone Encounter (Signed)
Pt called in for instruction clarification. She stated that she would be here for her test. S.Raphel Stickles EMTP/CCT

## 2023-04-28 NOTE — Telephone Encounter (Signed)
Left detailed instructions for MPI study on pt's mobile number per DPR.

## 2023-05-03 ENCOUNTER — Encounter (HOSPITAL_BASED_OUTPATIENT_CLINIC_OR_DEPARTMENT_OTHER): Payer: Self-pay | Admitting: Cardiovascular Disease

## 2023-05-03 ENCOUNTER — Other Ambulatory Visit (HOSPITAL_BASED_OUTPATIENT_CLINIC_OR_DEPARTMENT_OTHER): Payer: Self-pay | Admitting: *Deleted

## 2023-05-03 DIAGNOSIS — I77810 Thoracic aortic ectasia: Secondary | ICD-10-CM

## 2023-05-04 ENCOUNTER — Encounter: Payer: Self-pay | Admitting: Obstetrics & Gynecology

## 2023-05-05 ENCOUNTER — Telehealth (HOSPITAL_COMMUNITY): Payer: Self-pay | Admitting: Cardiovascular Disease

## 2023-05-05 NOTE — Telephone Encounter (Signed)
Patient cancelled scheduled Myoview for reason below:  05/04/2023 2:12 PM ZO:XWRUE, SHAWANA A  Cancel Rsn: Patient (wants to talk to her pcp first will c/b to r/s)   Order will be removed from the active NUC WQ. If patient calls back to reschedule we will reinstate the order. Thank you.

## 2023-05-06 ENCOUNTER — Ambulatory Visit (HOSPITAL_COMMUNITY): Payer: Medicare Other

## 2023-05-28 ENCOUNTER — Encounter (HOSPITAL_BASED_OUTPATIENT_CLINIC_OR_DEPARTMENT_OTHER): Payer: Self-pay | Admitting: Family

## 2023-05-28 ENCOUNTER — Ambulatory Visit (INDEPENDENT_AMBULATORY_CARE_PROVIDER_SITE_OTHER): Payer: Medicare Other | Admitting: Family

## 2023-05-28 VITALS — BP 118/72 | HR 64 | Ht 65.0 in | Wt 177.0 lb

## 2023-05-28 DIAGNOSIS — R079 Chest pain, unspecified: Secondary | ICD-10-CM

## 2023-05-28 DIAGNOSIS — I77810 Thoracic aortic ectasia: Secondary | ICD-10-CM | POA: Diagnosis not present

## 2023-05-28 DIAGNOSIS — I493 Ventricular premature depolarization: Secondary | ICD-10-CM | POA: Diagnosis not present

## 2023-05-28 DIAGNOSIS — E782 Mixed hyperlipidemia: Secondary | ICD-10-CM

## 2023-05-28 NOTE — Patient Instructions (Signed)
Medication Instructions:  Your physician recommends that you continue on your current medications as directed. Please refer to the Current Medication list given to you today.  *If you need a refill on your cardiac medications before your next appointment, please call your pharmacy*  Testing/Procedures: Your physician has recommend you to have a coronary calcium score. This is a self pay test that will cost $99  Follow-Up: At [facility], you and your health needs are our priority.  As part of our continuing mission to provide you with exceptional heart care, we have created designated Provider Care Teams.  These Care Teams include your primary Cardiologist (physician) and Advanced Practice Providers (APPs -  Physician Assistants and Nurse Practitioners) who all work together to provide you with the care you need, when you need it.  We recommend signing up for the patient portal called "MyChart".  Sign up information is provided on this After Visit Summary.  MyChart is used to connect with patients for Virtual Visits (Telemedicine).  Patients are able to view lab/test results, encounter notes, upcoming appointments, etc.  Non-urgent messages can be sent to your provider as well.   To learn more about what you can do with MyChart, go to [email or web address].    Your next appointment:   April 2025 or sooner depending on results of testing!

## 2023-05-28 NOTE — Progress Notes (Unsigned)
Cardiology Office Note:  .   Date:  06/03/2023  ID:  Melissa Lowe, DOB 05-Dec-1940, MRN [id number] PCP: Pincus Sanes, MD  [facility] Providers Cardiologist:  Chilton Si, MD    History of Present Illness: .   RELDA Lowe is a 82 y.o. female with history of HLD, hypothyroidism, asthma, PAC, PVC  Prior monitor with frequent PACs but no atrial fibrillation.  Seen by Dr. Duke Salvia 02/24/2023 with episode of sharp chest discomfort.  EKG NSR with PVC.  Echo ordered and performed 05/03/2023 normal LVEF, grade 1 diastolic dysfunction, mild MR/AI, borderline dilation of aortic root..  Myoview ordered but not performed.  Presents today for follow up.  Since last seen has moved from 2 houses into 1. She had concerned about medication given during stress test. She reports a mid-sternal chest discomfort described as "throbbing, solid" pain. Occurs with activity. However, during recnet moving activity has only had one short episode which self resolved. Notes episodes are overall infrequent. Episodes last <1 minute. Reports no shortness of breath nor dyspnea on exertion. No edema, orthopnea, PND. Reports no palpitations.      ROS: Please see the history of present illness.    All other systems reviewed and are negative.   Studies Reviewed: .        Cardiac Studies & Procedures       ECHOCARDIOGRAM  ECHOCARDIOGRAM COMPLETE 04/16/2023  Narrative ECHOCARDIOGRAM REPORT    Patient Name:   Melissa Lowe Date of Exam: 04/16/2023 Medical Rec #:  [id number]         Height:       65.0 in Accession #:    [id number]        Weight:       180.6 lb Date of Birth:  1941-05-31         BSA:          1.894 m Patient Age:    84 years          BP:           114/72 mmHg Patient Gender: F                 HR:           69 bpm. Exam Location:  [address]  Procedure: 2D Echo, Color Doppler, Cardiac Doppler, 3D Echo and Strain Analysis  Indications:    Chest Pain R07.9  History:         Patient has no prior history of Echocardiogram examinations. Arrythmias:PAC and PVC; Risk Factors:Dyslipidemia.  Sonographer:    Thurman Coyer RDCS Referring Phys: [id number] TIFFANY [place]  IMPRESSIONS   1. Left ventricular ejection fraction, by estimation, is 60 to 65%. Left ventricular ejection fraction by 3D volume is 60 %. The left ventricle has normal function. The left ventricle has no regional wall motion abnormalities. Left ventricular diastolic parameters are consistent with Grade I diastolic dysfunction (impaired relaxation). The average left ventricular global longitudinal strain is -22.5 %. The global longitudinal strain is normal. 2. Right ventricular systolic function is normal. The right ventricular size is normal. There is normal pulmonary artery systolic pressure. The estimated right ventricular systolic pressure is 25.1 mmHg. 3. The mitral valve is normal in structure. Mild mitral valve regurgitation. 4. The aortic valve is tricuspid. Aortic valve regurgitation is mild. 5. Aortic dilatation noted. There is borderline dilatation of the aortic root, measuring 39 mm.  FINDINGS Left Ventricle: Left ventricular ejection fraction, by estimation, is 60  to 65%. Left ventricular ejection fraction by 3D volume is 60 %. The left ventricle has normal function. The left ventricle has no regional wall motion abnormalities. The average left ventricular global longitudinal strain is -22.5 %. The global longitudinal strain is normal. The left ventricular internal cavity size was normal in size. There is no left ventricular hypertrophy. Left ventricular diastolic parameters are consistent with Grade I diastolic dysfunction (impaired relaxation). Normal left ventricular filling pressure.  Right Ventricle: The right ventricular size is normal. No increase in right ventricular wall thickness. Right ventricular systolic function is normal. There is normal pulmonary artery systolic pressure. The  tricuspid regurgitant velocity is 2.07 m/s, and with an assumed right atrial pressure of 8 mmHg, the estimated right ventricular systolic pressure is 25.1 mmHg.  Left Atrium: Left atrial size was normal in size.  Right Atrium: Right atrial size was normal in size. Prominent Eustachian valve.  Pericardium: There is no evidence of pericardial effusion.  Mitral Valve: The mitral valve is normal in structure. Mild mitral valve regurgitation.  Tricuspid Valve: The tricuspid valve is normal in structure. Tricuspid valve regurgitation is trivial.  Aortic Valve: The aortic valve is tricuspid. Aortic valve regurgitation is mild.  Pulmonic Valve: The pulmonic valve was grossly normal. Pulmonic valve regurgitation is not visualized.  Aorta: Aortic dilatation noted. There is borderline dilatation of the aortic root, measuring 39 mm.  IAS/Shunts: No atrial level shunt detected by color flow Doppler.   LEFT VENTRICLE PLAX 2D LVIDd:         4.10 cm         Diastology LVIDs:         2.70 cm         LV e' medial:    8.27 cm/s LV PW:         0.80 cm         LV E/e' medial:  6.8 LV IVS:        0.70 cm         LV e' lateral:   12.20 cm/s LVOT diam:     2.20 cm         LV E/e' lateral: 4.6 LV SV:         92 LV SV Index:   48              2D LVOT Area:     3.80 cm        Longitudinal Strain 2D Strain GLS  -22.5 % Avg:  3D Volume EF LV 3D EF:    Left ventricul ar ejection fraction by 3D volume is 60 %.  3D Volume EF: 3D EF:        60 % LV EDV:       95 ml LV ESV:       38 ml LV SV:        57 ml  RIGHT VENTRICLE RV Basal diam:  3.50 cm RV Mid diam:    3.10 cm RV S prime:     16.90 cm/s TAPSE (M-mode): 2.3 cm  LEFT ATRIUM             Index        RIGHT ATRIUM           Index LA diam:        2.60 cm 1.37 cm/m   RA Area:     16.00 cm LA Vol (A2C):   44.5 ml 23.49 ml/m  RA Volume:   41.80 ml  22.07 ml/m LA Vol (A4C):   35.4 ml 18.69 ml/m LA Biplane Vol: 41.3 ml 21.80  ml/m AORTIC VALVE LVOT Vmax:   122.00 cm/s LVOT Vmean:  73.900 cm/s LVOT VTI:    0.241 m  AORTA Ao Root diam: 3.90 cm Ao Asc diam:  3.50 cm  MITRAL VALVE               TRICUSPID VALVE MV Area (PHT): 2.76 cm    TR Peak grad:   17.1 mmHg MV Decel Time: 275 msec    TR Vmax:        207.00 cm/s MV E velocity: 56.60 cm/s MV A velocity: 84.40 cm/s  SHUNTS MV E/A ratio:  0.67        Systemic VTI:  0.24 m Systemic Diam: 2.20 cm  Rachelle Hora Croitoru MD Electronically signed by Thurmon Fair MD Signature Date/Time: 04/16/2023/3:31:50 PM    Final    MONITORS  CARDIAC EVENT MONITOR 10/05/2016  Narrative 7 Day Event Monitor  Quality: Fair.  Baseline artifact. Predominant rhythm: sinus rhythm Average heart rate: 79 bpm  Frequent PVCs and occasional PACs.  Tiffany C. Duke Salvia, MD, [facility] 10/20/2016 2:42 PM           Risk Assessment/Calculations:            Physical Exam:   VS:  BP 118/72   Pulse 64   Ht 5\' 5"  (1.651 m)   Wt 177 lb (80.3 kg)   BMI 29.45 kg/m    Wt Readings from Last 3 Encounters:  05/28/23 177 lb (80.3 kg)  02/24/23 180 lb 9.6 oz (81.9 kg)  02/11/23 179 lb (81.2 kg)    GEN: Well nourished, well developed in no acute distress NECK: No JVD; No carotid bruits CARDIAC: RRR, no murmurs, rubs, gallops RESPIRATORY:  Clear to auscultation without rales, wheezing or rhonchi  ABDOMEN: Soft, non-tender, non-distended EXTREMITIES:  No edema; No deformity   ASSESSMENT AND PLAN: .    Chest pain - Atypical for angina. Hesitant about meds for procedures (I.e. lexiscan myoview or nitroglycerin prior to CTA) plan for coronary calcium score.    PVC / PAC - Asymptomatic. Defer AV nodal blocking agent due to baseline bradycardia.   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 Repeat echo in 1 year. Continue optimal BP control.   HLD - Controlled on Pravastatin.   Prediabetes - Continue to follow with PCP. Recommend aiming for 150 minutes of moderate intensity  activity per week and following a heart healthy diet.         Dispo: follow up April 2025 unless coronary calcium score concerning  Signed, Alver Sorrow, NP

## 2023-06-03 ENCOUNTER — Encounter (HOSPITAL_BASED_OUTPATIENT_CLINIC_OR_DEPARTMENT_OTHER): Payer: Self-pay | Admitting: Family

## 2023-06-07 ENCOUNTER — Other Ambulatory Visit: Payer: Self-pay | Admitting: Internal Medicine

## 2023-06-07 DIAGNOSIS — Z1231 Encounter for screening mammogram for malignant neoplasm of breast: Secondary | ICD-10-CM

## 2023-06-28 ENCOUNTER — Other Ambulatory Visit: Payer: Self-pay | Admitting: Internal Medicine

## 2023-06-28 DIAGNOSIS — E038 Other specified hypothyroidism: Secondary | ICD-10-CM

## 2023-06-29 ENCOUNTER — Other Ambulatory Visit (HOSPITAL_BASED_OUTPATIENT_CLINIC_OR_DEPARTMENT_OTHER): Payer: Self-pay

## 2023-06-29 MED ORDER — FLUAD 0.5 ML IM SUSY
PREFILLED_SYRINGE | INTRAMUSCULAR | 0 refills | Status: DC
  Filled 2023-06-29: qty 0.5, 1d supply, fill #0

## 2023-07-06 ENCOUNTER — Ambulatory Visit
Admission: RE | Admit: 2023-07-06 | Discharge: 2023-07-06 | Disposition: A | Discharge status: Home / Self Care | Payer: Medicare Other | Source: Ambulatory Visit | Attending: Internal Medicine

## 2023-07-06 DIAGNOSIS — Z1231 Encounter for screening mammogram for malignant neoplasm of breast: Secondary | ICD-10-CM

## 2023-07-08 ENCOUNTER — Telehealth: Payer: Self-pay

## 2023-07-08 NOTE — Telephone Encounter (Signed)
Spoke with Denny Peon today from [employer].  Companies changing manufactures but dose and everything else will remain the same.  Verbal given for change.

## 2023-07-09 ENCOUNTER — Other Ambulatory Visit (HOSPITAL_BASED_OUTPATIENT_CLINIC_OR_DEPARTMENT_OTHER): Payer: Self-pay

## 2023-07-09 MED ORDER — COMIRNATY 30 MCG/0.3ML IM SUSY
0.3000 mL | PREFILLED_SYRINGE | Freq: Once | INTRAMUSCULAR | 0 refills | Status: AC
  Filled 2023-07-09: qty 0.3, 1d supply, fill #0

## 2023-07-15 ENCOUNTER — Other Ambulatory Visit (HOSPITAL_BASED_OUTPATIENT_CLINIC_OR_DEPARTMENT_OTHER): Payer: Self-pay | Admitting: *Deleted

## 2023-07-15 ENCOUNTER — Encounter: Payer: Self-pay | Admitting: Internal Medicine

## 2023-07-15 ENCOUNTER — Ambulatory Visit (HOSPITAL_BASED_OUTPATIENT_CLINIC_OR_DEPARTMENT_OTHER)
Admission: RE | Admit: 2023-07-15 | Discharge: 2023-07-15 | Disposition: A | Discharge status: Home / Self Care | Payer: Medicare Other | Source: Ambulatory Visit | Attending: Family | Admitting: Family

## 2023-07-15 DIAGNOSIS — R079 Chest pain, unspecified: Secondary | ICD-10-CM | POA: Insufficient documentation

## 2023-07-15 MED ORDER — ASPIRIN 81 MG PO TBEC: 81.0000 mg | DELAYED_RELEASE_TABLET | Freq: Every day | ORAL | Status: DC

## 2023-07-15 NOTE — Telephone Encounter (Signed)
Pt LVM in triage line inquiring about referral. Stating that since this msg, she has not heard anything from our office or the urogyn office as far as an appt w/ Dr. Florian Buff. Can you f/u on this at your earliest convenience. Thank you!Marland Kitchen

## 2023-07-16 NOTE — Telephone Encounter (Signed)
Called patient and gave info for Dr. Florian Buff.

## 2023-07-18 MED ORDER — AZITHROMYCIN 500 MG PO TABS: 500.0000 mg | ORAL_TABLET | Freq: Every day | ORAL | 0 refills | Status: DC

## 2023-07-18 MED ORDER — ZOLPIDEM TARTRATE 10 MG PO TABS: 5.0000 mg | ORAL_TABLET | Freq: Every evening | ORAL | 0 refills | Status: DC | PRN

## 2023-07-18 NOTE — Addendum Note (Signed)
Addended by: Pincus Sanes on: 07/18/2023 03:09 PM   Modules accepted: Orders

## 2023-08-13 ENCOUNTER — Ambulatory Visit: Payer: Medicare Other | Admitting: Internal Medicine

## 2023-08-16 NOTE — Progress Notes (Unsigned)
Subjective:    Patient ID: Melissa Lowe, female    DOB: 09-Aug-1941, 82 y.o.   MRN: [id number]     HPI Melissa Lowe is here for follow up of her chronic medical problems.  Unexplained pain in central chest - intermittent - saw cardio -had calcium score   Spine and scoliosis - back issues - had radiofreq ablation - did ont help   When takes a deep breath - not breathing as deeply as she used to - for a while.    Nocturia x 2  Burning pain down right lateral leg -- bursitits of trochanter or right hip.  Knees achy- using voltaren gel -- will try arthur rest  Medications and allergies reviewed with patient and updated if appropriate.  Current Outpatient Medications on File Prior to Visit  Medication Sig Dispense Refill   albuterol (VENTOLIN HFA) 108 (90 Base) MCG/ACT inhaler USE 1 INHALATION BY MOUTH EVERY  6 HOURS AS NEEDED FOR WHEEZING  OR SHORTNESS OF BREATH 36 g 3   bimatoprost (LATISSE) 0.03 % ophthalmic solution Place into both eyes. apply evenly along the skin of the upper eyelid at base of eyelashes 3xs weekly at bedtime; repeat procedure for second eye (use a clean applicator).     budesonide-formoterol (SYMBICORT) 80-4.5 MCG/ACT inhaler USE 1 INHALATION BY MOUTH  DAILY PER THE MANUFACTURER,  EXPIRES 3 MONTHS AFTER  OPENING OVERWRAP 10.2 g 3   diclofenac Sodium (VOLTAREN) 1 % GEL Apply topically 4 (four) times daily.     ELDERBERRY PO Take by mouth. Bucoli Liquid.     famotidine (PEPCID) 20 MG tablet Take 40 mg by mouth daily.      fluticasone (FLONASE) 50 MCG/ACT nasal spray 1-2 sprays in the left nostril daily     No current facility-administered medications on file prior to visit.     Review of Systems  Constitutional:  Negative for fever.  Respiratory:  Positive for wheezing (rare). Negative for cough and shortness of breath.        Not able to take as deep of a breath  Cardiovascular:  Positive for chest pain (intermittent -). Negative for palpitations and  leg swelling.  Musculoskeletal:  Positive for back pain.       Right trochanteric bursitis  Neurological:  Positive for headaches (stress related). Negative for light-headedness.       Objective:   Vitals:   08/17/23 1002 08/17/23 1127  BP: 106/70 120/74  Pulse: (!) 56   Temp: 97.9 F (36.6 C)   SpO2: 98%    BP Readings from Last 3 Encounters:  08/17/23 120/74  05/28/23 118/72  03/12/23 114/72   Wt Readings from Last 3 Encounters:  08/17/23 177 lb (80.3 kg)  05/28/23 177 lb (80.3 kg)  02/24/23 180 lb 9.6 oz (81.9 kg)   Body mass index is 29.45 kg/m.    Physical Exam Constitutional:      General: She is not in acute distress.    Appearance: Normal appearance.  HENT:     Head: Normocephalic and atraumatic.  Eyes:     Conjunctiva/sclera: Conjunctivae normal.  Cardiovascular:     Rate and Rhythm: Normal rate and regular rhythm.     Heart sounds: Normal heart sounds.  Pulmonary:     Effort: Pulmonary effort is normal. No respiratory distress.     Breath sounds: Normal breath sounds. No wheezing.  Musculoskeletal:     Cervical back: Neck supple.     Right lower leg:  No edema.     Left lower leg: No edema.  Lymphadenopathy:     Cervical: No cervical adenopathy.  Skin:    General: Skin is warm and dry.     Findings: No rash.  Neurological:     Mental Status: She is alert. Mental status is at baseline.  Psychiatric:        Mood and Affect: Mood normal.        Behavior: Behavior normal.        Lab Results  Component Value Date   WBC 6.3 02/11/2023   HGB 13.6 02/11/2023   HCT 40.6 02/11/2023   PLT 276.0 02/11/2023   GLUCOSE 91 02/11/2023   CHOL 153 02/11/2023   TRIG 101.0 02/11/2023   HDL 62.20 02/11/2023   LDLDIRECT 115.0 07/05/2019   LDLCALC 71 02/11/2023   ALT 13 02/11/2023   AST 15 02/11/2023   NA 140 02/11/2023   K 4.3 02/11/2023   CL 105 02/11/2023   CREATININE 0.93 02/11/2023   BUN 19 02/11/2023   CO2 27 02/11/2023   TSH 2.03 02/11/2023    HGBA1C 5.8 02/11/2023   MICROALBUR 0.7 01/21/2021     Assessment & Plan:    See Problem List for Assessment and Plan of chronic medical problems.

## 2023-08-16 NOTE — Patient Instructions (Addendum)
      Blood work was ordered.   The lab is on the first floor.    Medications changes include :   none      Return in about 6 months (around 02/15/2024) for Physical Exam.

## 2023-08-17 ENCOUNTER — Ambulatory Visit: Payer: Medicare Other | Admitting: Internal Medicine

## 2023-08-17 VITALS — BP 120/74 | HR 56 | Temp 97.9°F | Ht 65.0 in | Wt 177.0 lb

## 2023-08-17 DIAGNOSIS — N182 Chronic kidney disease, stage 2 (mild): Secondary | ICD-10-CM | POA: Diagnosis not present

## 2023-08-17 DIAGNOSIS — J45909 Unspecified asthma, uncomplicated: Secondary | ICD-10-CM | POA: Diagnosis not present

## 2023-08-17 DIAGNOSIS — R7303 Prediabetes: Secondary | ICD-10-CM | POA: Diagnosis not present

## 2023-08-17 DIAGNOSIS — I340 Nonrheumatic mitral (valve) insufficiency: Secondary | ICD-10-CM | POA: Insufficient documentation

## 2023-08-17 DIAGNOSIS — G250 Essential tremor: Secondary | ICD-10-CM | POA: Diagnosis not present

## 2023-08-17 DIAGNOSIS — E038 Other specified hypothyroidism: Secondary | ICD-10-CM

## 2023-08-17 DIAGNOSIS — I351 Nonrheumatic aortic (valve) insufficiency: Secondary | ICD-10-CM | POA: Insufficient documentation

## 2023-08-17 DIAGNOSIS — I77810 Thoracic aortic ectasia: Secondary | ICD-10-CM | POA: Insufficient documentation

## 2023-08-17 DIAGNOSIS — E7849 Other hyperlipidemia: Secondary | ICD-10-CM | POA: Diagnosis not present

## 2023-08-17 DIAGNOSIS — R079 Chest pain, unspecified: Secondary | ICD-10-CM

## 2023-08-17 LAB — COMPREHENSIVE METABOLIC PANEL
ALT: 13 U/L (ref 0–35)
AST: 15 U/L (ref 0–37)
Albumin: 4.3 g/dL (ref 3.5–5.2)
Alkaline Phosphatase: 48 U/L (ref 39–117)
BUN: 10 mg/dL (ref 6–23)
CO2: 28 meq/L (ref 19–32)
Calcium: 10.1 mg/dL (ref 8.4–10.5)
Chloride: 105 meq/L (ref 96–112)
Creatinine, Ser: 0.92 mg/dL (ref 0.40–1.20)
GFR: 58.92 mL/min — ABNORMAL LOW (ref >60.00)
Glucose, Bld: 93 mg/dL (ref 70–99)
Potassium: 4.5 meq/L (ref 3.5–5.1)
Sodium: 141 meq/L (ref 135–145)
Total Bilirubin: 0.5 mg/dL (ref 0.2–1.2)
Total Protein: 6.9 g/dL (ref 6.0–8.3)

## 2023-08-17 LAB — LIPID PANEL
Cholesterol: 169 mg/dL (ref 0–200)
HDL: 65.8 mg/dL (ref >39.00)
LDL Cholesterol: 82 mg/dL (ref 0–99)
NonHDL: 103.33
Total CHOL/HDL Ratio: 3
Triglycerides: 108 mg/dL (ref 0.0–149.0)
VLDL: 21.6 mg/dL (ref 0.0–40.0)

## 2023-08-17 LAB — TSH: TSH: 3.24 u[IU]/mL (ref 0.35–5.50)

## 2023-08-17 LAB — HEMOGLOBIN A1C: Hgb A1c MFr Bld: 5.8 % (ref 4.6–6.5)

## 2023-08-17 MED ORDER — LEVOTHYROXINE SODIUM 50 MCG PO TABS: ORAL_TABLET | ORAL | 3 refills | Status: DC

## 2023-08-17 MED ORDER — MONTELUKAST SODIUM 10 MG PO TABS: 10.0000 mg | ORAL_TABLET | Freq: Every day | ORAL | 3 refills | Status: DC

## 2023-08-17 MED ORDER — PRAVASTATIN SODIUM 40 MG PO TABS: 40.0000 mg | ORAL_TABLET | Freq: Every day | ORAL | 3 refills | Status: DC

## 2023-08-17 NOTE — Assessment & Plan Note (Signed)
Chronic Overall well controlled Continue Symbicort 80-4.5 mcg per ACT twice daily Continue albuterol inhaler as needed Continue montelukast 10 mg nightly

## 2023-08-17 NOTE — Assessment & Plan Note (Signed)
Chronic  Clinically euthyroid Check tsh and will titrate med dose if needed Currently taking levothyroxine 50 mcg daily 

## 2023-08-17 NOTE — Assessment & Plan Note (Signed)
Chronic ?Stable ?Would like to avoid medication ?Has seen neurology ?

## 2023-08-17 NOTE — Assessment & Plan Note (Signed)
Chronic Regular exercise and healthy diet encouraged Check lipid panel, CMP Continue pravastatin 40 mg daily

## 2023-08-17 NOTE — Assessment & Plan Note (Signed)
Chronic Lab Results  Component Value Date   HGBA1C 5.8 02/11/2023   Check a1c Low sugar / carb diet Stressed regular exercise

## 2023-08-17 NOTE — Assessment & Plan Note (Signed)
Cardiac workup negative for chest pain ?  Esophageal spasms

## 2023-08-17 NOTE — Assessment & Plan Note (Signed)
Chronic Has seen nephrology-no obvious cause for CKD Mild Stable CMP

## 2023-09-06 ENCOUNTER — Other Ambulatory Visit: Payer: Self-pay | Admitting: Internal Medicine

## 2023-09-06 DIAGNOSIS — J45909 Unspecified asthma, uncomplicated: Secondary | ICD-10-CM

## 2023-09-08 ENCOUNTER — Encounter (INDEPENDENT_AMBULATORY_CARE_PROVIDER_SITE_OTHER): Payer: Self-pay

## 2023-09-08 ENCOUNTER — Ambulatory Visit (INDEPENDENT_AMBULATORY_CARE_PROVIDER_SITE_OTHER): Payer: Medicare Other | Admitting: Otolaryngology

## 2023-09-08 VITALS — Ht 60.0 in | Wt 174.0 lb

## 2023-09-08 DIAGNOSIS — H9313 Tinnitus, bilateral: Secondary | ICD-10-CM | POA: Diagnosis not present

## 2023-09-08 DIAGNOSIS — J310 Chronic rhinitis: Secondary | ICD-10-CM | POA: Diagnosis not present

## 2023-09-08 DIAGNOSIS — R0981 Nasal congestion: Secondary | ICD-10-CM | POA: Diagnosis not present

## 2023-09-08 DIAGNOSIS — H6990 Unspecified Eustachian tube disorder, unspecified ear: Secondary | ICD-10-CM

## 2023-09-08 DIAGNOSIS — H903 Sensorineural hearing loss, bilateral: Secondary | ICD-10-CM | POA: Diagnosis not present

## 2023-09-10 ENCOUNTER — Ambulatory Visit (INDEPENDENT_AMBULATORY_CARE_PROVIDER_SITE_OTHER): Payer: Medicare Other

## 2023-09-12 DIAGNOSIS — H903 Sensorineural hearing loss, bilateral: Secondary | ICD-10-CM | POA: Insufficient documentation

## 2023-09-12 DIAGNOSIS — J310 Chronic rhinitis: Secondary | ICD-10-CM | POA: Insufficient documentation

## 2023-09-12 NOTE — Progress Notes (Signed)
Patient ID: ALFREDA FENTON, female   DOB: 02-28-1941, 82 y.o.   MRN: [id number]  Follow-up: Eustachian tube dysfunction, hearing loss, tinnitus  HPI: The patient is an 82 year old female who returns today for her follow-up evaluation.  She was last seen 4 months ago.  She was noted to have bilateral eustachian tube dysfunction, bilateral high-frequency sensorineural hearing loss, and bilateral tinnitus.  She was treated with Flonase nasal spray and Valsalva exercise.  The patient returns today reporting improvement in her symptoms. She still has occasional tinnitus in her ears.  She denies any significant change in her hearing.  Her nasal and ear congestion has improved.  Currently she denies any otalgia, otorrhea, facial pain, or fever.  Exam: General: Communicates without difficulty, well nourished, no acute distress. Head: Normocephalic, no evidence injury, no tenderness, facial buttresses intact without stepoff. Face/sinus: No tenderness to palpation and percussion. Facial movement is normal and symmetric. Eyes: PERRL, EOMI. No scleral icterus, conjunctivae clear. Neuro: CN II exam reveals vision grossly intact.  No nystagmus at any point of gaze. Ears: Auricles well formed without lesions.  Ear canals are intact without mass or lesion.  No erythema or edema is appreciated.  The TMs are intact without fluid. Nose: External evaluation reveals normal support and skin without lesions.  Dorsum is intact.  Anterior rhinoscopy reveals congested mucosa over anterior aspect of inferior turbinates and intact septum.  No purulence noted. Oral:  Oral cavity and oropharynx are intact, symmetric, without erythema or edema.  Mucosa is moist without lesions. Neck: Full range of motion without pain.  There is no significant lymphadenopathy.  No masses palpable.  Thyroid bed within normal limits to palpation.  Parotid glands and submandibular glands equal bilaterally without mass.  Trachea is midline. Neuro:  CN 2-12  grossly intact.   Assessment: 1.  Chronic rhinitis with nasal mucosal congestion and eustachian tube dysfunction. 2.  Her ear canals, tympanic membranes, and middle ear spaces are noted to be normal. 3.  Subjectively stable bilateral high-frequency sensorineural hearing loss. 4.  Her tinnitus is likely a direct result of the hearing loss.  Plan: 1.  The physical exam findings are reviewed with the patient. 2.  Continue with Flonase nasal spray daily. 3.  Nasal saline irrigation is encouraged. 4.  Valsalva exercise as needed. 5.  The strategies to cope with tinnitus, including the use of masker, hearing aids, tinnitus retraining therapy, and avoidance of caffeine and alcohol are discussed.  6.  The patient will return for reevaluation in 5 months.

## 2023-09-14 ENCOUNTER — Ambulatory Visit: Payer: Medicare Other | Admitting: Obstetrics and Gynecology

## 2023-09-14 ENCOUNTER — Ambulatory Visit: Payer: Medicare Other | Admitting: Obstetrics & Gynecology

## 2023-10-06 ENCOUNTER — Encounter: Payer: Self-pay | Admitting: Internal Medicine

## 2023-10-06 NOTE — Telephone Encounter (Signed)
Wife wants to be called today

## 2023-10-07 MED ORDER — FLUTICASONE-SALMETEROL 100-50 MCG/ACT IN AEPB: 1.0000 | INHALATION_SPRAY | Freq: Two times a day (BID) | RESPIRATORY_TRACT | Status: DC

## 2023-10-18 ENCOUNTER — Other Ambulatory Visit: Payer: Self-pay

## 2023-10-18 MED ORDER — FLUTICASONE-SALMETEROL 100-50 MCG/ACT IN AEPB: 1.0000 | INHALATION_SPRAY | Freq: Two times a day (BID) | RESPIRATORY_TRACT | 3 refills | Status: DC

## 2023-10-26 NOTE — Progress Notes (Signed)
 Assessment/Plan:    1.  Essential Tremor  -This is evidenced by the symmetrical nature and longstanding hx of gradually getting worse.  We discussed nature and pathophysiology.  We discussed that this can continue to gradually get worse with time.  We discussed that some medications can worsen this, as can caffeine use.  We discussed medication therapy as well as surgical therapy.  We discussed nonmedicinal therapies such as weighted gloves, weighted spoons, weighted forks, [employer], readi steadi. She asks about cala trio today but told her that she would have to fail medications first.    -I brought out weighted utensils and showed her those today and gave her the opportunity to trial them.  -We discussed details of DBS versus focused ultrasound today including the risks and benefits of each, but both of us  agreed that she did not need these interventions.             -Patient was given primidone  in the past but worried about side effects that she read on the Internet, including vertigo.  Discussed with her that it is fine that she does not want to take medication right now, but would not read about side effects on the Internet related to primidone , because those were typically much higher dosages, given the seizure patients. She asked about asthma related to propranolol.  Discussed this today and last visit and she just is not interested in propranolol.  Ultimately, after a long discussion today, she is bothered by tremor but really does not want to take medication yet.  -She asked about genetic testing for essential tremor and I told her that this was not available.  Subjective:   Melissa Lowe was seen today in follow up for essential tremor.  My previous records were reviewed prior to todays visit.  Her husband is present and supplements the history.  She was not interested in medication last visit, but wanted to follow here yearly.  She reports today that tremor is still there and  frustrating when I can't get my earrings on.  Its much worse with stress, L>R.  She asks about cala trio today.  She asks about weights on the wrists.  With the exception of this, she has actually had quite a good year.  They moved into a new home.  She and her husband have been traveling.   ALLERGIES:   Allergies  Allergen Reactions   Sulfonamide Derivatives     Rash on inside of arms Because of a history of documented adverse serious drug reaction;Medi Alert bracelet  is recommended   Tetracycline     Severe itching on lower leg    CURRENT MEDICATIONS:  Current Meds  Medication Sig   albuterol  (VENTOLIN  HFA) 108 (90 Base) MCG/ACT inhaler USE 1 INHALATION BY MOUTH EVERY  6 HOURS AS NEEDED FOR WHEEZING  OR SHORTNESS OF BREATH   ascorbic acid  (VITAMIN C ) 500 MG tablet Take 1,000 mg by mouth daily.   bimatoprost (LATISSE) 0.03 % ophthalmic solution Place into both eyes. apply evenly along the skin of the upper eyelid at base of eyelashes 3xs weekly at bedtime; repeat procedure for second eye (use a clean applicator).   ELDERBERRY PO Take by mouth. Bucoli Liquid.   famotidine  (PEPCID ) 20 MG tablet Take 40 mg by mouth daily.    fluticasone  (FLONASE ) 50 MCG/ACT nasal spray 1-2 sprays in the left nostril daily   fluticasone -salmeterol (WIXELA INHUB) 100-50 MCG/ACT AEPB Inhale 1 puff into the lungs 2 (  two) times daily.   levothyroxine  (SYNTHROID ) 50 MCG tablet TAKE 1 TABLET BY MOUTH IN  THE MORNING   montelukast  (SINGULAIR ) 10 MG tablet Take 1 tablet (10 mg total) by mouth daily.   Multiple Vitamin (MULTIVITAMIN) tablet Take 1 tablet by mouth daily.   pravastatin  (PRAVACHOL ) 40 MG tablet Take 1 tablet (40 mg total) by mouth at bedtime.   Vitamin D , Cholecalciferol , 25 MCG (1000 UT) CAPS Take by mouth.      Objective:    PHYSICAL EXAMINATION:    VITALS:   Vitals:   10/28/23 1034  BP: 106/60  Pulse: 64  SpO2: 98%  Weight: 178 lb 9.6 oz (81 kg)  Height: 5' 5.5 (1.664 m)    GEN:   The patient appears stated age and is in NAD. HEENT:  Normocephalic, atraumatic.  The mucous membranes are moist. The superficial temporal arteries are without ropiness or tenderness.   Neurological examination:  Orientation:  The patient is alert and oriented x 3.   Cranial nerves: There is good facial symmetry. Extraocular muscles are intact and visual fields are full to confrontational testing. Speech is fluent and clear.  Hearing is intact to conversational tone. Tone: Tone is good throughout. Sensation: Sensation is intact to light touch touch throughout  Coordination:  The patient has no dysdiadichokinesia or dysmetria. Motor: Strength is at least antigravity x 4.    MOVEMENT EXAM: Tremor:  There is no rest tremor today.  Occasional mild head tremor in the yes direction.  Occasional chin tremor.  There is minimal postural tremor.  There is mild intention tremor.  Archimedes spirals look stable from last year and better than 2 years ago.  The right is better than the left (she is right-handed)  I have reviewed and interpreted the following labs independently   Chemistry      Component Value Date/Time   NA 141 08/17/2023 1142   K 4.5 08/17/2023 1142   CL 105 08/17/2023 1142   CO2 28 08/17/2023 1142   BUN 10 08/17/2023 1142   CREATININE 0.92 08/17/2023 1142   CREATININE 0.97 (H) 07/09/2020 1052      Component Value Date/Time   CALCIUM 10.1 08/17/2023 1142   ALKPHOS 48 08/17/2023 1142   AST 15 08/17/2023 1142   ALT 13 08/17/2023 1142   BILITOT 0.5 08/17/2023 1142      Lab Results  Component Value Date   WBC 6.3 02/11/2023   HGB 13.6 02/11/2023   HCT 40.6 02/11/2023   MCV 88.9 02/11/2023   PLT 276.0 02/11/2023   Lab Results  Component Value Date   TSH 3.24 08/17/2023     Chemistry      Component Value Date/Time   NA 141 08/17/2023 1142   K 4.5 08/17/2023 1142   CL 105 08/17/2023 1142   CO2 28 08/17/2023 1142   BUN 10 08/17/2023 1142   CREATININE 0.92  08/17/2023 1142   CREATININE 0.97 (H) 07/09/2020 1052      Component Value Date/Time   CALCIUM 10.1 08/17/2023 1142   ALKPHOS 48 08/17/2023 1142   AST 15 08/17/2023 1142   ALT 13 08/17/2023 1142   BILITOT 0.5 08/17/2023 1142      Total time spent on today's visit was 37 minutes, including both face-to-face time and nonface-to-face time.  Time included that spent on review of records (prior notes available to me/labs/imaging if pertinent), discussing treatment and goals, answering patient's questions and coordinating care.  Cc:  Geofm Glade PARAS, MD

## 2023-10-28 ENCOUNTER — Ambulatory Visit: Payer: Medicare Other | Admitting: Neurology

## 2023-10-28 ENCOUNTER — Encounter: Payer: Self-pay | Admitting: Neurology

## 2023-10-28 VITALS — BP 106/60 | HR 64 | Ht 65.5 in | Wt 178.6 lb

## 2023-10-28 DIAGNOSIS — G250 Essential tremor: Secondary | ICD-10-CM

## 2023-10-28 NOTE — Patient Instructions (Signed)

## 2023-11-02 ENCOUNTER — Other Ambulatory Visit: Payer: Self-pay | Admitting: Internal Medicine

## 2023-11-05 ENCOUNTER — Ambulatory Visit: Payer: Medicare Other | Admitting: Internal Medicine

## 2023-11-05 ENCOUNTER — Encounter: Payer: Self-pay | Admitting: Internal Medicine

## 2023-11-05 VITALS — BP 106/74 | HR 57 | Temp 98.3°F | Ht 65.5 in | Wt 175.0 lb

## 2023-11-05 DIAGNOSIS — J45901 Unspecified asthma with (acute) exacerbation: Secondary | ICD-10-CM | POA: Insufficient documentation

## 2023-11-05 DIAGNOSIS — J069 Acute upper respiratory infection, unspecified: Secondary | ICD-10-CM

## 2023-11-05 DIAGNOSIS — I5189 Other ill-defined heart diseases: Secondary | ICD-10-CM | POA: Insufficient documentation

## 2023-11-05 MED ORDER — HYDROCOD POLI-CHLORPHE POLI ER 10-8 MG/5ML PO SUER: 5.0000 mL | Freq: Two times a day (BID) | ORAL | 0 refills | Status: DC | PRN

## 2023-11-05 NOTE — Assessment & Plan Note (Signed)
 Acute Mild in nature Likely related to URI Wheezing right base, otherwise lungs sound clear No evidence of active infection so we will hold off on antibiotics Does not need steroids Using Symbicort  once a day-advised increasing to twice a day Use albuterol  if needed-if it helps use as needed, if it does not do not need to use If her symptoms do not completely resolve she will let me know

## 2023-11-05 NOTE — Progress Notes (Signed)
 Subjective:    Patient ID: Melissa Lowe, female    DOB: August 16, 1941, 83 y.o.   MRN: [id number]      HPI Melissa Lowe is here for  Chief Complaint  Patient presents with   Cough    Cough and congestion and lungs feel a little tight; Cough is somewhat productive   Medical Management of Chronic Issues    Cardiac test done (wants to go over results)   Has had some mild cold symptoms and asthma symptoms over the past week or so and was concerned about possibility of pneumonia which her husband had.  She has had some mild nasal congestion.  She states a cough that was initially productive and now is more dry in nature.  She has a little bit of chest tightness in the center anterior chest that does not seem to be improving.  She denies any fevers, fatigue, shortness of breath, wheezing or other cold symptoms.  She is using her Symbicort  once a day and has not felt like she needed the albuterol .  She is also taking her Singulair .        Medications and allergies reviewed with patient and updated if appropriate.  Current Outpatient Medications on File Prior to Visit  Medication Sig Dispense Refill   albuterol  (VENTOLIN  HFA) 108 (90 Base) MCG/ACT inhaler USE 1 INHALATION BY MOUTH EVERY  6 HOURS AS NEEDED FOR WHEEZING  OR SHORTNESS OF BREATH 36 g 3   bimatoprost (LATISSE) 0.03 % ophthalmic solution Place into both eyes. apply evenly along the skin of the upper eyelid at base of eyelashes 3xs weekly at bedtime; repeat procedure for second eye (use a clean applicator).     ELDERBERRY PO Take by mouth. [place] Liquid.     famotidine  (PEPCID ) 20 MG tablet Take 40 mg by mouth daily.      fluticasone  (FLONASE ) 50 MCG/ACT nasal spray 1-2 sprays in the left nostril daily     fluticasone -salmeterol (WIXELA INHUB) 100-50 MCG/ACT AEPB Inhale 1 puff into the lungs 2 (two) times daily. 60 each 3   levothyroxine  (SYNTHROID ) 50 MCG tablet TAKE 1 TABLET BY MOUTH IN  THE MORNING 90 tablet 3    montelukast  (SINGULAIR ) 10 MG tablet Take 1 tablet (10 mg total) by mouth daily. 90 tablet 3   Multiple Vitamin (MULTIVITAMIN) tablet Take 1 tablet by mouth daily.     pravastatin  (PRAVACHOL ) 40 MG tablet Take 1 tablet (40 mg total) by mouth at bedtime. 90 tablet 3   Vitamin D , Cholecalciferol , 25 MCG (1000 UT) CAPS Take by mouth.     No current facility-administered medications on file prior to visit.    Review of Systems  Constitutional:  Positive for chills. Negative for diaphoresis, fatigue and fever.  HENT:  Positive for congestion (Mild). Negative for ear pain, sinus pain and sore throat.   Respiratory:  Positive for cough (dry cough) and chest tightness (in bronchi). Negative for shortness of breath and wheezing.   Neurological:  Negative for light-headedness and headaches.       Objective:   Vitals:   11/05/23 1314  BP: 106/74  Pulse: (!) 57  Temp: 98.3 F (36.8 C)   BP Readings from Last 3 Encounters:  11/05/23 106/74  10/28/23 106/60  08/17/23 120/74   Wt Readings from Last 3 Encounters:  11/05/23 175 lb (79.4 kg)  10/28/23 178 lb 9.6 oz (81 kg)  09/08/23 174 lb (78.9 kg)   Body mass index is 28.68 kg/m.  Physical Exam Constitutional:      General: She is not in acute distress.    Appearance: Normal appearance. She is not ill-appearing.  HENT:     Head: Normocephalic and atraumatic.     Right Ear: Tympanic membrane, ear canal and external ear normal.     Left Ear: Tympanic membrane, ear canal and external ear normal.     Mouth/Throat:     Mouth: Mucous membranes are moist.     Pharynx: No oropharyngeal exudate or posterior oropharyngeal erythema.  Eyes:     Conjunctiva/sclera: Conjunctivae normal.  Cardiovascular:     Rate and Rhythm: Normal rate and regular rhythm.  Pulmonary:     Effort: Pulmonary effort is normal. No respiratory distress.     Breath sounds: Wheezing (Right base only-improved listening to it second time) present. No rales.   Musculoskeletal:     Cervical back: Neck supple. No tenderness.  Lymphadenopathy:     Cervical: No cervical adenopathy.  Skin:    General: Skin is warm and dry.  Neurological:     Mental Status: She is alert.            Assessment & Plan:    See Problem List for Assessment and Plan of chronic medical problems.

## 2023-11-05 NOTE — Assessment & Plan Note (Signed)
 Acute Likely viral in nature Symptoms have improved and now just has a little bit of chest tightness which is likely more of her asthma then infection No need for antibiotics Symptomatic treatment Tussionex Increase Symbicort  to twice daily, albuterol  as needed She will let me know if her symptoms are not improving/resolving

## 2023-11-05 NOTE — Patient Instructions (Addendum)
    Start using the Symbicort  twice a day.      Medications changes include :   tussionex cough syrup    ? Consider gabapentin  for the tremor.   ? Consider xanax ( alprazolam) as needed for tremor    Return if symptoms worsen or fail to improve.

## 2023-11-09 ENCOUNTER — Ambulatory Visit: Payer: Medicare Other | Admitting: Internal Medicine

## 2023-12-02 DIAGNOSIS — R531 Weakness: Secondary | ICD-10-CM | POA: Insufficient documentation

## 2023-12-02 DIAGNOSIS — Z7409 Other reduced mobility: Secondary | ICD-10-CM | POA: Insufficient documentation

## 2023-12-02 DIAGNOSIS — M25669 Stiffness of unspecified knee, not elsewhere classified: Secondary | ICD-10-CM | POA: Insufficient documentation

## 2023-12-28 ENCOUNTER — Telehealth: Payer: Self-pay | Admitting: Cardiovascular Disease

## 2023-12-28 ENCOUNTER — Encounter: Payer: Self-pay | Admitting: Obstetrics and Gynecology

## 2023-12-28 ENCOUNTER — Ambulatory Visit (INDEPENDENT_AMBULATORY_CARE_PROVIDER_SITE_OTHER): Payer: Medicare Other | Admitting: Obstetrics and Gynecology

## 2023-12-28 VITALS — BP 109/68 | HR 74 | Ht 65.5 in | Wt 178.0 lb

## 2023-12-28 DIAGNOSIS — N8110 Cystocele, unspecified: Secondary | ICD-10-CM | POA: Diagnosis not present

## 2023-12-28 DIAGNOSIS — R350 Frequency of micturition: Secondary | ICD-10-CM

## 2023-12-28 LAB — POCT URINALYSIS DIPSTICK
Bilirubin, UA: NEGATIVE
Blood, UA: NEGATIVE
Glucose, UA: NEGATIVE
Ketones, UA: NEGATIVE
Leukocytes, UA: NEGATIVE — AB
Nitrite, UA: NEGATIVE
Protein, UA: NEGATIVE
Spec Grav, UA: 1.015 (ref 1.010–1.025)
Urobilinogen, UA: 0.2 U/dL
pH, UA: 5.5 (ref 5.0–8.0)

## 2023-12-28 NOTE — Telephone Encounter (Signed)
 Pt calling in to speak with Dr. Duke Salvia about her f/u appointments.

## 2023-12-28 NOTE — Telephone Encounter (Signed)
Spoke with patient and scheduled her for follow up

## 2023-12-28 NOTE — Patient Instructions (Addendum)
 You have a stage 2 (out of 4) prolapse.  We discussed the fact that it is not life threatening but there are several treatment options. For treatment of pelvic organ prolapse, we discussed options for management including expectant management, conservative management, and surgical management, such as Kegels, a pessary, pelvic floor physical therapy, and specific surgical procedures.     Plan for surgery: Anterior repair, cystoscopy  Surgical risks: Success rate of 80%. We discussed risks of bleeding, infection, damage to surrounding organs including bowel, bladder, blood vessels, ureters and nerves, need for further surgery, numbness and weakness at any body site, buttock pain, postoperative cognitive dysfunction, and the rarer risks of blood clot, heart attack, pneumonia, death.

## 2023-12-28 NOTE — Progress Notes (Signed)
 New Patient Evaluation and Consultation  Referring Provider: [name], MD PCP: Pincus Sanes, MD Date of Service: 12/28/2023  SUBJECTIVE Chief Complaint: New Patient (Initial Visit) Melissa Lowe is a 83 y.o. female here for a consult for cystocele. Pt said she currently wears a pessary.)  History of Present Illness: Melissa Lowe is a 83 y.o. White or Caucasian female seen in consultation at the request of Dr Seymour Bars for evaluation of prolapse.    Review of records significant for: Using ring with support for cystocele  Urinary Symptoms: Does not leak urine.   Day time voids 4-6.  Nocturia: 1-2 times per night to void. Voiding dysfunction:  empties bladder well.  Patient does not use a catheter to empty bladder.  When urinating, patient feels she has no difficulties   UTIs:  0  UTI's in the last year.   Denies history of blood in urine and kidney or bladder stones   Pelvic Organ Prolapse Symptoms:                  Patient Admits to a feeling of a bulge the vaginal area. It has been present for several years.  Patient Denies seeing a bulge.  This bulge is bothersome. Has pessary removed and cleaned about every 6 months. Does not use vaginal estrogen.  History of A&P repair in 2014 with Dr Seymour Bars  Bowel Symptom: Bowel movements: 1 time(s) per day Stool consistency: soft  Straining: no.  Splinting: no.  Incomplete evacuation: no.  Patient Denies accidental bowel leakage / fecal incontinence Bowel regimen: fiber   Sexual Function Sexually active: no.   Pelvic Pain Denies pelvic pain   Past Medical History:  Past Medical History:  Diagnosis Date   Allergy    Asthma    Chest pain of uncertain etiology 02/24/2023   Diverticulosis    Fibroid    HSV infection    nose   Hyperlipidemia    Hypothyroidism    IBS (irritable bowel syndrome)    PAC (premature atrial contraction) 09/22/2016   PVC (premature ventricular contraction) 02/24/2023    Vertigo      Past Surgical History:   Past Surgical History:  Procedure Laterality Date   ABDOMINAL HYSTERECTOMY     2  fibroids   ANTERIOR AND POSTERIOR REPAIR N/A 01/30/2013   Posterior repair ONLY, Surgeon: [name], MD;  Location: [facility];  Service: Gynecology;  Laterality: N/A;   arthoscopy Left 08/2018   L knee repair torn meniscus   CATARACT EXTRACTION     CHOLECYSTECTOMY     colon polypectomy  2001   Dr Kinnie Scales   COLONOSCOPY  12/25/2013   Tics , Dr Horris Latino 2020   EYE SURGERY     KNEE SURGERY Left 2020   MENISCUS REPAIR   REPAIR FASCIAL DEFECT LEG  1972   SEPTOPLASTY     THIGH FASCIOTOMY       Past OB/GYN History: OB History  Gravida Para Term Preterm AB Living  0 0 0 0 0 0  SAB IAB Ectopic Multiple Live Births  0 0 0 0 0   S/p hysterectomy No results found for: "DIAGPAP", "HPVHIGH", "ADEQPAP"  Medications: Patient has a current medication list which includes the following prescription(s): albuterol, bimatoprost, chlorpheniramine-hydrocodone, elderberry, famotidine, fluticasone, fluticasone-salmeterol, levothyroxine, montelukast, multivitamin, pravastatin, and vitamin d (cholecalciferol).   Allergies: Patient is allergic to sulfonamide derivatives and tetracycline.   Social History:  Social History   Tobacco Use   Smoking status: Never  Smokeless tobacco: Never  Vaping Use   Vaping status: Never Used  Substance Use Topics   Alcohol use: Yes    Alcohol/week: 2.0 standard drinks of alcohol    Types: 2 Glasses of wine per week    Comment: once a week   Drug use: No    Relationship status: married Patient lives with husband.   Patient is not employed. Regular exercise: No History of abuse: No  Family History:   Family History  Problem Relation Age of Onset   Dementia Mother    Cancer Father 62       throat CA; pneumonia   Asthma Sister    Breast cancer Sister    Arrhythmia Sister        pacemaker for bradycardia   Stroke  Maternal Grandmother        in 71s   Cancer Maternal Grandfather    Stroke Paternal Grandmother 33     Review of Systems: Review of Systems  Constitutional:  Negative for fever, malaise/fatigue and weight loss.  Respiratory:  Negative for cough, shortness of breath and wheezing.   Cardiovascular:  Positive for chest pain. Negative for palpitations and leg swelling.  Gastrointestinal:  Negative for abdominal pain and blood in stool.  Genitourinary:  Negative for dysuria.  Musculoskeletal:  Negative for myalgias.  Skin:  Negative for rash.  Neurological:  Positive for dizziness and headaches.  Endo/Heme/Allergies:  Does not bruise/bleed easily.  Psychiatric/Behavioral:  Negative for depression. The patient is not nervous/anxious.      OBJECTIVE Physical Exam: Vitals:   12/28/23 1010  BP: 109/68  Pulse: 74  Weight: 178 lb (80.7 kg)  Height: 5' 5.5" (1.664 m)    Physical Exam Vitals reviewed. Exam conducted with a chaperone present.  Constitutional:      General: She is not in acute distress. Pulmonary:     Effort: Pulmonary effort is normal.  Abdominal:     General: There is no distension.     Palpations: Abdomen is soft.     Tenderness: There is no abdominal tenderness. There is no rebound.  Musculoskeletal:        General: No swelling. Normal range of motion.  Skin:    General: Skin is warm and dry.     Findings: No rash.  Neurological:     Mental Status: She is alert and oriented to person, place, and time.  Psychiatric:        Mood and Affect: Mood normal.        Behavior: Behavior normal.      GU / Detailed Urogynecologic Evaluation:  Pelvic Exam: Normal external female genitalia; Bartholin's and Skene's glands normal in appearance; urethral meatus normal in appearance, no urethral masses or discharge.   Urethra was prepped with betadine and straight catheter placed. Clear urine was drained.   CST: negative  Pessary was removed. And cleaned.    s/p  hysterectomy: Speculum exam reveals normal vaginal mucosa with atrophy and normal vaginal cuff.  Adnexa no mass, fullness, tenderness.     Pelvic floor strength II/V  Pelvic floor musculature: Right levator non-tender, Right obturator non-tender, Left levator non-tender, Left obturator non-tender  POP-Q:   POP-Q  -1                                            Aa   -1  Ba  -5.5                                              C   2                                            Gh  5                                            Pb  6                                            tvl   -3                                            Ap  -3                                            Bp                                                 D      Rectal Exam:  Normal external rectum  Post-Void Residual (PVR) by Bladder Scan: In order to evaluate bladder emptying, we discussed obtaining a postvoid residual and patient agreed to this procedure.  Procedure: The ultrasound unit was placed on the patient's abdomen in the suprapubic region after the patient had voided.    Post Void Residual - 12/28/23 1033       Post Void Residual   Post Void Residual 34 mL              Laboratory Results: Lab Results  Component Value Date   COLORU Yellow 12/28/2023   CLARITYU Clear 12/28/2023   GLUCOSEUR Negative 12/28/2023   BILIRUBINUR Negative 12/28/2023   KETONESU Negative 12/28/2023   SPECGRAV 1.015 12/28/2023   RBCUR Negative 12/28/2023   PHUR 5.5 12/28/2023   PROTEINUR Negative 12/28/2023   UROBILINOGEN 0.2 12/28/2023   LEUKOCYTESUR Negative (A) 12/28/2023    Lab Results  Component Value Date   CREATININE 0.92 08/17/2023   CREATININE 0.93 02/11/2023   CREATININE 0.86 08/06/2022    Lab Results  Component Value Date   HGBA1C 5.8 08/17/2023    Lab Results  Component Value Date   HGB 13.6 02/11/2023     ASSESSMENT AND PLAN Ms.  Ezelle is a 83 y.o. with:  1. Prolapse of anterior vaginal wall   2. Urinary frequency     - Stage II anterior, Stage 0 posterior, Stage 0 apical prolapse - For treatment of pelvic organ prolapse, we discussed options for management including expectant management, conservative management, and  surgical management, such as Kegels, a pessary, pelvic floor physical therapy, and specific surgical procedures (recommended anterior repair, cystoscopy).  - She is interested in proceeding with surgery.   General Surgical Risks: For all procedures, there are risks of bleeding, infection, damage to surrounding organs including but not limited to bowel, bladder, blood vessels, ureters and nerves, and need for further surgery if an injury were to occur. These risks are all low with minimally invasive surgery.   There are risks of numbness and weakness at any body site or buttock/rectal pain.  It is possible that baseline pain can be worsened by surgery, either with or without mesh. If surgery is vaginal, there is also a low risk of possible conversion to laparoscopy or open abdominal incision where indicated. Very rare risks include blood transfusion, blood clot, heart attack, pneumonia, or death.   There is also a risk of short-term postoperative urinary retention with need to use a catheter. About half of patients need to go home from surgery with a catheter, which is then later removed in the office. The risk of long-term need for a catheter is very low. There is also a risk of worsening of overactive bladder.    Prolapse (with or without mesh): Risk factors for surgical failure  include things that put pressure on your pelvis and the surgical repair, including obesity, chronic cough, and heavy lifting or straining (including lifting children or adults, straining on the toilet, or lifting heavy objects such as furniture or anything weighing >25 lbs. Risks of recurrence is 20-30% with vaginal native tissue  repair and a less than 10% with sacrocolpopexy with mesh.     - For preop Visit:  She is required to have a visit within 30 days of her surgery.    - Medical clearance: required from Cardiology. Letter sent to Dr Duke Salvia requesting risk stratification and medical optimization. - Anticoagulant use: No - Medicaid Hysterectomy form: n/a - Accepts blood transfusion: Yes - Expected length of stay: outpatient  Request sent for surgery scheduling.   Marguerita Beards, MD    Marguerita Beards, MD

## 2023-12-29 ENCOUNTER — Telehealth (HOSPITAL_BASED_OUTPATIENT_CLINIC_OR_DEPARTMENT_OTHER): Payer: Self-pay | Admitting: *Deleted

## 2023-12-29 NOTE — Telephone Encounter (Signed)
   Name: KINDELL STRADA  DOB: 02-21-1941  MRN: [id number]  Primary Cardiologist: Chilton Si, MD  Chart reviewed as part of pre-operative protocol coverage. The patient has an upcoming visit scheduled with Dr. Duke Salvia on 12/30/2023 at which time clearance can be addressed in case there are any issues that would impact surgical recommendations.   I added preop FYI to appointment note so that provider is aware to address at time of outpatient visit.  Per office protocol the cardiology provider should forward their finalized clearance decision and recommendations regarding antiplatelet therapy to the requesting party below.    I will route this message as FYI to requesting party and remove this message from the preop box as separate preop APP input not needed at this time.   Please call with any questions.  Napoleon Form, Leodis Rains, NP  12/29/2023, 8:36 AM

## 2023-12-29 NOTE — Telephone Encounter (Signed)
   Pre-operative Risk Assessment    Patient Name: Melissa Lowe  DOB: 01-Apr-1941 MRN: [id number]   Date of last office visit: 05/28/2023 Date of next office visit: 12/30/2023 Pre Op added to appt notes.  Request for Surgical Clearance    Procedure:   pelvic organ porlapse and stress incontinence  Date of Surgery:  Clearance TBD                                 Surgeon:  Dr. Lanetta Inch Surgeon's Group or Practice Name:  [facility] Phone number:  [phone] Fax number:  [phone]   Type of Clearance Requested:   - Medical    Type of Anesthesia:  General    Additional requests/questions:    Signed, Emmit Pomfret   12/29/2023, 8:28 AM

## 2023-12-30 ENCOUNTER — Ambulatory Visit (INDEPENDENT_AMBULATORY_CARE_PROVIDER_SITE_OTHER): Admitting: Cardiovascular Disease

## 2023-12-30 ENCOUNTER — Encounter: Payer: Self-pay | Admitting: Internal Medicine

## 2023-12-30 ENCOUNTER — Encounter (HOSPITAL_BASED_OUTPATIENT_CLINIC_OR_DEPARTMENT_OTHER): Payer: Self-pay | Admitting: Cardiovascular Disease

## 2023-12-30 VITALS — BP 104/68 | HR 71 | Ht 65.5 in | Wt 179.8 lb

## 2023-12-30 DIAGNOSIS — I340 Nonrheumatic mitral (valve) insufficiency: Secondary | ICD-10-CM | POA: Diagnosis not present

## 2023-12-30 DIAGNOSIS — I700 Atherosclerosis of aorta: Secondary | ICD-10-CM

## 2023-12-30 HISTORY — DX: Atherosclerosis of aorta: I70.0

## 2023-12-30 NOTE — Progress Notes (Signed)
 Cardiology Office Note:  .   Date:  12/30/2023  ID:  Melissa Lowe, DOB [birth year], MRN [id number] PCP: Pincus Sanes, MD  [facility] Providers Cardiologist:  Chilton Si, MD    History of Present Illness: .   Melissa Lowe is a 83 y.o. female with hyperlipidemia, hypothyroidism and asthma, here for follow-up.  She was seen 02/2023 for chest pain. She was first seen in 2017 for palpitations. who presents for evaluation of an irregular heart rhythm. [name] was seen by the speech and hearing clinic where her heart rhythm appeared to be irregular. She requested consultation to have this evaluated.  She wore a heart monitor that showed frequent PACs but no atrial fibrillation. She saw her PCP 01/2023 and notes an occasional sharp pain in her chest.   At her visit 02/2023 she reported sharp, atypical chest pain.  She did have a PVC on EKG that day.  Stress test was ordered but not performed.  She followed up with Gillian Shields, NP 05/2023.  She was hesitant about any premedications and had a calcium score which revealed aortic atherosclerosis and a calcium score of 80.  This was 45th percentile for age and gender.  Ms. Omara had an echocardiogram since her last visit in May, which showed normal heart pumping function, grade one diastolic dysfunction, and mild mitral valve leakage. Her aorta was borderline dilated at 3.9 cm, which is within normal limits for her age. She has a history of aortic atherosclerosis and a calcium score in the 45th percentile for her age, race, and gender. She is currently on pravastatin to manage her cholesterol.  She has a history of asthma since age [age] and recently experienced a respiratory infection contracted from a family visit in February. This resulted in a runny nose and persistent cough, with occasional wheezing localized to her upper airway. She uses her albuterol inhaler, which provides relief. She had not used the inhaler for many years  prior to this infection.  She recently traveled [place], during which she experienced increased physical activity leading to knee inflammation. She is currently undergoing physical therapy for her knees and hip bursitis, which was treated with an injection that did not provide relief. Her exercise is currently limited to walking around the house, to the mailbox, and attending physical therapy sessions.  Her diet is described as good, with rare consumption of red meat and regular intake of chicken, salmon, and vegetables. She mentions a past habit of eating ice cream frequently, which has been reduced. She acknowledges a chocolate craving but generally maintains a healthy diet.     ROS:  As per HPI  Studies Reviewed: Marland Kitchen       Coronary calcium score 06/2023: IMPRESSION: Coronary calcium score of 80. This was 35 th percentile for age and sex matched control.  Echo 03/2023 1. Left ventricular ejection fraction, by estimation, is 60 to 65%. Left  ventricular ejection fraction by 3D volume is 60 %. The left ventricle has  normal function. The left ventricle has no regional wall motion  abnormalities. Left ventricular diastolic   parameters are consistent with Grade I diastolic dysfunction (impaired  relaxation). The average left ventricular global longitudinal strain is  -22.5 %. The global longitudinal strain is normal.   2. Right ventricular systolic function is normal. The right ventricular  size is normal. There is normal pulmonary artery systolic pressure. The  estimated right ventricular systolic pressure is  25.1 mmHg.   3. The mitral valve is normal in structure. Mild mitral valve  regurgitation.   4. The aortic valve is tricuspid. Aortic valve regurgitation is mild.   5. Aortic dilatation noted. There is borderline dilatation of the aortic  root, measuring 39 mm.  Risk Assessment/Calculations:             Physical Exam:   VS:  BP 104/68    Pulse 71   Ht 5' 5.5" (1.664 m)   Wt 179 lb 12.8 oz (81.6 kg)   SpO2 96%   BMI 29.47 kg/m  , BMI Body mass index is 29.47 kg/m. GENERAL:  Well appearing HEENT: Pupils equal round and reactive, fundi not visualized, oral mucosa unremarkable NECK:  No jugular venous distention, waveform within normal limits, carotid upstroke brisk and symmetric, no bruits, no thyromegaly LUNGS:  Clear to auscultation bilaterally HEART:  RRR.  PMI not displaced or sustained,S1 and S2 within normal limits, no S3, no S4, no clicks, no rubs, no murmurs ABD:  Flat, positive bowel sounds normal in frequency in pitch, no bruits, no rebound, no guarding, no midline pulsatile mass, no hepatomegaly, no splenomegaly EXT:  2 plus pulses throughout, no edema, no cyanosis no clubbing SKIN:  No rashes no nodules NEURO:  Cranial nerves II through XII grossly intact, motor grossly intact throughout PSYCH:  Cognitively intact, oriented to person place and time   ASSESSMENT AND PLAN: .    # Non-obstructive Coronary artery disease Calcium score in the 45th percentile indicates less plaque than expected for her age. Emphasized maintaining LDL cholesterol under 70 mg/dL to prevent plaque progression. Discussed potential plaque regression with statins, lifestyle changes, vegan diet, and medications like Repatha or Praluent. - Continue pravastatin. - Encourage regular exercise once knees heal. - Maintain a diet low in red meat and high in vegetables and fruits.  # Ascending aorta: Aortic dilation at 3.9 cm is borderline and unlikely to progress rapidly given her age.  No further evaluation needed.  BP is controlled.   # Mitral valve regurgitation Mild mitral valve regurgitation is common and benign, especially at her age.  # Asthma Recent wheezing and increased albuterol use likely due to respiratory infection. - Continue albuterol inhaler as needed.  # Knee and hip pain Pain exacerbated by physical activity, impacting  exercise ability. Undergoing physical therapy and received injections for pain management. - Continue physical therapy for knee and hip pain.       Dispo: f/u as needed  Signed, Chilton Si, MD

## 2023-12-30 NOTE — Patient Instructions (Signed)
 Medication Instructions:  ?Your physician recommends that you continue on your current medications as directed. Please refer to the Current Medication list given to you today.  ? ?Labwork: ?NONE ? ?Testing/Procedures: ?NONE ? ?Follow-Up: ?AS NEEDED  ? ?  ?

## 2024-02-02 ENCOUNTER — Ambulatory Visit (INDEPENDENT_AMBULATORY_CARE_PROVIDER_SITE_OTHER): Payer: Medicare Other | Admitting: Otolaryngology

## 2024-02-02 VITALS — Ht 65.5 in | Wt 176.5 lb

## 2024-02-02 DIAGNOSIS — H6993 Unspecified Eustachian tube disorder, bilateral: Secondary | ICD-10-CM

## 2024-02-02 DIAGNOSIS — H903 Sensorineural hearing loss, bilateral: Secondary | ICD-10-CM | POA: Diagnosis not present

## 2024-02-02 DIAGNOSIS — R0981 Nasal congestion: Secondary | ICD-10-CM | POA: Diagnosis not present

## 2024-02-02 DIAGNOSIS — H9313 Tinnitus, bilateral: Secondary | ICD-10-CM

## 2024-02-02 DIAGNOSIS — J310 Chronic rhinitis: Secondary | ICD-10-CM | POA: Diagnosis not present

## 2024-02-02 DIAGNOSIS — R42 Dizziness and giddiness: Secondary | ICD-10-CM

## 2024-02-03 NOTE — Progress Notes (Unsigned)
 Patient ID: Melissa Lowe, female   DOB: 08/01/1941, 83 y.o.   MRN: [id number]  Follow-up: Hearing loss, tinnitus, eustachian tube dysfunction New complaint: Dizziness  HPI: The patient is an 83 year old female who returns today for her follow-up evaluation.  The patient was last seen in November 2024.  At that time, she was noted to have bilateral high-frequency sensorineural hearing loss, bilateral tinnitus, and bilateral eustachian tube dysfunction.  She was treated with Flonase nasal spray and Valsalva exercise.  The patient returns today complaining of persistent tinnitus.  She was treated at the tinnitus clinic at [facility].  However, she continues to be symptomatic.  Her eustachian tube dysfunction and clogging sensation in her ears have improved with the use of Flonase.  The patient has a new complaint of dizziness today.  According to the patient, she had an episode of dizziness recently.  She noted an abnormal head moving sensation while driving from [place] to [place].  She denies any otalgia, otorrhea, or recent change in her hearing.  Exam: General: Communicates without difficulty, well nourished, no acute distress. Head: Normocephalic, no evidence injury, no tenderness, facial buttresses intact without stepoff. Face/sinus: No tenderness to palpation and percussion. Facial movement is normal and symmetric. Eyes: PERRL, EOMI. No scleral icterus, conjunctivae clear. Neuro: CN II exam reveals vision grossly intact.  No nystagmus at any point of gaze. Ears: Auricles well formed without lesions.  Ear canals are intact without mass or lesion.  No erythema or edema is appreciated.  The TMs are intact without fluid. Nose: External evaluation reveals normal support and skin without lesions.  Dorsum is intact.  Anterior rhinoscopy reveals congested mucosa over anterior aspect of inferior turbinates and intact septum.  No purulence noted. Oral:  Oral cavity and oropharynx are intact, symmetric,  without erythema or edema.  Mucosa is moist without lesions. Neck: Full range of motion without pain.  There is no significant lymphadenopathy.  No masses palpable.  Thyroid bed within normal limits to palpation.  Parotid glands and submandibular glands equal bilaterally without mass.  Trachea is midline. Neuro:  CN 2-12 grossly intact. Vestibular: No nystagmus at any point of gaze.  Vestibular: There is no nystagmus with pneumatic pressure on either tympanic membrane or Valsalva. The cerebellar examination is unremarkable.   Assessment: 1.  Chronic rhinitis with nasal mucosal congestion and bilateral eustachian tube dysfunction.  Her symptoms have clinically improved with the use of Flonase. 2.  Persistent bilateral tinnitus and subjectively stable bilateral high-frequency sensorineural hearing loss. 3.  Her ear canals, tympanic membranes, and middle ear spaces are normal.  No middle ear effusion or infection is noted. 4.  Recurrent dizziness of unknown etiology. The possible differential diagnoses include transient BPPV, vestibular migraine, Meniere's disease, peripheral vestibular dysfunction, or other central/systemic causes.    Plan: 1.  The physical exam findings are reviewed with the patient. 2.  Continue with Flonase nasal spray and Valsalva exercise daily. 3.  The strategies to cope with tinnitus, including the use of masker, hearing aids, tinnitus retraining therapy, and avoidance of caffeine and alcohol are discussed.  4.  The pathophysiology of vestibular dysfunction and dizziness are discussed extensively with the patient. The possible differential diagnoses are reviewed. Questions are invited and answered.   5.  If her dizziness recurs, she may benefit from an otology referral for possible vestibular testing and evaluation. 6.  The patient is encouraged to call with any questions or concerns.

## 2024-02-04 DIAGNOSIS — R42 Dizziness and giddiness: Secondary | ICD-10-CM | POA: Insufficient documentation

## 2024-02-21 ENCOUNTER — Encounter: Payer: Self-pay | Admitting: Internal Medicine

## 2024-02-21 NOTE — Progress Notes (Unsigned)
 Subjective:    Patient ID: Melissa Lowe, female    DOB: 06-Nov-1940, 83 y.o.   MRN: [id number]     HPI Melissa Lowe is here for follow up of her chronic medical problems.  She is walking 1/2 mile and there is a slight incline -gets a little winded with being fine.  She has never had that before and she was not sure if it was the Zachery Hermes was not quite working quite as well as the Symbicort  or other cause.  She is not sure if the Zachery Hermes works quite as well is a Symbicort  in general.  Recently returned from vacation and did have significant allergies and a flare of her asthma.  She not sure if she developed an infection or not.  She still having a little bit of a productive cough although it has gotten better.   Dermatitis - under right eye, above left eye.  Has been using hydrocortisone 1% cream-has not used it long enough to know if there is any improvement or not.  Knees - OA - went to [place] - has inflammed OA since - [facility] Lydia [facility]  Had steroid injections prior to trip.  Had injections again 3 weeks ago.  It did help  Thoracic back pain for scoliosis - ablation - did not help.   Collapsed bladder - June for surgery   Medications and allergies reviewed with patient and updated if appropriate.  Current Outpatient Medications on File Prior to Visit  Medication Sig Dispense Refill   albuterol  (VENTOLIN  HFA) 108 (90 Base) MCG/ACT inhaler USE 1 INHALATION BY MOUTH EVERY  6 HOURS AS NEEDED FOR WHEEZING  OR SHORTNESS OF BREATH 36 g 3   ascorbic acid (VITAMIN C) 500 MG tablet Take 500 mg by mouth daily.     bimatoprost (LATISSE) 0.03 % ophthalmic solution Place into both eyes. apply evenly along the skin of the upper eyelid at base of eyelashes 3xs weekly at bedtime; repeat procedure for second eye (use a clean applicator).     chlorpheniramine-HYDROcodone  (TUSSIONEX) 10-8 MG/5ML Take 5 mLs by mouth every 12 (twelve) hours as needed. 115 mL 0    ELDERBERRY PO Take by mouth. Bucoli Liquid.     famotidine  (PEPCID ) 20 MG tablet Take 40 mg by mouth daily.      fluticasone  (FLONASE ) 50 MCG/ACT nasal spray 1-2 sprays in the left nostril daily     fluticasone -salmeterol (WIXELA INHUB) 100-50 MCG/ACT AEPB Inhale 1 puff into the lungs 2 (two) times daily. 60 each 3   levothyroxine  (SYNTHROID ) 50 MCG tablet TAKE 1 TABLET BY MOUTH IN  THE MORNING 90 tablet 3   montelukast  (SINGULAIR ) 10 MG tablet Take 1 tablet (10 mg total) by mouth daily. 90 tablet 3   Multiple Vitamin (MULTIVITAMIN) tablet Take 1 tablet by mouth daily.     pravastatin  (PRAVACHOL ) 40 MG tablet Take 1 tablet (40 mg total) by mouth at bedtime. 90 tablet 3   Vitamin D , Cholecalciferol, 25 MCG (1000 UT) CAPS Take by mouth.     No current facility-administered medications on file prior to visit.     Review of Systems  Constitutional:  Negative for fever.  HENT:  Positive for congestion (minimal - improved). Negative for postnasal drip.   Respiratory:  Positive for cough (dry and productive), shortness of breath (walking up incline) and wheezing.   Cardiovascular:  Positive for chest pain (occ central chest pain - cardio w/u neg) and  leg swelling (RLE when traveling - ? knee related). Negative for palpitations.  Neurological:  Positive for headaches (low grade). Negative for light-headedness.       Objective:   Vitals:   02/22/24 1022  BP: 110/68  Pulse: 75  Temp: 97.9 F (36.6 C)  SpO2: 96%   BP Readings from Last 3 Encounters:  02/22/24 110/68  12/30/23 104/68  12/28/23 109/68   Wt Readings from Last 3 Encounters:  02/22/24 179 lb (81.2 kg)  02/02/24 176 lb 8 oz (80.1 kg)  12/30/23 179 lb 12.8 oz (81.6 kg)   Body mass index is 29.33 kg/m.    Physical Exam Constitutional:      General: She is not in acute distress.    Appearance: Normal appearance.  HENT:     Head: Normocephalic and atraumatic.  Eyes:     Conjunctiva/sclera: Conjunctivae normal.   Cardiovascular:     Rate and Rhythm: Normal rate and regular rhythm.     Heart sounds: Normal heart sounds.  Pulmonary:     Effort: Pulmonary effort is normal. No respiratory distress.     Breath sounds: Normal breath sounds. No wheezing.  Musculoskeletal:     Cervical back: Neck supple.     Right lower leg: No edema.     Left lower leg: No edema.  Lymphadenopathy:     Cervical: No cervical adenopathy.  Skin:    General: Skin is warm and dry.     Findings: No rash.  Neurological:     Mental Status: She is alert. Mental status is at baseline.  Psychiatric:        Mood and Affect: Mood normal.        Behavior: Behavior normal.        Lab Results  Component Value Date   WBC 6.3 02/11/2023   HGB 13.6 02/11/2023   HCT 40.6 02/11/2023   PLT 276.0 02/11/2023   GLUCOSE 93 08/17/2023   CHOL 169 08/17/2023   TRIG 108.0 08/17/2023   HDL 65.80 08/17/2023   LDLDIRECT 115.0 07/05/2019   LDLCALC 82 08/17/2023   ALT 13 08/17/2023   AST 15 08/17/2023   NA 141 08/17/2023   K 4.5 08/17/2023   CL 105 08/17/2023   CREATININE 0.92 08/17/2023   BUN 10 08/17/2023   CO2 28 08/17/2023   TSH 3.24 08/17/2023   HGBA1C 5.8 08/17/2023   MICROALBUR 0.7 01/21/2021     Assessment & Plan:    See Problem List for Assessment and Plan of chronic medical problems.

## 2024-02-21 NOTE — Patient Instructions (Addendum)
      Blood work was ordered.       Medications changes include :   protopic ointment for eyelid dermatitis   We can increase the dose of wixela or try trelegy, qvar, Breo, or asmanex     Return in about 6 months (around 08/23/2024) for Physical Exam.

## 2024-02-22 ENCOUNTER — Ambulatory Visit: Payer: Medicare Other | Admitting: Internal Medicine

## 2024-02-22 ENCOUNTER — Encounter: Payer: Self-pay | Admitting: Internal Medicine

## 2024-02-22 VITALS — BP 110/68 | HR 75 | Temp 97.9°F | Ht 65.5 in | Wt 179.0 lb

## 2024-02-22 DIAGNOSIS — J45909 Unspecified asthma, uncomplicated: Secondary | ICD-10-CM

## 2024-02-22 DIAGNOSIS — E038 Other specified hypothyroidism: Secondary | ICD-10-CM | POA: Diagnosis not present

## 2024-02-22 DIAGNOSIS — E7849 Other hyperlipidemia: Secondary | ICD-10-CM | POA: Diagnosis not present

## 2024-02-22 DIAGNOSIS — J309 Allergic rhinitis, unspecified: Secondary | ICD-10-CM | POA: Insufficient documentation

## 2024-02-22 DIAGNOSIS — N182 Chronic kidney disease, stage 2 (mild): Secondary | ICD-10-CM

## 2024-02-22 DIAGNOSIS — R7303 Prediabetes: Secondary | ICD-10-CM | POA: Diagnosis not present

## 2024-02-22 LAB — CBC WITH DIFFERENTIAL/PLATELET
Basophils Absolute: 0.1 10*3/uL (ref 0.0–0.1)
Basophils Relative: 1.1 % (ref 0.0–3.0)
Eosinophils Absolute: 0.2 10*3/uL (ref 0.0–0.7)
Eosinophils Relative: 3.5 % (ref 0.0–5.0)
HCT: 39.5 % (ref 36.0–46.0)
Hemoglobin: 13.2 g/dL (ref 12.0–15.0)
Lymphocytes Relative: 26.9 % (ref 12.0–46.0)
Lymphs Abs: 1.6 10*3/uL (ref 0.7–4.0)
MCHC: 33.5 g/dL (ref 30.0–36.0)
MCV: 90 fl (ref 78.0–100.0)
Monocytes Absolute: 0.4 10*3/uL (ref 0.1–1.0)
Monocytes Relative: 7.1 % (ref 3.0–12.0)
Neutro Abs: 3.8 10*3/uL (ref 1.4–7.7)
Neutrophils Relative %: 61.4 % (ref 43.0–77.0)
Platelets: 262 10*3/uL (ref 150.0–400.0)
RBC: 4.38 Mil/uL (ref 3.87–5.11)
RDW: 14.5 % (ref 11.5–15.5)
WBC: 6.1 10*3/uL (ref 4.0–10.5)

## 2024-02-22 LAB — LIPID PANEL
Cholesterol: 156 mg/dL (ref 0–200)
HDL: 64.3 mg/dL (ref >39.00)
LDL Cholesterol: 66 mg/dL (ref 0–99)
NonHDL: 91.88
Total CHOL/HDL Ratio: 2
Triglycerides: 131 mg/dL (ref 0.0–149.0)
VLDL: 26.2 mg/dL (ref 0.0–40.0)

## 2024-02-22 LAB — COMPREHENSIVE METABOLIC PANEL WITH GFR
ALT: 14 U/L (ref 0–35)
AST: 14 U/L (ref 0–37)
Albumin: 4.2 g/dL (ref 3.5–5.2)
Alkaline Phosphatase: 44 U/L (ref 39–117)
BUN: 9 mg/dL (ref 6–23)
CO2: 29 meq/L (ref 19–32)
Calcium: 9.3 mg/dL (ref 8.4–10.5)
Chloride: 103 meq/L (ref 96–112)
Creatinine, Ser: 0.85 mg/dL (ref 0.40–1.20)
GFR: 64.56 mL/min (ref >60.00)
Glucose, Bld: 93 mg/dL (ref 70–99)
Potassium: 4.2 meq/L (ref 3.5–5.1)
Sodium: 136 meq/L (ref 135–145)
Total Bilirubin: 0.6 mg/dL (ref 0.2–1.2)
Total Protein: 6.5 g/dL (ref 6.0–8.3)

## 2024-02-22 LAB — HEMOGLOBIN A1C: Hgb A1c MFr Bld: 5.8 % (ref 4.6–6.5)

## 2024-02-22 LAB — TSH: TSH: 2.09 u[IU]/mL (ref 0.35–5.50)

## 2024-02-22 MED ORDER — TACROLIMUS 0.03 % EX OINT: TOPICAL_OINTMENT | Freq: Two times a day (BID) | CUTANEOUS | 0 refills | Status: DC | PRN

## 2024-02-22 NOTE — Assessment & Plan Note (Signed)
 I think her current symptoms are more likely allergy related with the possibility of viral infection Symptoms are improving Lungs are clear Will hold off on any antibiotic Continue current medications and using inhalers regularly Call if no improvement

## 2024-02-22 NOTE — Assessment & Plan Note (Signed)
Chronic Regular exercise and healthy diet encouraged Check lipid panel, CMP Continue pravastatin 40 mg daily

## 2024-02-22 NOTE — Assessment & Plan Note (Addendum)
 Chronic Overall well controlled Continue Wixela 100-50 mcg per ACT twice daily-she is not sure if this works as well as the Symbicort  or not Discussed options-can increase dose of Wixela or try different inhaler-given her list of inhalers that she can see if are covered by her insurance Continue albuterol  inhaler as needed Continue montelukast  10 mg nightly

## 2024-02-22 NOTE — Assessment & Plan Note (Signed)
 Chronic Has seen nephrology-no obvious cause for CKD Mild Stable CMP, CBC

## 2024-02-22 NOTE — Assessment & Plan Note (Signed)
 Chronic  Clinically euthyroid Check tsh and will titrate med dose if needed Currently taking levothyroxine 50 mcg daily

## 2024-02-22 NOTE — Assessment & Plan Note (Signed)
 Chronic Lab Results  Component Value Date   HGBA1C 5.8 08/17/2023   Check a1c Low sugar / carb diet Stressed regular exercise

## 2024-02-23 ENCOUNTER — Other Ambulatory Visit (HOSPITAL_COMMUNITY): Payer: Self-pay

## 2024-02-23 ENCOUNTER — Telehealth: Payer: Self-pay

## 2024-02-23 NOTE — Telephone Encounter (Signed)
 Pharmacy Patient Advocate Encounter   Received notification from CoverMyMeds that prior authorization for (PROTOPIC) TACROLIMUS 0.3% OINTMENT is required/requested.   Insurance verification completed.   The patient is insured through [facility] .   Per test claim: PA required; PA started via CoverMyMeds. KEY [id number] . Waiting for clinical questions to populate.

## 2024-02-24 NOTE — Telephone Encounter (Signed)
 Clinical questions have been answered and PA submitted.TO PLAN. PA currently Pending.

## 2024-02-25 ENCOUNTER — Encounter: Payer: Self-pay | Admitting: Internal Medicine

## 2024-02-26 MED ORDER — TACROLIMUS 0.03 % EX OINT: TOPICAL_OINTMENT | Freq: Two times a day (BID) | CUTANEOUS | 0 refills | Status: DC | PRN

## 2024-02-28 ENCOUNTER — Ambulatory Visit (INDEPENDENT_AMBULATORY_CARE_PROVIDER_SITE_OTHER): Admitting: Obstetrics and Gynecology

## 2024-02-28 VITALS — BP 120/68 | HR 66 | Wt 179.0 lb

## 2024-02-28 DIAGNOSIS — Z01818 Encounter for other preprocedural examination: Secondary | ICD-10-CM

## 2024-02-28 DIAGNOSIS — R062 Wheezing: Secondary | ICD-10-CM

## 2024-02-28 MED ORDER — PREDNISONE 20 MG PO TABS: 20.0000 mg | ORAL_TABLET | Freq: Every day | ORAL | 0 refills | Status: DC

## 2024-02-28 MED ORDER — POLYETHYLENE GLYCOL 3350 17 GM/SCOOP PO POWD
17.0000 g | Freq: Every day | ORAL | 0 refills | Status: DC
Start: 2024-02-28 — End: 2024-04-18

## 2024-02-28 MED ORDER — OXYCODONE HCL 5 MG PO TABS: 5.0000 mg | ORAL_TABLET | ORAL | 0 refills | Status: DC | PRN

## 2024-02-28 MED ORDER — IBUPROFEN 600 MG PO TABS: 600.0000 mg | ORAL_TABLET | Freq: Four times a day (QID) | ORAL | 0 refills | Status: DC | PRN

## 2024-02-28 NOTE — Telephone Encounter (Signed)
 Pharmacy Patient Advocate Encounter  Received notification from [employer] that Prior Authorization for Tacrolimus  0.03% ointment  has been APPROVED from 02/24/2024 to 10/25/2024   PA #/Case ID/Reference #: [id number].

## 2024-02-28 NOTE — Progress Notes (Signed)
 [facility] Pre-Operative Exam  Subjective Chief Complaint: Melissa Lowe presents for a preoperative encounter.   History of Present Illness: Melissa Lowe is a 83 y.o. female who presents for preoperative visit.  She is scheduled to undergo Exam under anesthesia, Colporrhaphy for anterior repair and cystoscopy  on 03/27/24.  Her symptoms include pelvic organ prolapse, and she was was found to have Stage II anterior, Stage 0 posterior, Stage 0 apical prolapse.   Urodynamics showed: Deferred  Past Medical History:  Diagnosis Date   Allergy    Aortic atherosclerosis (HCC) 12/30/2023   Asthma    Chest pain of uncertain etiology 02/24/2023   Diverticulosis    Fibroid    HSV infection    nose   Hyperlipidemia    Hypothyroidism    IBS (irritable bowel syndrome)    PAC (premature atrial contraction) 09/22/2016   PVC (premature ventricular contraction) 02/24/2023   Vertigo      Past Surgical History:  Procedure Laterality Date   ABDOMINAL HYSTERECTOMY     2  fibroids   ANTERIOR AND POSTERIOR REPAIR N/A 01/30/2013   Posterior repair ONLY, Surgeon: Marie-Lyne Lavoie, MD;  Location: [facility];  Service: Gynecology;  Laterality: N/A;   arthoscopy Left 08/2018   L knee repair torn meniscus   CATARACT EXTRACTION     CHOLECYSTECTOMY     colon polypectomy  2001   Dr Andriette Keeling   COLONOSCOPY  12/25/2013   Tics , Dr Jule Nyhan 2020   EYE SURGERY     KNEE SURGERY Left 2020   MENISCUS REPAIR   REPAIR FASCIAL DEFECT LEG  1972   SEPTOPLASTY     THIGH FASCIOTOMY      is allergic to sulfonamide derivatives and tetracycline.   Family History  Problem Relation Age of Onset   Dementia Mother    Cancer Father 38       throat CA; pneumonia   Asthma Sister    Breast cancer Sister    Arrhythmia Sister        pacemaker for bradycardia   Stroke Maternal Grandmother        in 64s   Cancer Maternal Grandfather    Stroke Paternal Grandmother 68    Social History    Tobacco Use   Smoking status: Never   Smokeless tobacco: Never  Vaping Use   Vaping status: Never Used  Substance Use Topics   Alcohol use: Yes    Alcohol/week: 2.0 standard drinks of alcohol    Types: 2 Glasses of wine per week    Comment: once a week   Drug use: No     Review of Systems was negative for a full 10 system review except as noted in the History of Present Illness.   Current Outpatient Medications:    albuterol  (VENTOLIN  HFA) 108 (90 Base) MCG/ACT inhaler, USE 1 INHALATION BY MOUTH EVERY  6 HOURS AS NEEDED FOR WHEEZING  OR SHORTNESS OF BREATH, Disp: 36 g, Rfl: 3   ascorbic acid (VITAMIN C) 500 MG tablet, Take 500 mg by mouth daily., Disp: , Rfl:    bimatoprost (LATISSE) 0.03 % ophthalmic solution, Place into both eyes. apply evenly along the skin of the upper eyelid at base of eyelashes 3xs weekly at bedtime; repeat procedure for second eye (use a clean applicator)., Disp: , Rfl:    chlorpheniramine-HYDROcodone  (TUSSIONEX) 10-8 MG/5ML, Take 5 mLs by mouth every 12 (twelve) hours as needed., Disp: 115 mL, Rfl: 0   ELDERBERRY PO, Take  by mouth. Bucoli Liquid., Disp: , Rfl:    famotidine  (PEPCID ) 20 MG tablet, Take 40 mg by mouth daily. , Disp: , Rfl:    fluticasone  (FLONASE ) 50 MCG/ACT nasal spray, 1-2 sprays in the left nostril daily, Disp: , Rfl:    fluticasone -salmeterol (WIXELA INHUB) 100-50 MCG/ACT AEPB, Inhale 1 puff into the lungs 2 (two) times daily., Disp: 60 each, Rfl: 3   ibuprofen  (ADVIL ) 600 MG tablet, Take 1 tablet (600 mg total) by mouth every 6 (six) hours as needed., Disp: 30 tablet, Rfl: 0   levothyroxine  (SYNTHROID ) 50 MCG tablet, TAKE 1 TABLET BY MOUTH IN  THE MORNING, Disp: 90 tablet, Rfl: 3   montelukast  (SINGULAIR ) 10 MG tablet, Take 1 tablet (10 mg total) by mouth daily., Disp: 90 tablet, Rfl: 3   oxyCODONE  (OXY IR/ROXICODONE ) 5 MG immediate release tablet, Take 1 tablet (5 mg total) by mouth every 4 (four) hours as needed for severe pain (pain  score 7-10)., Disp: 15 tablet, Rfl: 0   polyethylene glycol powder (GLYCOLAX/MIRALAX) 17 GM/SCOOP powder, Take 17 g by mouth daily. Drink 17g (1 scoop) dissolved in water per day., Disp: 255 g, Rfl: 0   pravastatin  (PRAVACHOL ) 40 MG tablet, Take 1 tablet (40 mg total) by mouth at bedtime., Disp: 90 tablet, Rfl: 3   predniSONE  (DELTASONE ) 20 MG tablet, Take 1 tablet (20 mg total) by mouth daily with breakfast., Disp: 5 tablet, Rfl: 0   tacrolimus  (PROTOPIC ) 0.03 % ointment, Apply topically 2 (two) times daily as needed (rash on face)., Disp: 30 g, Rfl: 0   Vitamin D , Cholecalciferol, 25 MCG (1000 UT) CAPS, Take by mouth., Disp: , Rfl:    Objective Vitals:   02/28/24 1112  BP: 120/68  Pulse: 66    Gen: NAD CV: S1 S2 RRR Lungs: LLL base has wheezing with expiration. No other wheezing heard throughout lungs. Messaged Dr. Donnette Gal and Prednisone  20mg  x5 days sent in at her suggestion.  Abd: soft, nontender   Previous Pelvic Exam showed: POP-Q:    POP-Q   -1                                            Aa   -1                                           Ba   -5.5                                              C    2                                            Gh   5                                            Pb   6  tvl    -3                                            Ap   -3                                            Bp        Assessment/ Plan  Assessment: The patient is a 83 y.o. year old scheduled to undergo Exam under anesthesia, Colporrhaphy for anterior repair and cystoscopy. Verbal consent was obtained for these procedures.  Plan: General Surgical Consent: The patient has previously been counseled on alternative treatments, and the decision by the patient and provider was to proceed with the procedure listed above.  For all procedures, there are risks of bleeding, infection, damage to surrounding organs including but not limited  to bowel, bladder, blood vessels, ureters and nerves, and need for further surgery if an injury were to occur. These risks are all low with minimally invasive surgery.   There are risks of numbness and weakness at any body site or buttock/rectal pain.  It is possible that baseline pain can be worsened by surgery, either with or without mesh. If surgery is vaginal, there is also a low risk of possible conversion to laparoscopy or open abdominal incision where indicated. Very rare risks include blood transfusion, blood clot, heart attack, pneumonia, or death.   There is also a risk of short-term postoperative urinary retention with need to use a catheter. About half of patients need to go home from surgery with a catheter, which is then later removed in the office. The risk of long-term need for a catheter is very low. There is also a risk of worsening of overactive bladder.   Prolapse (with or without mesh): Risk factors for surgical failure  include things that put pressure on your pelvis and the surgical repair, including obesity, chronic cough, and heavy lifting or straining (including lifting children or adults, straining on the toilet, or lifting heavy objects such as furniture or anything weighing >25 lbs. Risks of recurrence is 20-30% with vaginal native tissue repair and a less than 10% with sacrocolpopexy with mesh.     We discussed consent for blood products. Risks for blood transfusion include allergic reactions, other reactions that can affect different body organs and managed accordingly, transmission of infectious diseases such as HIV or Hepatitis. However, the blood is screened. Patient consents for blood products.  Pre-operative instructions:  She was instructed to not take Aspirin /NSAIDs x 7days prior to surgery.  Antibiotic prophylaxis was ordered as indicated.  Catheter use: Patient will go home with foley if needed after post-operative voiding trial.  Post-operative instructions:   She was provided with specific post-operative instructions, including precautions and signs/symptoms for which we would recommend contacting us , in addition to daytime and after-hours contact phone numbers. This was provided on a handout.   Post-operative medications: Prescriptions for motrin , miralax, and oxycodone  were sent to her pharmacy. Discussed using ibuprofen  and tylenol  on a schedule to limit use of narcotics.   Laboratory testing:  We will check labs: As requested by anesthesia.   Preoperative clearance:  She does not require surgical clearance.    Post-operative follow-up:  A post-operative appointment will be made for  6 weeks from the date of surgery. If she needs a post-operative nurse visit for a voiding trial, that will be set up after she leaves the hospital.    Patient will call the clinic or use MyChart should anything change or any new issues arise.   Grisell Bissette G Dorethy Tomey, NP

## 2024-02-29 ENCOUNTER — Other Ambulatory Visit: Payer: Self-pay | Admitting: Obstetrics and Gynecology

## 2024-02-29 DIAGNOSIS — Z01818 Encounter for other preprocedural examination: Secondary | ICD-10-CM

## 2024-02-29 NOTE — H&P (Signed)
 [facility] H&P  Subjective Chief Complaint: Melissa Lowe presents for a preoperative encounter.   History of Present Illness: Melissa Lowe is a 83 y.o. female who presents for preoperative visit.  She is scheduled to undergo Exam under anesthesia, Colporrhaphy for anterior repair and cystoscopy  on 03/27/24.  Her symptoms include pelvic organ prolapse, and she was was found to have Stage II anterior, Stage 0 posterior, Stage 0 apical prolapse.   Urodynamics showed: Deferred  Past Medical History:  Diagnosis Date   Allergy    Aortic atherosclerosis (HCC) 12/30/2023   Asthma    Chest pain of uncertain etiology 02/24/2023   Diverticulosis    Fibroid    HSV infection    nose   Hyperlipidemia    Hypothyroidism    IBS (irritable bowel syndrome)    PAC (premature atrial contraction) 09/22/2016   PVC (premature ventricular contraction) 02/24/2023   Vertigo      Past Surgical History:  Procedure Laterality Date   ABDOMINAL HYSTERECTOMY     2  fibroids   ANTERIOR AND POSTERIOR REPAIR N/A 01/30/2013   Posterior repair ONLY, Surgeon: Marie-Lyne Lavoie, MD;  Location: [facility];  Service: Gynecology;  Laterality: N/A;   arthoscopy Left 08/2018   L knee repair torn meniscus   CATARACT EXTRACTION     CHOLECYSTECTOMY     colon polypectomy  2001   Dr Andriette Keeling   COLONOSCOPY  12/25/2013   Tics , Dr Jule Nyhan 2020   EYE SURGERY     KNEE SURGERY Left 2020   MENISCUS REPAIR   REPAIR FASCIAL DEFECT LEG  1972   SEPTOPLASTY     THIGH FASCIOTOMY      is allergic to sulfonamide derivatives and tetracycline.   Family History  Problem Relation Age of Onset   Dementia Mother    Cancer Father 62       throat CA; pneumonia   Asthma Sister    Breast cancer Sister    Arrhythmia Sister        pacemaker for bradycardia   Stroke Maternal Grandmother        in 36s   Cancer Maternal Grandfather    Stroke Paternal Grandmother 19    Social History   Tobacco Use    Smoking status: Never   Smokeless tobacco: Never  Vaping Use   Vaping status: Never Used  Substance Use Topics   Alcohol use: Yes    Alcohol/week: 2.0 standard drinks of alcohol    Types: 2 Glasses of wine per week    Comment: once a week   Drug use: No     Review of Systems was negative for a full 10 system review except as noted in the History of Present Illness.  No current facility-administered medications for this encounter.  Current Outpatient Medications:    albuterol  (VENTOLIN  HFA) 108 (90 Base) MCG/ACT inhaler, USE 1 INHALATION BY MOUTH EVERY  6 HOURS AS NEEDED FOR WHEEZING  OR SHORTNESS OF BREATH, Disp: 36 g, Rfl: 3   ascorbic acid (VITAMIN C) 500 MG tablet, Take 500 mg by mouth daily., Disp: , Rfl:    bimatoprost (LATISSE) 0.03 % ophthalmic solution, Place into both eyes. apply evenly along the skin of the upper eyelid at base of eyelashes 3xs weekly at bedtime; repeat procedure for second eye (use a clean applicator)., Disp: , Rfl:    chlorpheniramine-HYDROcodone  (TUSSIONEX) 10-8 MG/5ML, Take 5 mLs by mouth every 12 (twelve) hours as needed., Disp: 115 mL, Rfl:  0   ELDERBERRY PO, Take by mouth. [place] Liquid., Disp: , Rfl:    famotidine  (PEPCID ) 20 MG tablet, Take 40 mg by mouth daily. , Disp: , Rfl:    fluticasone  (FLONASE ) 50 MCG/ACT nasal spray, 1-2 sprays in the left nostril daily, Disp: , Rfl:    fluticasone -salmeterol (WIXELA INHUB) 100-50 MCG/ACT AEPB, Inhale 1 puff into the lungs 2 (two) times daily., Disp: 60 each, Rfl: 3   ibuprofen  (ADVIL ) 600 MG tablet, Take 1 tablet (600 mg total) by mouth every 6 (six) hours as needed., Disp: 30 tablet, Rfl: 0   levothyroxine  (SYNTHROID ) 50 MCG tablet, TAKE 1 TABLET BY MOUTH IN  THE MORNING, Disp: 90 tablet, Rfl: 3   montelukast  (SINGULAIR ) 10 MG tablet, Take 1 tablet (10 mg total) by mouth daily., Disp: 90 tablet, Rfl: 3   oxyCODONE  (OXY IR/ROXICODONE ) 5 MG immediate release tablet, Take 1 tablet (5 mg total) by mouth every 4  (four) hours as needed for severe pain (pain score 7-10)., Disp: 15 tablet, Rfl: 0   polyethylene glycol powder (GLYCOLAX/MIRALAX) 17 GM/SCOOP powder, Take 17 g by mouth daily. Drink 17g (1 scoop) dissolved in water per day., Disp: 255 g, Rfl: 0   pravastatin  (PRAVACHOL ) 40 MG tablet, Take 1 tablet (40 mg total) by mouth at bedtime., Disp: 90 tablet, Rfl: 3   predniSONE  (DELTASONE ) 20 MG tablet, Take 1 tablet (20 mg total) by mouth daily with breakfast., Disp: 5 tablet, Rfl: 0   tacrolimus  (PROTOPIC ) 0.03 % ointment, Apply topically 2 (two) times daily as needed (rash on face)., Disp: 30 g, Rfl: 0   Vitamin D , Cholecalciferol, 25 MCG (1000 UT) CAPS, Take by mouth., Disp: , Rfl:    Objective There were no vitals filed for this visit.   Gen: NAD CV: S1 S2 RRR Lungs: LLL base has wheezing with expiration. No other wheezing heard throughout lungs. Messaged Dr. Donnette Gal and Prednisone  20mg  x5 days sent in at her suggestion.  Abd: soft, nontender   Previous Pelvic Exam showed: POP-Q:    POP-Q   -1                                            Aa   -1                                           Ba   -5.5                                              C    2                                            Gh   5                                            Pb   6  tvl    -3                                            Ap   -3                                            Bp        Assessment/ Plan  Assessment: The patient is a 83 y.o. year old scheduled to undergo Exam under anesthesia, Colporrhaphy for anterior repair and cystoscopy. Verbal consent was obtained for these procedures.

## 2024-03-07 ENCOUNTER — Telehealth: Payer: Self-pay

## 2024-03-07 NOTE — Telephone Encounter (Signed)
 Patient calls with questions about her instructions on when to take her Prednisone  that was prescribed at her pre-op visit. She wants to know if she is to take it now or before surgery.

## 2024-03-08 NOTE — Telephone Encounter (Signed)
 Patient has been notified and aware to start prednisone  now and not before surgery.

## 2024-03-09 ENCOUNTER — Encounter: Payer: Self-pay | Admitting: Internal Medicine

## 2024-03-09 DIAGNOSIS — J45909 Unspecified asthma, uncomplicated: Secondary | ICD-10-CM

## 2024-03-11 MED ORDER — BUDESONIDE-FORMOTEROL FUMARATE 80-4.5 MCG/ACT IN AERO: 2.0000 | INHALATION_SPRAY | Freq: Two times a day (BID) | RESPIRATORY_TRACT | 11 refills | Status: DC

## 2024-03-15 ENCOUNTER — Encounter: Payer: Self-pay | Admitting: Obstetrics and Gynecology

## 2024-03-15 NOTE — Progress Notes (Signed)
 Per message from Dr Theodis Fiscal, cardiology: She is at low CV risk for surgery.  She is able to complete more than 4 METS of activity without symptoms and has very minimal disease that was detected on screening.

## 2024-03-16 ENCOUNTER — Encounter (HOSPITAL_COMMUNITY): Payer: Self-pay | Admitting: Obstetrics and Gynecology

## 2024-03-16 NOTE — Progress Notes (Signed)
 Spoke w/ via phone for pre-op interview--- [facility] needs dos----  NONE       Lab results------ COVID test -----patient states asymptomatic no test needed Arrive at -------0730 NPO after MN NO Solid Food.   Pre-Surgery Ensure or G2:  Med rec completed Medications to take morning of surgery ----- Wixela and Levothyroxine . Diabetic medication -----  GLP1 agonist last dose: GLP1 instructions:  Patient instructed no nail polish to be worn day of surgery Patient instructed to bring photo id and insurance card day of surgery Patient aware to have Driver (ride ) / caregiver    for 24 hours after surgery - Husband Mylinda Asa Patient Special Instructions ----- Shower with antibacterial soap. Pre-Op special Instructions -----  Patient verbalized understanding of instructions that were given at this phone interview. Patient denies chest pain, sob, fever, cough at the interview.   Pt cardiologist is Dr Theodis Fiscal, LOV 12/30/23 instructions to F/U PRN. Dr Frutoso Jing has progress note 03/15/24 states Dr Theodis Fiscal said pt appropriate risk  for pt to proceed with surgery.

## 2024-03-21 NOTE — Progress Notes (Unsigned)
 Assessment/Plan:    1.  Essential Tremor  -This is evidenced by the symmetrical nature and longstanding hx of gradually getting worse.  We discussed nature and pathophysiology.  We discussed that this can continue to gradually get worse with time.  We discussed that some medications can worsen this, as can caffeine use.  We discussed medication therapy as well as surgical therapy.  We discussed nonmedicinal therapies such as weighted gloves, weighted spoons, weighted forks, [employer], readi steadi. She asks about cala trio today but told her that she would have to fail medications first.    -I brought out weighted utensils and showed her those today and gave her the opportunity to trial them.  -We discussed details of DBS versus focused ultrasound today including the risks and benefits of each, but both of us  agreed that she did not need these interventions.             -Patient was given primidone in the past but worried about side effects that she read on the Internet, including vertigo.  Discussed with her that it is fine that she does not want to take medication right now, but would not read about side effects on the Internet related to primidone, because those were typically much higher dosages, given the seizure patients. She asked about asthma related to propranolol.  Discussed this today and last visit and she just is not interested in propranolol.  Ultimately, after a long discussion today, she is bothered by tremor but really does not want to take medication yet.  -She asked about genetic testing for essential tremor and I told her that this was not available.  Subjective:   Melissa Lowe was seen today in follow up for essential tremor.  My previous records were reviewed prior to todays visit.  Her husband is present and supplements the history.  Thus far, she has not wanted to pursue medication for tremor, although she finds tremor annoying and somewhat bothersome.  The left is  worse than the right.    ALLERGIES:   Allergies  Allergen Reactions   Sulfonamide Derivatives     Rash on inside of arms Because of a history of documented adverse serious drug reaction;Medi Alert bracelet  is recommended   Tetracycline     Severe itching on lower leg    CURRENT MEDICATIONS:  No outpatient medications have been marked as taking for the 03/23/24 encounter (Appointment) with Cliffton Spradley, Von [place]umbling, DO.      Objective:    PHYSICAL EXAMINATION:    VITALS:   There were no vitals filed for this visit.   GEN:  The patient appears stated age and is in NAD. HEENT:  Normocephalic, atraumatic.  The mucous membranes are moist. The superficial temporal arteries are without ropiness or tenderness.   Neurological examination:  Orientation:  The patient is alert and oriented x 3.   Cranial nerves: There is good facial symmetry. Extraocular muscles are intact and visual fields are full to confrontational testing. Speech is fluent and clear.  Hearing is intact to conversational tone. Tone: Tone is good throughout. Sensation: Sensation is intact to light touch touch throughout  Coordination:  The patient has no dysdiadichokinesia or dysmetria. Motor: Strength is at least antigravity x 4.    MOVEMENT EXAM: Tremor:  There is no rest tremor today.  Occasional mild head tremor in the "yes" direction.  Occasional chin tremor.  There is minimal postural tremor.  There is mild intention tremor.  Archimedes spirals look stable from last year and better than 2 years ago.  The right is better than the left (she is right-handed) I have reviewed and interpreted the following labs independently   Chemistry      Component Value Date/Time   NA 136 02/22/2024 1141   K 4.2 02/22/2024 1141   CL 103 02/22/2024 1141   CO2 29 02/22/2024 1141   BUN 9 02/22/2024 1141   CREATININE 0.85 02/22/2024 1141   CREATININE 0.97 (H) 07/09/2020 1052      Component Value Date/Time   CALCIUM 9.3 02/22/2024  1141   ALKPHOS 44 02/22/2024 1141   AST 14 02/22/2024 1141   ALT 14 02/22/2024 1141   BILITOT 0.6 02/22/2024 1141      Lab Results  Component Value Date   WBC 6.1 02/22/2024   HGB 13.2 02/22/2024   HCT 39.5 02/22/2024   MCV 90.0 02/22/2024   PLT 262.0 02/22/2024   Lab Results  Component Value Date   TSH 2.09 02/22/2024     Chemistry      Component Value Date/Time   NA 136 02/22/2024 1141   K 4.2 02/22/2024 1141   CL 103 02/22/2024 1141   CO2 29 02/22/2024 1141   BUN 9 02/22/2024 1141   CREATININE 0.85 02/22/2024 1141   CREATININE 0.97 (H) 07/09/2020 1052      Component Value Date/Time   CALCIUM 9.3 02/22/2024 1141   ALKPHOS 44 02/22/2024 1141   AST 14 02/22/2024 1141   ALT 14 02/22/2024 1141   BILITOT 0.6 02/22/2024 1141      Total time spent on today's visit was *** minutes, including both face-to-face time and nonface-to-face time.  Time included that spent on review of records (prior notes available to me/labs/imaging if pertinent), discussing treatment and goals, answering patient's questions and coordinating care.  Cc:  Colene Dauphin, MD

## 2024-03-23 ENCOUNTER — Ambulatory Visit (INDEPENDENT_AMBULATORY_CARE_PROVIDER_SITE_OTHER): Admitting: Neurology

## 2024-03-23 VITALS — BP 128/76 | HR 73 | Wt 184.4 lb

## 2024-03-23 DIAGNOSIS — G250 Essential tremor: Secondary | ICD-10-CM | POA: Diagnosis not present

## 2024-03-23 MED ORDER — PRIMIDONE 50 MG PO TABS: 50.0000 mg | ORAL_TABLET | Freq: Two times a day (BID) | ORAL | 1 refills | Status: DC

## 2024-03-23 NOTE — Patient Instructions (Signed)
 Start primidone 50 mg - 1/2 tablet at bedtime for 1 week and then increase to 1 tablet at bedtime x 1 week.  If you are doing okay, then you can stop the titration there.  If not, you can increase to 50 mg twice per day.

## 2024-03-24 ENCOUNTER — Telehealth: Payer: Self-pay | Admitting: Neurology

## 2024-03-24 ENCOUNTER — Other Ambulatory Visit: Payer: Self-pay

## 2024-03-24 DIAGNOSIS — G250 Essential tremor: Secondary | ICD-10-CM

## 2024-03-24 MED ORDER — PRIMIDONE 50 MG PO TABS
50.0000 mg | ORAL_TABLET | Freq: Two times a day (BID) | ORAL | 1 refills | Status: DC
Start: 2024-03-24 — End: 2024-04-18

## 2024-03-24 NOTE — Telephone Encounter (Signed)
 Pt. Called about Rx Primidone being filled

## 2024-03-27 ENCOUNTER — Ambulatory Visit (HOSPITAL_COMMUNITY): Admitting: Anesthesiology

## 2024-03-27 ENCOUNTER — Encounter (HOSPITAL_COMMUNITY)
Admission: RE | Disposition: A | Discharge status: Home / Self Care | Payer: Self-pay | Source: Home / Self Care | Attending: Obstetrics and Gynecology

## 2024-03-27 ENCOUNTER — Other Ambulatory Visit: Payer: Self-pay

## 2024-03-27 ENCOUNTER — Telehealth: Payer: Self-pay | Admitting: Obstetrics and Gynecology

## 2024-03-27 ENCOUNTER — Ambulatory Visit (HOSPITAL_COMMUNITY)
Admission: RE | Admit: 2024-03-27 | Discharge: 2024-03-27 | Disposition: A | Discharge status: Home / Self Care | Attending: Obstetrics and Gynecology | Admitting: Obstetrics and Gynecology

## 2024-03-27 ENCOUNTER — Encounter (HOSPITAL_COMMUNITY): Payer: Self-pay | Admitting: Obstetrics and Gynecology

## 2024-03-27 DIAGNOSIS — K219 Gastro-esophageal reflux disease without esophagitis: Secondary | ICD-10-CM | POA: Insufficient documentation

## 2024-03-27 DIAGNOSIS — J45909 Unspecified asthma, uncomplicated: Secondary | ICD-10-CM | POA: Insufficient documentation

## 2024-03-27 DIAGNOSIS — E039 Hypothyroidism, unspecified: Secondary | ICD-10-CM | POA: Diagnosis not present

## 2024-03-27 DIAGNOSIS — N8110 Cystocele, unspecified: Secondary | ICD-10-CM | POA: Diagnosis present

## 2024-03-27 DIAGNOSIS — Z01818 Encounter for other preprocedural examination: Secondary | ICD-10-CM

## 2024-03-27 HISTORY — PX: CYSTOCELE REPAIR: SHX163

## 2024-03-27 HISTORY — PX: CYSTOSCOPY: SHX5120

## 2024-03-27 SURGERY — COLPORRHAPHY, ANTERIOR, FOR CYSTOCELE REPAIR
Anesthesia: General | Site: Vagina

## 2024-03-27 MED ORDER — EPHEDRINE 5 MG/ML INJ
INTRAVENOUS | Status: AC
Start: 2024-03-27 — End: ?
  Filled 2024-03-27: qty 20

## 2024-03-27 MED ORDER — MIDAZOLAM HCL 2 MG/2ML IJ SOLN
INTRAMUSCULAR | Status: DC | PRN
  Administered 2024-03-27: 2 mg via INTRAVENOUS

## 2024-03-27 MED ORDER — FENTANYL CITRATE (PF) 100 MCG/2ML IJ SOLN: 25.0000 ug | INTRAMUSCULAR | Status: DC | PRN

## 2024-03-27 MED ORDER — FENTANYL CITRATE (PF) 250 MCG/5ML IJ SOLN
INTRAMUSCULAR | Status: AC
  Filled 2024-03-27: qty 5

## 2024-03-27 MED ORDER — ORAL CARE MOUTH RINSE: 15.0000 mL | Freq: Once | OROMUCOSAL | Status: AC

## 2024-03-27 MED ORDER — ACETAMINOPHEN 500 MG PO TABS
1000.0000 mg | ORAL_TABLET | ORAL | Status: AC
  Administered 2024-03-27: 1000 mg via ORAL

## 2024-03-27 MED ORDER — PROPOFOL 10 MG/ML IV BOLUS
INTRAVENOUS | Status: DC | PRN
  Administered 2024-03-27: 130 mg via INTRAVENOUS

## 2024-03-27 MED ORDER — DEXMEDETOMIDINE HCL IN NACL 80 MCG/20ML IV SOLN
INTRAVENOUS | Status: DC | PRN
Start: 2024-03-27 — End: 2024-03-27
  Administered 2024-03-27 (×2): 4 ug via INTRAVENOUS

## 2024-03-27 MED ORDER — MIDAZOLAM HCL 2 MG/2ML IJ SOLN
INTRAMUSCULAR | Status: AC
Start: 2024-03-27 — End: ?
  Filled 2024-03-27: qty 2

## 2024-03-27 MED ORDER — DEXAMETHASONE SODIUM PHOSPHATE 10 MG/ML IJ SOLN
INTRAMUSCULAR | Status: DC | PRN
  Administered 2024-03-27: 10 mg via INTRAVENOUS

## 2024-03-27 MED ORDER — EPHEDRINE SULFATE-NACL 50-0.9 MG/10ML-% IV SOSY
PREFILLED_SYRINGE | INTRAVENOUS | Status: DC | PRN
  Administered 2024-03-27: 5 mg via INTRAVENOUS
  Administered 2024-03-27: 10 mg via INTRAVENOUS
  Administered 2024-03-27: 5 mg via INTRAVENOUS

## 2024-03-27 MED ORDER — GABAPENTIN 300 MG PO CAPS
300.0000 mg | ORAL_CAPSULE | ORAL | Status: AC
  Administered 2024-03-27: 300 mg via ORAL

## 2024-03-27 MED ORDER — ALBUMIN HUMAN 5 % IV SOLN
INTRAVENOUS | Status: AC
  Filled 2024-03-27: qty 250

## 2024-03-27 MED ORDER — PHENAZOPYRIDINE HCL 100 MG PO TABS
200.0000 mg | ORAL_TABLET | ORAL | Status: AC
  Administered 2024-03-27: 200 mg via ORAL

## 2024-03-27 MED ORDER — LACTATED RINGERS IV SOLN: INTRAVENOUS | Status: DC

## 2024-03-27 MED ORDER — PHENYLEPHRINE 80 MCG/ML (10ML) SYRINGE FOR IV PUSH (FOR BLOOD PRESSURE SUPPORT)
PREFILLED_SYRINGE | INTRAVENOUS | Status: AC
Start: 2024-03-27 — End: ?
  Filled 2024-03-27: qty 10

## 2024-03-27 MED ORDER — ENOXAPARIN SODIUM 40 MG/0.4ML IJ SOSY
40.0000 mg | PREFILLED_SYRINGE | INTRAMUSCULAR | Status: AC
  Administered 2024-03-27: 40 mg via SUBCUTANEOUS

## 2024-03-27 MED ORDER — ONDANSETRON HCL 4 MG/2ML IJ SOLN: 4.0000 mg | Freq: Once | INTRAMUSCULAR | Status: DC | PRN

## 2024-03-27 MED ORDER — ALBUMIN HUMAN 5 % IV SOLN
12.5000 g | Freq: Once | INTRAVENOUS | Status: AC
  Administered 2024-03-27: 12.5 g via INTRAVENOUS

## 2024-03-27 MED ORDER — GABAPENTIN 300 MG PO CAPS
ORAL_CAPSULE | ORAL | Status: DC
Start: 2024-03-27 — End: 2024-03-27
  Filled 2024-03-27: qty 1

## 2024-03-27 MED ORDER — CHLORHEXIDINE GLUCONATE 0.12 % MT SOLN
OROMUCOSAL | Status: AC
  Filled 2024-03-27: qty 15

## 2024-03-27 MED ORDER — CHLORHEXIDINE GLUCONATE 0.12 % MT SOLN
15.0000 mL | Freq: Once | OROMUCOSAL | Status: AC
  Administered 2024-03-27: 15 mL via OROMUCOSAL

## 2024-03-27 MED ORDER — ONDANSETRON HCL 4 MG/2ML IJ SOLN
INTRAMUSCULAR | Status: DC | PRN
  Administered 2024-03-27: 4 mg via INTRAVENOUS

## 2024-03-27 MED ORDER — LIDOCAINE-EPINEPHRINE 1 %-1:100000 IJ SOLN
INTRAMUSCULAR | Status: DC | PRN
  Administered 2024-03-27: 10 mL

## 2024-03-27 MED ORDER — KETOROLAC TROMETHAMINE 30 MG/ML IJ SOLN
INTRAMUSCULAR | Status: DC | PRN
  Administered 2024-03-27: 30 mg via INTRAVENOUS

## 2024-03-27 MED ORDER — CEFAZOLIN SODIUM-DEXTROSE 2-4 GM/100ML-% IV SOLN
INTRAVENOUS | Status: AC
  Filled 2024-03-27: qty 100

## 2024-03-27 MED ORDER — ACETAMINOPHEN 500 MG PO TABS
ORAL_TABLET | ORAL | Status: AC
  Filled 2024-03-27: qty 2

## 2024-03-27 MED ORDER — CEFAZOLIN SODIUM-DEXTROSE 2-4 GM/100ML-% IV SOLN
2.0000 g | INTRAVENOUS | Status: AC
  Administered 2024-03-27: 2 g via INTRAVENOUS

## 2024-03-27 MED ORDER — 0.9 % SODIUM CHLORIDE (POUR BTL) OPTIME
TOPICAL | Status: DC | PRN
  Administered 2024-03-27: 1000 mL

## 2024-03-27 MED ORDER — PHENYLEPHRINE 80 MCG/ML (10ML) SYRINGE FOR IV PUSH (FOR BLOOD PRESSURE SUPPORT)
PREFILLED_SYRINGE | INTRAVENOUS | Status: DC | PRN
  Administered 2024-03-27: 80 ug via INTRAVENOUS
  Administered 2024-03-27: 160 ug via INTRAVENOUS

## 2024-03-27 MED ORDER — PHENAZOPYRIDINE HCL 100 MG PO TABS
ORAL_TABLET | ORAL | Status: DC
Start: 2024-03-27 — End: 2024-03-27
  Filled 2024-03-27: qty 2

## 2024-03-27 MED ORDER — ENOXAPARIN SODIUM 40 MG/0.4ML IJ SOSY
PREFILLED_SYRINGE | INTRAMUSCULAR | Status: AC
  Filled 2024-03-27: qty 0.4

## 2024-03-27 MED ORDER — FENTANYL CITRATE (PF) 250 MCG/5ML IJ SOLN
INTRAMUSCULAR | Status: DC | PRN
  Administered 2024-03-27: 50 ug via INTRAVENOUS

## 2024-03-27 MED ORDER — SODIUM CHLORIDE 0.9 % IR SOLN
Status: DC | PRN
  Administered 2024-03-27: 1000 mL via INTRAVESICAL

## 2024-03-27 MED ORDER — LIDOCAINE 2% (20 MG/ML) 5 ML SYRINGE
INTRAMUSCULAR | Status: DC | PRN
  Administered 2024-03-27: 70 mg via INTRAVENOUS

## 2024-03-27 SURGICAL SUPPLY — 28 items
BLADE SURG 15 STRL LF DISP TIS (BLADE) ×2 IMPLANT
COVER MAYO STAND STRL (DRAPES) ×2 IMPLANT
ELECTRODE REM PT RTRN 9FT ADLT (ELECTROSURGICAL) IMPLANT
GLOVE BIOGEL PI IND STRL 6.5 (GLOVE) ×2 IMPLANT
GLOVE ECLIPSE 6.0 STRL STRAW (GLOVE) ×2 IMPLANT
GOWN STRL REUS W/ TWL LRG LVL3 (GOWN DISPOSABLE) ×2 IMPLANT
GOWN STRL REUS W/TWL LRG LVL3 (GOWN DISPOSABLE) ×2 IMPLANT
HIBICLENS CHG 4% 4OZ BTL (MISCELLANEOUS) ×2 IMPLANT
HOLDER FOLEY CATH W/STRAP (MISCELLANEOUS) ×2 IMPLANT
IV NS 1000ML BAXH (IV SOLUTION) IMPLANT
KIT TURNOVER KIT B (KITS) ×2 IMPLANT
MANIFOLD NEPTUNE II (INSTRUMENTS) ×2 IMPLANT
NDL HYPO 22X1.5 SAFETY MO (MISCELLANEOUS) ×2 IMPLANT
NEEDLE HYPO 22X1.5 SAFETY MO (MISCELLANEOUS) ×2 IMPLANT
NS IRRIG 1000ML POUR BTL (IV SOLUTION) ×2 IMPLANT
PACK CYSTO (CUSTOM PROCEDURE TRAY) ×2 IMPLANT
PACK VAGINAL WOMENS (CUSTOM PROCEDURE TRAY) ×2 IMPLANT
PAD OB MATERNITY 11 LF (PERSONAL CARE ITEMS) ×2 IMPLANT
RETRACTOR STAY HOOK 5MM (MISCELLANEOUS) ×2 IMPLANT
SET CYSTO W/LG BORE CLAMP LF (SET/KITS/TRAYS/PACK) ×2 IMPLANT
SLEEVE SCD COMPRESS KNEE MED (STOCKING) ×2 IMPLANT
SPIKE FLUID TRANSFER (MISCELLANEOUS) IMPLANT
SURGIFLO W/THROMBIN 8M KIT (HEMOSTASIS) IMPLANT
SUT VIC AB 0 CT1 27XBRD ANTBC (SUTURE) ×2 IMPLANT
SUT VICRYL 2-0 SH 8X27 (SUTURE) ×2 IMPLANT
SYR BULB EAR ULCER 3OZ GRN STR (SYRINGE) IMPLANT
TOWEL GREEN STERILE FF (TOWEL DISPOSABLE) ×2 IMPLANT
TRAY FOLEY W/BAG SLVR 14FR LF (SET/KITS/TRAYS/PACK) ×2 IMPLANT

## 2024-03-27 NOTE — Interval H&P Note (Signed)
 History and Physical Interval Note:  03/27/2024 9:15 AM  Melissa Lowe  has presented today for surgery, with the diagnosis of anterior vaginal prolapse.  The various methods of treatment have been discussed with the patient and family. After consideration of risks, benefits and other options for treatment, the patient has consented to  Procedure(s) with comments: COLPORRHAPHY, ANTERIOR, FOR CYSTOCELE REPAIR (N/A) - Total time needed is 1 hour CYSTOSCOPY (N/A) as a surgical intervention.  The patient's history has been reviewed, patient examined, no change in status, stable for surgery.  I have reviewed the patient's chart and labs.  Questions were answered to the patient's satisfaction.     Arma Lamp

## 2024-03-27 NOTE — Op Note (Signed)
 Operative Note  Preoperative Diagnosis: anterior vaginal prolapse  Postoperative Diagnosis: same  Procedures performed:  Anterior repair, cystoscopy  Implants: none  Attending Surgeon: Ollen Beverage, MD  Anesthesia: General LMA  Findings: 1. On vaginal exam, stage II prolapse present  2. On cystoscopy, normal bladder and urethral mucosa without injury or lesion. Brisk bilateral ureteral efflux present.    Specimens: none  Estimated blood loss: 15 mL  IV fluids: 100 mL  Urine output: see flowsheet  Complications: none  Procedure in Detail: After informed consent was obtained, the patient was taken to the operating room where general anesthesia was induced. She was placed in dorsal lithotomy position, taking care to avoid any traction of the extremities and prepped and draped in the usual sterile fashion. A self-retaining lonestar retractor was placed using four elastic blue stays.  After a foley catheter was inserted into the urethra, the location of the midurethra was palpated. Two Allis clamps were along the anterior vaginal wall defect. 1% lidocaine  with epinephrine  was injected into the vaginal mucosa.  A vertical incision was made between these two Allis clamps with a 15 blade scalpel.  Allis clamps were placed along this incision and Metzenbaum scissors were used to undermine the vaginal mucosa along the incision.  The vaginal mucosa was then sharply dissected off to the vesicovaginal septum bilaterally to the level of the pubic rami.  Anterior plication of the vesicovaginal septum was then performed using plicating sutures of 2-0 Vicryl. The vaginal mucosal edges were trimmed and the incision reapproximated with 2-0 Vicryl in a running fashion.    The Foley catheter was removed.  A 70-degree cystoscope was introduced, and 360-degree inspection revealed no injury, lesion or foreign body in the bladder.  Good bilateral ureteral efflux was noted.  The bladder was drained and  the cystoscope was removed.  The Foley catheter was reinserted. The vagina was copiously irrigated and hemostasis was noted.  The patient tolerated the procedure well.  She was awakened from anesthesia and transferred to the recovery room in stable condition. All counts were correct x 2.    Arma Lamp, MD

## 2024-03-27 NOTE — Discharge Instructions (Addendum)
 POST OPERATIVE INSTRUCTIONS  General Instructions Recovery (not bed rest) will last approximately 6 weeks Walking is encouraged, but refrain from strenuous exercise/ housework/ heavy lifting. No lifting >10lbs  Nothing in the vagina- NO intercourse, tampons or douching Bathing:  Do not submerge in water (NO swimming, bath, hot tub, etc) until after your postop visit. You can shower starting the day after surgery.  No driving until you are not taking narcotic pain medicine and until your pain is well enough controlled that you can slam on the breaks or make sudden movements if needed.   Taking your medications Please take your acetaminophen  and ibuprofen  on a schedule for the first 48 hours. Take 600mg  ibuprofen , then take 500mg  acetaminophen  3 hours later, then continue to alternate ibuprofen  and acetaminophen . That way you are taking each type of medication every 6 hours. Take the prescribed narcotic (oxycodone , tramadol , etc) as needed, with a maximum being every 4 hours.  Take a stool softener daily to keep your stools soft and preventing you from straining. If you have diarrhea, you decrease your stool softener. This is explained more below. We have prescribed you Miralax .  Reasons to Call the Nurse (see last page for phone numbers) Heavy Bleeding (changing your pad every 1-2 hours) Persistent nausea/vomiting Fever (100.4 degrees or more) Incision problems (pus or other fluid coming out, redness, warmth, increased pain)  Things to Expect After Surgery Mild to Moderate pain is normal during the first day or two after surgery. If prescribed, take Ibuprofen  or Tylenol  first and use the stronger medicine for "break-through" pain. You can overlap these medicines because they work differently.   Constipation   To Prevent Constipation:  Eat a well-balanced diet including protein, grains, fresh fruit and vegetables.  Drink plenty of fluids. Walk regularly.  Depending on specific instructions  from your physician: take Miralax  daily and additionally you can add a stool softener (colace/ docusate) and fiber supplement. Continue as long as you're on pain medications.   To Treat Constipation:  If you do not have a bowel movement in 2 days after surgery, you can take 2 Tbs of Milk of Magnesia 1-2 times a day until you have a bowel movement. If diarrhea occurs, decrease the amount or stop the laxative. If no results with Milk of Magnesia, you can drink a bottle of magnesium citrate which you can purchase over the counter.  Fatigue:  This is a normal response to surgery and will improve with time.  Plan frequent rest periods throughout the day.  Gas Pain:  This is very common but can also be very painful! Drink warm liquids such as herbal teas, bouillon or soup. Walking will help you pass more gas.  Mylicon or Gas-X can be taken over the counter.  Leaking Urine:  Varying amounts of leakage may occur after surgery.  This should improve with time. Your bladder needs at least 3 months to recover from surgery. If you leak after surgery, be sure to mention this to your doctor at your post-op visit. If you were taking medications for overactive bladder prior to surgery, be sure to restart the medications immediately after surgery.  Incisions: If you have incisions on your abdomen, the skin glue will dissolve on its own over time. It is ok to gently rinse with soap and water over these incisions but do not scrub.  Catheter Approximately 50% of patients are unable to urinate after surgery and need to go home with a catheter. This allows your bladder to  rest so it can return to full function. If you go home with a catheter, the office will call to set up a voiding trial a few days after surgery. For most patients, by this visit, they are able to urinate on their own. Long term catheter use is rare.   Return to Work  As work demands and recovery times vary widely, it is hard to predict when you will want  to return to work. If you have a desk job with no strenuous physical activity, and if you would like to return sooner than generally recommended, discuss this with your provider or call our office.   Post op concerns  For non-emergent issues, please call the Urogynecology Nurse. Please leave a message and someone will contact you within one business day.  You can also send a message through MyChart.   AFTER HOURS (After 5:00 PM and on weekends):  For urgent matters that cannot wait until the next business day. Call our office [phone] and connect to the doctor on call.  Please reserve this for important issues.   **FOR ANY TRUE EMERGENCY ISSUES CALL 911 OR GO TO THE NEAREST EMERGENCY ROOM.** Please inform our office or the doctor on call of any emergency.     APPOINTMENTS: Call [phone]    Post Anesthesia Home Care Instructions  Activity: Get plenty of rest for the remainder of the day. A responsible individual must stay with you for 24 hours following the procedure.  For the next 24 hours, DO NOT: -Drive a car -Advertising copywriter -Drink alcoholic beverages -Take any medication unless instructed by your physician -Make any legal decisions or sign important papers.  Meals: Start with liquid foods such as gelatin or soup. Progress to regular foods as tolerated. Avoid greasy, spicy, heavy foods. If nausea and/or vomiting occur, drink only clear liquids until the nausea and/or vomiting subsides. Call your physician if vomiting continues.  Special Instructions/Symptoms: Your throat may feel dry or sore from the anesthesia or the breathing tube placed in your throat during surgery. If this causes discomfort, gargle with warm salt water. The discomfort should disappear within 24 hours.   No ibuprofen , Advil , Aleve, Motrin , ketorolac, meloxicam, naproxen, or other NSAIDS until after 4:30 pm today if needed. No acetaminophen /Tylenol  until after 2:30 pm today if needed.

## 2024-03-27 NOTE — Anesthesia Procedure Notes (Signed)
 Procedure Name: LMA Insertion Date/Time: 03/27/2024 9:55 AM  Performed by: Hershall Lory, CRNAPre-anesthesia Checklist: Patient identified, Emergency Drugs available, Suction available and Patient being monitored Patient Re-evaluated:Patient Re-evaluated prior to induction Oxygen Delivery Method: Circle System Utilized Preoxygenation: Pre-oxygenation with 100% oxygen Induction Type: IV induction Ventilation: Mask ventilation without difficulty LMA: LMA inserted LMA Size: 4.0 Number of attempts: 1 Airway Equipment and Method: Bite block Placement Confirmation: positive ETCO2 Tube secured with: Tape Dental Injury: Teeth and Oropharynx as per pre-operative assessment

## 2024-03-27 NOTE — Anesthesia Postprocedure Evaluation (Signed)
 Anesthesia Post Note  Patient: Melissa Lowe  Procedure(s) Performed: COLPORRHAPHY, ANTERIOR, FOR CYSTOCELE REPAIR (Vagina ) CYSTOSCOPY (Bladder)     Patient location during evaluation: PACU Anesthesia Type: General Level of consciousness: awake and alert, oriented and patient cooperative Pain management: pain level controlled Vital Signs Assessment: post-procedure vital signs reviewed and stable Respiratory status: spontaneous breathing, nonlabored ventilation and respiratory function stable Cardiovascular status: blood pressure returned to baseline and stable Postop Assessment: no apparent nausea or vomiting Anesthetic complications: no   There were no known notable events for this encounter.  Last Vitals:  Vitals:   03/27/24 1130 03/27/24 1145  BP: 106/81 (!) 109/55  Pulse: 72 67  Resp: 15 16  Temp:    SpO2: 95% 99%    Last Pain:  Vitals:   03/27/24 1145  TempSrc:   PainSc: 0-No pain                 Jacquelyne Matte

## 2024-03-27 NOTE — Telephone Encounter (Signed)
 Melissa Lowe underwent Anterior repair, cystoscopy on 03/27/24.   Melissa Lowe passed her voiding trial.  was backfilled into the bladder Voided  PVR by bladder scan was 0ml.   Melissa Lowe was discharged without a catheter. Please call her for a routine post op check. Thanks!  Arma Lamp, MD

## 2024-03-27 NOTE — Anesthesia Preprocedure Evaluation (Signed)
 Anesthesia Evaluation  Patient identified by MRN, date of birth, ID band Patient awake    Reviewed: Allergy & Precautions, H&P , NPO status , Patient's Chart, lab work & pertinent test results  History of Anesthesia Complications (+) history of anesthetic complications (per pt issues w/ bradycardia (<50bpm) in PACU after her last surgery)  Airway Mallampati: II  TM Distance: >3 FB Neck ROM: Full    Dental  (+) Teeth Intact, Dental Advisory Given   Pulmonary asthma (well controlled, uses rescue inhaler once a year)    Pulmonary exam normal breath sounds clear to auscultation       Cardiovascular negative cardio ROS Normal cardiovascular exam Rhythm:Regular Rate:Normal     Neuro/Psych negative neurological ROS  negative psych ROS   GI/Hepatic Neg liver ROS,GERD  Controlled,,  Endo/Other  Hypothyroidism    Renal/GU negative Renal ROS  negative genitourinary   Musculoskeletal negative musculoskeletal ROS (+)    Abdominal   Peds negative pediatric ROS (+)  Hematology negative hematology ROS (+)   Anesthesia Other Findings   Reproductive/Obstetrics negative OB ROS                             Anesthesia Physical Anesthesia Plan  ASA: 2  Anesthesia Plan: General   Post-op Pain Management: Tylenol  PO (pre-op)*   Induction: Intravenous  PONV Risk Score and Plan: 4 or greater and Ondansetron , Dexamethasone, Treatment may vary due to age or medical condition and Midazolam  Airway Management Planned: LMA  Additional Equipment: None  Intra-op Plan:   Post-operative Plan: Extubation in OR  Informed Consent: I have reviewed the patients History and Physical, chart, labs and discussed the procedure including the risks, benefits and alternatives for the proposed anesthesia with the patient or authorized representative who has indicated his/her understanding and acceptance.     Dental  advisory given  Plan Discussed with: CRNA  Anesthesia Plan Comments: (Very anxious, tearful in preop- would like anxiolysis prior to OR )       Anesthesia Quick Evaluation

## 2024-03-27 NOTE — Transfer of Care (Signed)
 Immediate Anesthesia Transfer of Care Note  Patient: Melissa Lowe  Procedure(s) Performed: COLPORRHAPHY, ANTERIOR, FOR CYSTOCELE REPAIR (Vagina ) CYSTOSCOPY (Bladder)  Patient Location: PACU  Anesthesia Type:General  Level of Consciousness: drowsy  Airway & Oxygen Therapy: Patient Spontanous Breathing and Patient connected to face mask oxygen  Post-op Assessment: Report given to RN  Post vital signs: Reviewed and stable  Last Vitals:  Vitals Value Taken Time  BP 127/61 03/27/24 1045  Temp 36.4 C 03/27/24 1045  Pulse 68 03/27/24 1048  Resp 15 03/27/24 1048  SpO2 97 % 03/27/24 1048  Vitals shown include unfiled device data.  Last Pain:  Vitals:   03/27/24 0813  TempSrc: Oral  PainSc: 0-No pain      Patients Stated Pain Goal: 4 (03/27/24 0813)  Complications: There were no known notable events for this encounter.

## 2024-03-28 ENCOUNTER — Encounter (HOSPITAL_COMMUNITY): Payer: Self-pay | Admitting: Obstetrics and Gynecology

## 2024-03-29 NOTE — Telephone Encounter (Signed)
 Melissa Lowe  underwent Colporrhaphy, Anterior, For Cystocele Repair and Cystoscopy  on 03/27/2024  with [x] Dr Frutoso Jing [] Dr Aron Lard.  The patient reports that her pain is controlled.  She is taking [] No Medication [x] Acetaminophen  500mg  every 6 hours [x] Ibuprofen  600mg  every 6 hours or []  Prescribed Narcotic.  Her pain level is 0[x] with medication She denies vaginal bleeding.  The patient is tolerating PO fluids and solids. She has had a bowel movement and is taking Miralax  for a bowel regimen. She is not passing gas.  She was discharged without a catheter.   [x]  Discharged without a catheter, the patient does feel as if she is emptying her bladder.  She does having any additional questions.   Patient has a concern for weight gain, on Monday the morning of the procedure she weighed 177.5lbs, today she weighed 185lbs. She has no bloating, eating and drinking well, bowels are moving and emptying her bladder well. Should she be concerned?    CC'd note to patient's provider.

## 2024-03-29 NOTE — Telephone Encounter (Signed)
 Patient has been notified. She will watch her weight and let us  know if she develops any additional symptoms.

## 2024-03-29 NOTE — Telephone Encounter (Signed)
 Likely from the prednisone  she took prior to surgery if she has no other symptoms. Please have patient let us  know if she develops any visible swelling, chest pain or SOB.

## 2024-03-30 ENCOUNTER — Encounter: Payer: Self-pay | Admitting: Internal Medicine

## 2024-04-18 ENCOUNTER — Other Ambulatory Visit: Payer: Self-pay

## 2024-04-18 ENCOUNTER — Ambulatory Visit (INDEPENDENT_AMBULATORY_CARE_PROVIDER_SITE_OTHER): Payer: Medicare Other

## 2024-04-18 ENCOUNTER — Telehealth: Payer: Self-pay | Admitting: Neurology

## 2024-04-18 DIAGNOSIS — I5030 Unspecified diastolic (congestive) heart failure: Secondary | ICD-10-CM | POA: Diagnosis not present

## 2024-04-18 DIAGNOSIS — G250 Essential tremor: Secondary | ICD-10-CM

## 2024-04-18 DIAGNOSIS — I77810 Thoracic aortic ectasia: Secondary | ICD-10-CM | POA: Diagnosis not present

## 2024-04-18 DIAGNOSIS — I088 Other rheumatic multiple valve diseases: Secondary | ICD-10-CM | POA: Diagnosis not present

## 2024-04-18 LAB — ECHOCARDIOGRAM COMPLETE
Area-P 1/2: 3.37 cm2
MV M vel: 4.5 m/s
MV Peak grad: 81 mmHg
P 1/2 time: 579 ms
Radius: 0.7 cm
S' Lateral: 2.41 cm

## 2024-04-18 MED ORDER — PRIMIDONE 50 MG PO TABS: ORAL_TABLET | ORAL | 1 refills | Status: AC

## 2024-04-18 NOTE — Telephone Encounter (Signed)
 Pt called and states that Dr Tat started her on Primidone  1/ dosage for one week (25 mg is the half dosage ) she state that her tremors are really have reduced but not gone. She states that she would like to stay on the half dosage of medication. She wants to know if that is ok to do so for as long as it seems to work for her and then once it does not work any longer then increase the dosage

## 2024-04-18 NOTE — Telephone Encounter (Signed)
 Called patient and she is doing great on 1/2 tab changed med list

## 2024-04-20 ENCOUNTER — Ambulatory Visit: Payer: Self-pay | Admitting: Cardiovascular Disease

## 2024-04-21 ENCOUNTER — Telehealth: Payer: Self-pay

## 2024-04-21 NOTE — Telephone Encounter (Unsigned)
 Copied from CRM [id number]. Topic: General - Other >> Apr 21, 2024  1:13 PM Robinson H wrote: Reason for CRM: Patient called in to schedule appointment for lower abdomen pain for a couple of months, getting worse pain that comes and goes, sometimes it's sharp. Red mole of some sore growing on back and patient declined triage. Scheduled for 6/30 at 10:20 with Dr. Geofm

## 2024-04-23 NOTE — Progress Notes (Unsigned)
    Subjective:    Patient ID: Melissa Lowe, female    DOB: Jun 02, 1941, 83 y.o.   MRN: [id number]      HPI Melissa Lowe is here for No chief complaint on file.    For abdominal pain times couple of months-pain is intermittent, but overall getting worse.  Pain is sharp in nature.   Red mole on back, growing    Medications and allergies reviewed with patient and updated if appropriate.  Current Outpatient Medications on File Prior to Visit  Medication Sig Dispense Refill   albuterol  (VENTOLIN  HFA) 108 (90 Base) MCG/ACT inhaler USE 1 INHALATION BY MOUTH EVERY  6 HOURS AS NEEDED FOR WHEEZING  OR SHORTNESS OF BREATH 36 g 3   ascorbic acid (VITAMIN C) 500 MG tablet Take 500 mg by mouth daily.     bimatoprost (LATISSE) 0.03 % ophthalmic solution Place into both eyes. apply evenly along the skin of the upper eyelid at base of eyelashes 3xs weekly at bedtime; repeat procedure for second eye (use a clean applicator).     budesonide -formoterol  (SYMBICORT ) 80-4.5 MCG/ACT inhaler Inhale 2 puffs into the lungs 2 (two) times daily. 30.6 g 11   chlorpheniramine-HYDROcodone  (TUSSIONEX) 10-8 MG/5ML Take 5 mLs by mouth every 12 (twelve) hours as needed. 115 mL 0   ELDERBERRY PO Take by mouth. Bucoli Liquid.     famotidine  (PEPCID ) 20 MG tablet Take 40 mg by mouth daily.      fluticasone  (FLONASE ) 50 MCG/ACT nasal spray 1-2 sprays in the left nostril daily     levothyroxine  (SYNTHROID ) 50 MCG tablet TAKE 1 TABLET BY MOUTH IN  THE MORNING 90 tablet 3   montelukast  (SINGULAIR ) 10 MG tablet Take 1 tablet (10 mg total) by mouth daily. 90 tablet 3   pravastatin  (PRAVACHOL ) 40 MG tablet Take 1 tablet (40 mg total) by mouth at bedtime. 90 tablet 3   primidone  (MYSOLINE ) 50 MG tablet Take 1/2 tablet at night 90 tablet 1   tacrolimus  (PROTOPIC ) 0.03 % ointment Apply topically 2 (two) times daily as needed (rash on face). 30 g 0   Vitamin D , Cholecalciferol, 25 MCG (1000 UT) CAPS Take by mouth.     No  current facility-administered medications on file prior to visit.    Review of Systems     Objective:  There were no vitals filed for this visit. BP Readings from Last 3 Encounters:  03/27/24 (!) 119/59  03/23/24 128/76  02/28/24 120/68   Wt Readings from Last 3 Encounters:  03/27/24 176 lb 8 oz (80.1 kg)  03/23/24 184 lb 6.4 oz (83.6 kg)  02/28/24 179 lb (81.2 kg)   There is no height or weight on file to calculate BMI.    Physical Exam         Assessment & Plan:    See Problem List for Assessment and Plan of chronic medical problems.

## 2024-04-24 ENCOUNTER — Ambulatory Visit: Admitting: Internal Medicine

## 2024-04-24 ENCOUNTER — Telehealth: Payer: Self-pay | Admitting: Internal Medicine

## 2024-04-24 ENCOUNTER — Ambulatory Visit
Admission: RE | Admit: 2024-04-24 | Discharge: 2024-04-24 | Disposition: A | Discharge status: Home / Self Care | Source: Ambulatory Visit | Attending: Internal Medicine | Admitting: Internal Medicine

## 2024-04-24 ENCOUNTER — Ambulatory Visit: Payer: Self-pay | Admitting: Internal Medicine

## 2024-04-24 ENCOUNTER — Encounter: Payer: Self-pay | Admitting: Internal Medicine

## 2024-04-24 VITALS — BP 110/68 | HR 65 | Temp 98.7°F | Ht 65.5 in | Wt 179.0 lb

## 2024-04-24 DIAGNOSIS — R1032 Left lower quadrant pain: Secondary | ICD-10-CM

## 2024-04-24 DIAGNOSIS — T7840XA Allergy, unspecified, initial encounter: Secondary | ICD-10-CM | POA: Insufficient documentation

## 2024-04-24 DIAGNOSIS — R1030 Lower abdominal pain, unspecified: Secondary | ICD-10-CM | POA: Insufficient documentation

## 2024-04-24 LAB — CBC WITH DIFFERENTIAL/PLATELET
Basophils Absolute: 0.1 10*3/uL (ref 0.0–0.1)
Basophils Relative: 1.5 % (ref 0.0–3.0)
Eosinophils Absolute: 0.1 10*3/uL (ref 0.0–0.7)
Eosinophils Relative: 2.3 % (ref 0.0–5.0)
HCT: 39.1 % (ref 36.0–46.0)
Hemoglobin: 13.1 g/dL (ref 12.0–15.0)
Lymphocytes Relative: 24.4 % (ref 12.0–46.0)
Lymphs Abs: 1.4 10*3/uL (ref 0.7–4.0)
MCHC: 33.5 g/dL (ref 30.0–36.0)
MCV: 87.8 fl (ref 78.0–100.0)
Monocytes Absolute: 0.5 10*3/uL (ref 0.1–1.0)
Monocytes Relative: 8.9 % (ref 3.0–12.0)
Neutro Abs: 3.6 10*3/uL (ref 1.4–7.7)
Neutrophils Relative %: 62.9 % (ref 43.0–77.0)
Platelets: 277 10*3/uL (ref 150.0–400.0)
RBC: 4.45 Mil/uL (ref 3.87–5.11)
RDW: 14.7 % (ref 11.5–15.5)
WBC: 5.8 10*3/uL (ref 4.0–10.5)

## 2024-04-24 LAB — COMPREHENSIVE METABOLIC PANEL WITH GFR
ALT: 29 U/L (ref 0–35)
AST: 24 U/L (ref 0–37)
Albumin: 4.3 g/dL (ref 3.5–5.2)
Alkaline Phosphatase: 69 U/L (ref 39–117)
BUN: 12 mg/dL (ref 6–23)
CO2: 24 meq/L (ref 19–32)
Calcium: 9.4 mg/dL (ref 8.4–10.5)
Chloride: 101 meq/L (ref 96–112)
Creatinine, Ser: 0.87 mg/dL (ref 0.40–1.20)
GFR: 62.71 mL/min (ref >60.00)
Glucose, Bld: 100 mg/dL — ABNORMAL HIGH (ref 70–99)
Potassium: 4.3 meq/L (ref 3.5–5.1)
Sodium: 134 meq/L — ABNORMAL LOW (ref 135–145)
Total Bilirubin: 0.5 mg/dL (ref 0.2–1.2)
Total Protein: 6.9 g/dL (ref 6.0–8.3)

## 2024-04-24 MED ORDER — IOPAMIDOL (ISOVUE-300) INJECTION 61%
100.0000 mL | Freq: Once | INTRAVENOUS | Status: AC | PRN
  Administered 2024-04-24: 100 mL via INTRAVENOUS

## 2024-04-24 MED ORDER — AMOXICILLIN-POT CLAVULANATE 875-125 MG PO TABS: 1.0000 | ORAL_TABLET | Freq: Two times a day (BID) | ORAL | 0 refills | Status: DC

## 2024-04-24 NOTE — Assessment & Plan Note (Signed)
 Acute Pain across lower abdomen Tender mostly in LLQ and suprapubic region, mild tenderness in LUQ Some mild rebound and guarding Loose stools - diarrhea, end of last week low grade fever and chills Has diverticulosis - concern for diverticulitis Ct of Ab/pelvis stat, cbc, cmp Uti, renal stone less likely - no urinary symptoms

## 2024-04-24 NOTE — Patient Instructions (Signed)
      Blood work was ordered.       Medications changes include :   None    A Ct scan was ordered and someone will call you to schedule an appointment.

## 2024-04-24 NOTE — Telephone Encounter (Signed)
 Changed.

## 2024-04-24 NOTE — Addendum Note (Signed)
 Addended by: GEOFM GLADE PARAS on: 04/24/2024 12:44 PM   Modules accepted: Orders

## 2024-04-24 NOTE — Telephone Encounter (Signed)
 Copied from CRM [id number]. Topic: General - Other >> Apr 24, 2024 11:39 AM Thersia C wrote: Reason for RMF:[id number] from [facility] called in regarding patient imaging  Stated that they received CT with and without contrast, per protocol doesn't need without, need to change CT abdomen and pelvis with contrast

## 2024-04-30 ENCOUNTER — Encounter: Payer: Self-pay | Admitting: Internal Medicine

## 2024-05-01 ENCOUNTER — Ambulatory Visit: Admitting: Family Medicine

## 2024-05-01 ENCOUNTER — Encounter: Payer: Self-pay | Admitting: Family Medicine

## 2024-05-01 ENCOUNTER — Ambulatory Visit: Payer: Self-pay | Admitting: Family Medicine

## 2024-05-01 VITALS — BP 122/60 | HR 87 | Temp 96.9°F | Ht 65.0 in | Wt 180.4 lb

## 2024-05-01 DIAGNOSIS — R1030 Lower abdominal pain, unspecified: Secondary | ICD-10-CM

## 2024-05-01 DIAGNOSIS — Z8719 Personal history of other diseases of the digestive system: Secondary | ICD-10-CM | POA: Diagnosis not present

## 2024-05-01 LAB — CBC WITH DIFFERENTIAL/PLATELET
Basophils Absolute: 0 K/uL (ref 0.0–0.1)
Basophils Relative: 0.6 % (ref 0.0–3.0)
Eosinophils Absolute: 0.2 K/uL (ref 0.0–0.7)
Eosinophils Relative: 2.2 % (ref 0.0–5.0)
HCT: 37.8 % (ref 36.0–46.0)
Hemoglobin: 12.7 g/dL (ref 12.0–15.0)
Lymphocytes Relative: 15.5 % (ref 12.0–46.0)
Lymphs Abs: 1.2 K/uL (ref 0.7–4.0)
MCHC: 33.7 g/dL (ref 30.0–36.0)
MCV: 87.3 fl (ref 78.0–100.0)
Monocytes Absolute: 0.7 K/uL (ref 0.1–1.0)
Monocytes Relative: 9.1 % (ref 3.0–12.0)
Neutro Abs: 5.8 K/uL (ref 1.4–7.7)
Neutrophils Relative %: 72.6 % (ref 43.0–77.0)
Platelets: 298 K/uL (ref 150.0–400.0)
RBC: 4.32 Mil/uL (ref 3.87–5.11)
RDW: 14.6 % (ref 11.5–15.5)
WBC: 8 K/uL (ref 4.0–10.5)

## 2024-05-01 LAB — URINALYSIS, ROUTINE W REFLEX MICROSCOPIC
Bilirubin Urine: NEGATIVE
Nitrite: NEGATIVE
Specific Gravity, Urine: 1.02 (ref 1.000–1.030)
Total Protein, Urine: NEGATIVE
Urine Glucose: NEGATIVE
Urobilinogen, UA: 0.2 (ref 0.0–1.0)
pH: 6 (ref 5.0–8.0)

## 2024-05-01 MED ORDER — DICYCLOMINE HCL 10 MG PO CAPS: 10.0000 mg | ORAL_CAPSULE | Freq: Three times a day (TID) | ORAL | Status: DC | PRN

## 2024-05-01 MED ORDER — DICYCLOMINE HCL 10 MG PO CAPS: 10.0000 mg | ORAL_CAPSULE | Freq: Three times a day (TID) | ORAL | 0 refills | Status: DC | PRN

## 2024-05-01 NOTE — Addendum Note (Signed)
 Addended by: BERNETA ELSIE LABOR on: 05/01/2024 02:51 PM   Modules accepted: Orders

## 2024-05-01 NOTE — Progress Notes (Addendum)
 Established Patient Office Visit   Subjective:  Patient ID: Melissa Lowe, female    DOB: [birth year]  Age: 83 y.o. MRN: [id number]  Chief Complaint  Patient presents with   Abdominal Pain    Abdominal pain. Pt was diagnosed with diverticulitis. Was prescribed augmentin  by PCP. Pt states no improvement in pain.     Abdominal Pain Pertinent negatives include no constipation, diarrhea, melena, myalgias, nausea or vomiting.   Encounter Diagnoses  Name Primary?   Lower abdominal pain Yes   History of diverticulitis   Presents with mid lower abdominal spasms.  Recent history of sigmoid diverticulitis by CT scan.  She is on her seventh day of Augmentin .  Her stool was watery but now it is semiformed.  There has been no fever or chills, blood or pus in her stool, or nausea or vomiting.  Review of Systems  Constitutional: Negative.   HENT: Negative.    Eyes:  Negative for blurred vision, discharge and redness.  Respiratory: Negative.    Cardiovascular: Negative.   Gastrointestinal:  Positive for abdominal pain. Negative for blood in stool, constipation, diarrhea, melena, nausea and vomiting.  Genitourinary: Negative.   Musculoskeletal: Negative.  Negative for myalgias.  Skin:  Negative for rash.  Neurological:  Negative for tingling, loss of consciousness and weakness.  Endo/Heme/Allergies:  Negative for polydipsia.   Female he is at the beginning he did have a few  Current Outpatient Medications:    dicyclomine  (BENTYL ) 10 MG capsule, Take 1 capsule (10 mg total) by mouth 3 (three) times daily as needed for spasms., Disp: 20 capsule, Rfl: 0   albuterol  (VENTOLIN  HFA) 108 (90 Base) MCG/ACT inhaler, USE 1 INHALATION BY MOUTH EVERY  6 HOURS AS NEEDED FOR WHEEZING  OR SHORTNESS OF BREATH, Disp: 36 g, Rfl: 3   amoxicillin -clavulanate (AUGMENTIN ) 875-125 MG tablet, Take 1 tablet by mouth 2 (two) times daily for 10 days., Disp: 20 tablet, Rfl: 0   ascorbic acid (VITAMIN C) 500 MG tablet,  Take 500 mg by mouth daily., Disp: , Rfl:    bimatoprost (LATISSE) 0.03 % ophthalmic solution, Place into both eyes. apply evenly along the skin of the upper eyelid at base of eyelashes 3xs weekly at bedtime; repeat procedure for second eye (use a clean applicator)., Disp: , Rfl:    budesonide -formoterol  (SYMBICORT ) 80-4.5 MCG/ACT inhaler, Inhale 2 puffs into the lungs 2 (two) times daily., Disp: 30.6 g, Rfl: 11   chlorpheniramine-HYDROcodone  (TUSSIONEX) 10-8 MG/5ML, Take 5 mLs by mouth every 12 (twelve) hours as needed., Disp: 115 mL, Rfl: 0   ELDERBERRY PO, Take by mouth. Bucoli Liquid., Disp: , Rfl:    famotidine  (PEPCID ) 20 MG tablet, Take 40 mg by mouth daily. , Disp: , Rfl:    fluticasone  (FLONASE ) 50 MCG/ACT nasal spray, 1-2 sprays in the left nostril daily, Disp: , Rfl:    levothyroxine  (SYNTHROID ) 50 MCG tablet, TAKE 1 TABLET BY MOUTH IN  THE MORNING, Disp: 90 tablet, Rfl: 3   montelukast  (SINGULAIR ) 10 MG tablet, Take 1 tablet (10 mg total) by mouth daily., Disp: 90 tablet, Rfl: 3   pravastatin  (PRAVACHOL ) 40 MG tablet, Take 1 tablet (40 mg total) by mouth at bedtime., Disp: 90 tablet, Rfl: 3   primidone  (MYSOLINE ) 50 MG tablet, Take 1/2 tablet at night, Disp: 90 tablet, Rfl: 1   tacrolimus  (PROTOPIC ) 0.03 % ointment, Apply topically 2 (two) times daily as needed (rash on face)., Disp: 30 g, Rfl: 0   Vitamin D , Cholecalciferol, 25  MCG (1000 UT) CAPS, Take by mouth., Disp: , Rfl:    Objective:     BP 122/60 (Cuff Size: Normal)   Pulse 87   Temp (!) 96.9 F (36.1 C) (Temporal)   Ht 5' 5 (1.651 m)   Wt 180 lb 6.4 oz (81.8 kg)   SpO2 98%   BMI 30.02 kg/m    Physical Exam Constitutional:      General: She is not in acute distress.    Appearance: Normal appearance. She is not ill-appearing, toxic-appearing or diaphoretic.  HENT:     Head: Normocephalic and atraumatic.     Right Ear: External ear normal.     Left Ear: External ear normal.     Mouth/Throat:     Mouth: Mucous  membranes are moist.     Pharynx: Oropharynx is clear. No oropharyngeal exudate or posterior oropharyngeal erythema.  Eyes:     General: No scleral icterus.       Right eye: No discharge.        Left eye: No discharge.     Extraocular Movements: Extraocular movements intact.     Conjunctiva/sclera: Conjunctivae normal.     Pupils: Pupils are equal, round, and reactive to light.  Cardiovascular:     Rate and Rhythm: Normal rate and regular rhythm.  Pulmonary:     Effort: Pulmonary effort is normal. No respiratory distress.     Breath sounds: Normal breath sounds. No wheezing, rhonchi or rales.  Abdominal:     General: Abdomen is flat. Bowel sounds are normal.     Palpations: Abdomen is soft.     Tenderness: There is abdominal tenderness in the suprapubic area. There is no guarding or rebound.     Comments: There was some tenderness to palpation in the suprapubic area.  Tenderness to palpation in the left lower quadrant was nonreproducible.  Musculoskeletal:     Cervical back: No rigidity or tenderness.  Skin:    General: Skin is warm and dry.  Neurological:     Mental Status: She is alert and oriented to person, place, and time.  Psychiatric:        Mood and Affect: Mood normal.        Behavior: Behavior normal.      No results found for any visits on 05/01/24.    The ASCVD Risk score (Arnett DK, et al., 2019) failed to calculate for the following reasons:   The 2019 ASCVD risk score is only valid for ages 83 to 12    Assessment & Plan:   Lower abdominal pain -     CBC with Differential/Platelet -     Urinalysis, Routine w reflex microscopic -     Dicyclomine  HCl; Take 1 capsule (10 mg total) by mouth 3 (three) times daily as needed for spasms.  Dispense: 20 capsule; Refill: 0  History of diverticulitis -     CBC with Differential/Platelet    Return To emergency room if not improving in the next few days..  Trial of Bentyl  for spasms.  To ER if not proving over the  next few days.  Checking CBC with differential and urinalysis.  Elsie Sim Lent, MD

## 2024-05-03 ENCOUNTER — Encounter: Payer: Self-pay | Admitting: Internal Medicine

## 2024-05-03 ENCOUNTER — Ambulatory Visit: Payer: Self-pay

## 2024-05-03 ENCOUNTER — Ambulatory Visit: Admitting: Internal Medicine

## 2024-05-03 VITALS — BP 136/72 | HR 74 | Temp 98.4°F | Resp 16 | Ht 65.0 in | Wt 179.4 lb

## 2024-05-03 DIAGNOSIS — K5792 Diverticulitis of intestine, part unspecified, without perforation or abscess without bleeding: Secondary | ICD-10-CM | POA: Diagnosis not present

## 2024-05-03 DIAGNOSIS — L270 Generalized skin eruption due to drugs and medicaments taken internally: Secondary | ICD-10-CM | POA: Diagnosis not present

## 2024-05-03 DIAGNOSIS — L508 Other urticaria: Secondary | ICD-10-CM | POA: Insufficient documentation

## 2024-05-03 DIAGNOSIS — T50915A Adverse effect of multiple unspecified drugs, medicaments and biological substances, initial encounter: Secondary | ICD-10-CM

## 2024-05-03 DIAGNOSIS — K5732 Diverticulitis of large intestine without perforation or abscess without bleeding: Secondary | ICD-10-CM | POA: Insufficient documentation

## 2024-05-03 MED ORDER — METRONIDAZOLE 250 MG PO TABS: 250.0000 mg | ORAL_TABLET | Freq: Three times a day (TID) | ORAL | 0 refills | Status: AC

## 2024-05-03 MED ORDER — HYDROXYZINE HCL 10 MG PO TABS: 10.0000 mg | ORAL_TABLET | Freq: Three times a day (TID) | ORAL | 0 refills | Status: DC | PRN

## 2024-05-03 MED ORDER — PROMETHAZINE HCL 12.5 MG PO TABS: 12.5000 mg | ORAL_TABLET | Freq: Four times a day (QID) | ORAL | 0 refills | Status: DC | PRN

## 2024-05-03 MED ORDER — CIPROFLOXACIN HCL 500 MG PO TABS: 500.0000 mg | ORAL_TABLET | Freq: Two times a day (BID) | ORAL | 0 refills | Status: AC

## 2024-05-03 MED ORDER — METHYLPREDNISOLONE ACETATE 40 MG/ML IJ SUSP
40.0000 mg | Freq: Once | INTRAMUSCULAR | Status: AC
  Administered 2024-05-03: 80 mg via INTRAMUSCULAR

## 2024-05-03 MED ORDER — METHYLPREDNISOLONE ACETATE 80 MG/ML IJ SUSP: 80.0000 mg | Freq: Once | INTRAMUSCULAR | Status: DC

## 2024-05-03 NOTE — Telephone Encounter (Signed)
 FYI Only or Action Required?: FYI only for provider.  Patient was last seen in primary care on 05/01/2024 by Berneta Elsie Sayre, MD.  Called Nurse Triage reporting Rash.  Symptoms began today.  Interventions attempted: Other: stopped taking her dicyclomine .  Symptoms are: pink/red raised bumps rash on back of legs/thigh/palms of hands/right forearm gradually worsening.  Triage Disposition: See Physician Within 24 Hours  Patient/caregiver understands and will follow disposition?: Yes                 Copied from CRM [id number]. Topic: Clinical - Red Word Triage >> May 03, 2024  8:39 AM Henretta I wrote: Red Word that prompted transfer to Nurse Triage:Patient is having severe allergic reaction to medication Reason for Disposition  Hives or itching  Answer Assessment - Initial Assessment Questions 1. APPEARANCE of RASH: Describe the rash. (e.g., spots, blisters, raised areas, skin peeling, scaly)     Raised areas/spots.   2. SIZE: How big are the spots? (e.g., tip of pen, eraser, coin; inches, centimeters)     Size of a pea and largest is a dime.  3. LOCATION: Where is the rash located?     Back of both legs, upper thighs, palms of hands, right forearm.  4. COLOR: What color is the rash? (Note: It is difficult to assess rash color in people with darker-colored skin. When this situation occurs, simply ask the caller to describe what they see.)     Pink-Red.  5. ONSET: When did the rash begin?     This morning at 0430.  6. FEVER: Do you have a fever? If Yes, ask: What is your temperature, how was it measured, and when did it start?     No.  7. ITCHING: Does the rash itch? If Yes, ask: How bad is the itch? (Scale 1-10; or mild, moderate, severe)     Yes, moderate to severe. She states it is not constant.  8. CAUSE: What do you think is causing the rash?     Patient thinks it is an allergic reaction to dicyclomine .  9. NEW MEDICINES: What new  medicines are you taking? (e.g., name of antibiotic) When did you start taking this medication?.     Dicyclomine  started on 05/01/24; Augmentin  started on 06/30 (x 1 week)  10. OTHER SYMPTOMS: Do you have any other symptoms? (e.g., sore throat, fever, joint pain)       Patient denies difficulty swallowing or breathing, facial swelling or tongue swelling. No labored breathing or wheezing noted, patient states she has had some wheezing but states she is asthmatic and this is her normal.  11. PREGNANCY: Is there any chance you are pregnant? When was your last menstrual period?       N/A.  Protocols used: Rash - Widespread On Drugs-A-AH

## 2024-05-03 NOTE — Telephone Encounter (Signed)
 FYI Only or Action Required?: Action required by provider: update on patient condition.  Patient was last seen in primary care on 05/03/2024 by Joshua Debby CROME, MD.  Called Nurse Triage reporting Medication Reaction.  Symptoms began today.  Interventions attempted: Prescription medications: hydroxyzine .  Symptoms are: gradually worsening.  Triage Disposition: Home Care  Patient/caregiver understands and will follow disposition?: No, wishes to speak with PCP  **Please see note below**                Copied from CRM #[id number]. Topic: Clinical - Red Word Triage >> May 03, 2024  4:57 PM Kevelyn M wrote: Red Word that prompted transfer to Nurse Triage: Patient called in and is having itching welts from a cortisone shot that she received in office today that was supposed to help the other allergic reaction she was having. Reason for Disposition  Mild localized rash  Answer Assessment - Initial Assessment Questions 1. APPEARANCE of RASH: What does the rash look like? (e.g., blisters, dry flaky skin, red spots, redness, sores)      She described yelts  2. LOCATION: Where is the rash located?      Right arm   3. NUMBER: How many spots are there?        4. SIZE: How big are the spots? (e.g., inches, cm; or compare to size of pinhead, tip of pen, eraser, pea)       Dime sized welts  5. ONSET: When did the rash start?      X 1 day   6. ITCHING: Does the rash itch? If Yes, ask: How bad is the itch?  (Scale 0-10; or none, mild, moderate, severe)      She rates the rash does does itch 10/10  7. PAIN: Does the rash hurt? If Yes, ask: How bad is the pain?  (Scale 0-10; or none, mild, moderate, severe)     No pain  8. OTHER SYMPTOMS: Do you have any other symptoms? (e.g., fever)     No   Patient stated she received a cortisone shot today 7/9; which was supposed to stop the reaction from the metronidazole . She stated x1 hour later she took hydroxyzine   because she began to itch really bad in her hands. She is is having welts on the inside of her right arm as well. Home care advice given, however patient would like to make provider aware, and offer any recommendations as well.  Protocols used: Rash or Redness - Localized-A-AH

## 2024-05-03 NOTE — Progress Notes (Unsigned)
 Subjective:  Patient ID: Melissa Lowe, female    DOB: 09-27-1941  Age: 83 y.o. MRN: [id number]  CC: Allergic Reaction (Patient think she's having a allergic reaction to the Bentyl  that she's taking. Her body is breaking out and she has itchy rashes all over )   HPI Melissa Lowe presents for f/up ----  Discussed the use of AI scribe software for clinical note transcription with the patient, who gave verbal consent to proceed.  History of Present Illness   Melissa Lowe is an 83 year old female who presents with severe itching and welts after starting a new medication.  She was diagnosed with diverticulitis following a CT scan and was prescribed Augmentin , taken twice daily, with three pills remaining. Despite this, her severe lower abdominal pain persisted, leading to a consultation with another physician who prescribed dicyclomine  for painful spasms.  After starting dicyclomine  on Monday evening, she developed severe itching and welts on her body, including her back and legs, with three welts noted. She also experienced itching on the palms of her hands. She has a known allergy to tetracycline and is concerned about a possible drug reaction. She has not taken any medication for the itching, such as Benadryl . She took a total of five dicyclomine  pills, two on Monday and three on Tuesday, and none today.  She has a history of asthma since age [age], with a little wheezing but no significant breathing difficulties. No swelling around her mouth or throat, and she is not coughing. She has not experienced nausea, vomiting, fever, or chills. She reports a slight burning sensation during urination this morning, which she attributes to the antibiotic.  Regarding her bowel movements, she experienced diarrhea described as 'almost pure water' on Monday and Tuesday, which was the initial reason for consulting her primary care physician. She denies any blood in her stool. This  morning, her bowel movement contained soft, small matter, indicating some improvement.       Outpatient Medications Prior to Visit  Medication Sig Dispense Refill   albuterol  (VENTOLIN  HFA) 108 (90 Base) MCG/ACT inhaler USE 1 INHALATION BY MOUTH EVERY  6 HOURS AS NEEDED FOR WHEEZING  OR SHORTNESS OF BREATH 36 g 3   ascorbic acid (VITAMIN C) 500 MG tablet Take 500 mg by mouth daily.     bimatoprost (LATISSE) 0.03 % ophthalmic solution Place into both eyes. apply evenly along the skin of the upper eyelid at base of eyelashes 3xs weekly at bedtime; repeat procedure for second eye (use a clean applicator).     budesonide -formoterol  (SYMBICORT ) 80-4.5 MCG/ACT inhaler Inhale 2 puffs into the lungs 2 (two) times daily. 30.6 g 11   ELDERBERRY PO Take by mouth. [place] Liquid.     famotidine  (PEPCID ) 20 MG tablet Take 40 mg by mouth daily.      fluticasone  (FLONASE ) 50 MCG/ACT nasal spray 1-2 sprays in the left nostril daily     levothyroxine  (SYNTHROID ) 50 MCG tablet TAKE 1 TABLET BY MOUTH IN  THE MORNING 90 tablet 3   montelukast  (SINGULAIR ) 10 MG tablet Take 1 tablet (10 mg total) by mouth daily. 90 tablet 3   pravastatin  (PRAVACHOL ) 40 MG tablet Take 1 tablet (40 mg total) by mouth at bedtime. 90 tablet 3   primidone  (MYSOLINE ) 50 MG tablet Take 1/2 tablet at night 90 tablet 1   tacrolimus  (PROTOPIC ) 0.03 % ointment Apply topically 2 (two) times daily as needed (rash on  face). 30 g 0   Vitamin D , Cholecalciferol, 25 MCG (1000 UT) CAPS Take by mouth.     amoxicillin -clavulanate (AUGMENTIN ) 875-125 MG tablet Take 1 tablet by mouth 2 (two) times daily for 10 days. 20 tablet 0   chlorpheniramine-HYDROcodone  (TUSSIONEX) 10-8 MG/5ML Take 5 mLs by mouth every 12 (twelve) hours as needed. 115 mL 0   dicyclomine  (BENTYL ) 10 MG capsule Take 1 capsule (10 mg total) by mouth 3 (three) times daily as needed for spasms. 20 capsule 0   No facility-administered medications prior to visit.    ROS Review of  Systems  Constitutional:  Negative for chills, diaphoresis, fatigue and fever.  HENT: Negative.  Negative for facial swelling, trouble swallowing and voice change.   Respiratory:  Positive for wheezing. Negative for choking, chest tightness and shortness of breath.   Cardiovascular:  Negative for chest pain, palpitations and leg swelling.  Gastrointestinal:  Positive for abdominal pain and diarrhea. Negative for constipation, nausea and vomiting.  Genitourinary: Negative.  Negative for difficulty urinating and dysuria.  Musculoskeletal: Negative.  Negative for arthralgias and myalgias.  Skin:  Positive for color change and rash.  Neurological:  Positive for tremors. Negative for dizziness, weakness, light-headedness and numbness.  Hematological:  Negative for adenopathy. Does not bruise/bleed easily.  Psychiatric/Behavioral: Negative.      Objective:  BP 136/72 (BP Location: Left Arm, Patient Position: Sitting, Cuff Size: Normal)   Pulse 74   Temp 98.4 F (36.9 C) (Oral)   Resp 16   Ht 5' 5 (1.651 m)   Wt 179 lb 6.4 oz (81.4 kg)   SpO2 96%   BMI 29.85 kg/m   BP Readings from Last 3 Encounters:  05/03/24 136/72  05/01/24 122/60  04/24/24 110/68    Wt Readings from Last 3 Encounters:  05/03/24 179 lb 6.4 oz (81.4 kg)  05/01/24 180 lb 6.4 oz (81.8 kg)  04/24/24 179 lb (81.2 kg)    Physical Exam Vitals reviewed.  Constitutional:      General: She is not in acute distress.    Appearance: She is not toxic-appearing or diaphoretic.  HENT:     Mouth/Throat:     Mouth: Mucous membranes are moist.  Eyes:     General: No scleral icterus.    Conjunctiva/sclera: Conjunctivae normal.  Cardiovascular:     Rate and Rhythm: Normal rate and regular rhythm.     Heart sounds: No murmur heard.    No friction rub. No gallop.  Pulmonary:     Effort: Pulmonary effort is normal.     Breath sounds: No stridor. No wheezing, rhonchi or rales.  Abdominal:     General: Abdomen is flat.  Bowel sounds are normal. There is no distension.     Palpations: Abdomen is soft. There is no hepatomegaly, splenomegaly or mass.     Tenderness: There is abdominal tenderness in the suprapubic area and left lower quadrant.  Musculoskeletal:     Cervical back: Neck supple.  Lymphadenopathy:     Cervical: No cervical adenopathy.  Skin:    Findings: Erythema and rash present.  Neurological:     General: No focal deficit present.     Mental Status: She is alert. Mental status is at baseline.     Motor: Tremor present.  Psychiatric:        Mood and Affect: Mood normal.        Behavior: Behavior normal.     Lab Results  Component Value Date   WBC 8.0  05/01/2024   HGB 12.7 05/01/2024   HCT 37.8 05/01/2024   PLT 298.0 05/01/2024   GLUCOSE 100 (H) 04/24/2024   CHOL 156 02/22/2024   TRIG 131.0 02/22/2024   HDL 64.30 02/22/2024   LDLDIRECT 115.0 07/05/2019   LDLCALC 66 02/22/2024   ALT 29 04/24/2024   AST 24 04/24/2024   NA 134 (L) 04/24/2024   K 4.3 04/24/2024   CL 101 04/24/2024   CREATININE 0.87 04/24/2024   BUN 12 04/24/2024   CO2 24 04/24/2024   TSH 2.09 02/22/2024   HGBA1C 5.8 02/22/2024    CT ABDOMEN PELVIS W CONTRAST Result Date: 04/24/2024 CLINICAL DATA:  Left lower quadrant abdominal pain. EXAM: CT ABDOMEN AND PELVIS WITH CONTRAST TECHNIQUE: Multidetector CT imaging of the abdomen and pelvis was performed using the standard protocol following bolus administration of intravenous contrast. RADIATION DOSE REDUCTION: This exam was performed according to the departmental dose-optimization program which includes automated exposure control, adjustment of the mA and/or kV according to patient size and/or use of iterative reconstruction technique. CONTRAST:  100mL ISOVUE -300 IOPAMIDOL  (ISOVUE -300) INJECTION 61% COMPARISON:  None Available. FINDINGS: Lower chest: The visualized lung bases are clear. No intra-abdominal free air or free fluid. Hepatobiliary: The liver is unremarkable.  No biliary dilatation. Cholecystectomy. No retained calcified stone noted in the central CBD Pancreas: Unremarkable. No pancreatic ductal dilatation or surrounding inflammatory changes. Spleen: Normal in size without focal abnormality. Adrenals/Urinary Tract: The adrenal glands are unremarkable. The kidneys, visualized ureters, and urinary bladder appear unremarkable. Stomach/Bowel: There is sigmoid diverticulosis. There is focal area of inflammatory changes and thickening of the sigmoid colon consistent with acute diverticulitis. No diverticular abscess or perforation. There is no bowel obstruction. The appendix is normal. Vascular/Lymphatic: Mild aortoiliac atherosclerotic disease. The IVC is unremarkable. No portal venous gas. There is no adenopathy. Reproductive: Hysterectomy.  No suspicious adnexal masses. Other: None Musculoskeletal: Degenerative changes of the spine. No acute osseous pathology. IMPRESSION: 1. Acute sigmoid diverticulitis. No diverticular abscess or perforation. 2.  Aortic Atherosclerosis (ICD10-I70.0). Electronically Signed   By: Vanetta Chou M.D.   On: 04/24/2024 16:17    Assessment & Plan:  Allergic drug rash due to multiple agents- Will discontinue augmentin  and dicyclomine . -     hydrOXYzine  HCl; Take 1 tablet (10 mg total) by mouth every 8 (eight) hours as needed.  Dispense: 30 tablet; Refill: 0 -     methylPREDNISolone  Acetate  Urticaria, acute -     hydrOXYzine  HCl; Take 1 tablet (10 mg total) by mouth every 8 (eight) hours as needed.  Dispense: 30 tablet; Refill: 0  Diverticulitis- I think she needs a few more days of antibiotics. -     Ciprofloxacin  HCl; Take 1 tablet (500 mg total) by mouth 2 (two) times daily for 3 days.  Dispense: 6 tablet; Refill: 0 -     metroNIDAZOLE ; Take 1 tablet (250 mg total) by mouth 3 (three) times daily for 3 days.  Dispense: 9 tablet; Refill: 0 -     Promethazine  HCl; Take 1 tablet (12.5 mg total) by mouth every 6 (six) hours as needed  for nausea or vomiting.  Dispense: 30 tablet; Refill: 0 -     oxyCODONE  HCl; Take 1 tablet (5 mg total) by mouth every 4 (four) hours as needed for severe pain (pain score 7-10).  Dispense: 10 tablet; Refill: 0     Follow-up: Return in about 2 weeks (around 05/17/2024).  Debby Molt, MD

## 2024-05-03 NOTE — Patient Instructions (Signed)
 Hives Hives are itchy, red, swollen areas on your skin. They can show up on any part of your body. They often go away within 24 hours (acute hives). If you get new hives after the old ones fade and this goes on for many days or weeks, it is called chronic hives. Hives do not spread from person to person (are not contagious). Hives can happen when your body reacts to something that you are allergic to (allergen). These are sometimes called triggers. You can get hives right after being around a trigger, or hours later. What are the causes? Food allergies. Insect bites or stings. Allergies to pollen or pets. Spending time in sunlight, heat, or cold. Exercise. Stress. Other causes, such as: Viruses. This includes the common cold. Infections caused by germs (bacteria). Some medicines. Chemicals or latex. Allergy shots. Blood transfusions. In some cases, the cause is not known. What increases the risk? Being female. Being allergic to foods, such as: Citrus fruits. Milk. Eggs. Peanuts. Tree nuts. Shellfish. Being allergic to: Medicines. Latex. Insects. Animals. Pollen. What are the signs or symptoms?  Itchy, red or white bumps or spots on your skin. These areas may: Swell and get bigger. Change in shape and location. Stand alone or connect to each other over a large area of skin. Sting or hurt. Turn white when pressed in the center (blanch). In very bad cases, your hands, feet, and face may also swell. This may happen if hives start deeper in your skin. How is this treated? Treatment for hives depends on your symptoms. You may need to: Use cool, wet cloths (cool compresses) or take cool showers to stop the itching. Take or apply medicines to: Help with itching (antihistamines). Lessen swelling (corticosteroids). Treat infection (antibiotics). Have a medicine called omalizumab given to you as a shot. You may need this if your hives do not get better with other treatments. In  very bad cases, you may need to use a device filled with medicine that gives an emergency shot of epinephrine (auto-injector pen) to stop a very bad allergic reaction (anaphylactic reaction). Follow these instructions at home: Medicines Take or apply over-the-counter and prescription medicines only as told by your doctor. If you were prescribed antibiotics, use them as told by your doctor. Do not stop using them even if you start to feel better. Skin care Put cool, wet cloths on the hives. Do not scratch your skin. Do not rub your skin. General instructions Do not take hot showers or baths. This can make itching worse. Do not wear tight clothes. Use sunscreen. Wear clothes that cover your skin when you are outside. Avoid triggers that cause your hives. Keep a journal to help track what causes your hives. Write down: What medicines you take. What you eat and drink. What you put on your skin. Keep all follow-up visits. Your doctor will need to make sure treatment is working. Contact a doctor if: Your symptoms do not get better with medicine. Your joints hurt or swell. You have a fever. You have pain in your belly (abdomen). Get help right away if: Your tongue or lips swell. Your eyelids are swollen. Your chest or throat feels tight. You have trouble breathing or swallowing. These symptoms may be an emergency. Get help right away. Call 911. Do not wait to see if the symptoms will go away. Do not drive yourself to the hospital. This information is not intended to replace advice given to you by your health care provider. Make sure  you discuss any questions you have with your health care provider. Document Revised: 06/30/2022 Document Reviewed: 06/30/2022 Elsevier Patient Education  2024 [employer].

## 2024-05-04 ENCOUNTER — Encounter: Payer: Self-pay | Admitting: Internal Medicine

## 2024-05-04 MED ORDER — OXYCODONE HCL 5 MG PO TABS
5.0000 mg | ORAL_TABLET | ORAL | 0 refills | Status: DC | PRN
Start: 2024-05-04 — End: 2024-06-23

## 2024-05-05 ENCOUNTER — Other Ambulatory Visit: Payer: Self-pay | Admitting: Internal Medicine

## 2024-05-05 DIAGNOSIS — K5792 Diverticulitis of intestine, part unspecified, without perforation or abscess without bleeding: Secondary | ICD-10-CM

## 2024-05-05 MED ORDER — HYOSCYAMINE SULFATE 0.125 MG PO TABS: 0.1250 mg | ORAL_TABLET | ORAL | 0 refills | Status: DC | PRN

## 2024-05-05 NOTE — Telephone Encounter (Signed)
This is being handled via my chart message.

## 2024-05-05 NOTE — Telephone Encounter (Signed)
 It will help with the pain but she can also try the dicyclomine  that was previously prescribed If her pain is severe then I can order a repeat scan to see if there has been a complication

## 2024-05-08 ENCOUNTER — Encounter: Admitting: Obstetrics and Gynecology

## 2024-05-09 ENCOUNTER — Ambulatory Visit (INDEPENDENT_AMBULATORY_CARE_PROVIDER_SITE_OTHER): Admitting: Obstetrics and Gynecology

## 2024-05-09 ENCOUNTER — Encounter: Payer: Self-pay | Admitting: Obstetrics and Gynecology

## 2024-05-09 VITALS — BP 114/73 | HR 66

## 2024-05-09 DIAGNOSIS — N952 Postmenopausal atrophic vaginitis: Secondary | ICD-10-CM

## 2024-05-09 DIAGNOSIS — Z9889 Other specified postprocedural states: Secondary | ICD-10-CM

## 2024-05-09 DIAGNOSIS — Z48816 Encounter for surgical aftercare following surgery on the genitourinary system: Secondary | ICD-10-CM

## 2024-05-09 MED ORDER — ESTRADIOL 0.1 MG/GM VA CREA
TOPICAL_CREAM | VAGINAL | 11 refills | Status: AC
Start: 2024-05-11 — End: ?

## 2024-05-09 NOTE — Progress Notes (Signed)
 [facility]  Date of Visit: 05/09/2024  History of Present Illness: Ms. Heckstall is a 83 y.o. female scheduled today for a post-operative visit.   Surgery: s/p anterior repair, cystoscopy on 03/27/24  She passed her postoperative void trial.   Postoperative course has been complicated by diverticulitis, treated by her PC (with augmentin  then ciprofloxacin / flagyl ).   Today she reports she has had no pain from the surgery. Has been washing internally with water. Feels something moving up when she contracts her muscles. But not feeling the bulge anymore.Normally wakes up 1-2 times per night. Has a little leakage on the way to the bathroom in the morning, but not every time. No urgency during the day. Stops drinking water about 7:30 but drinks a little with pills before bed.   UTI in the last 6 weeks? No  Pain? No  She has returned to her normal activity (except for postop restrictions) Vaginal bulge? No  Stress incontinence: No  Urgency/frequency: No  Urge incontinence: Yes - occasionally with waking only Voiding dysfunction: No  Bowel issues: No   Subjective Success: Do you usually have a bulge or something falling out that you can see or feel in the vaginal area? No  Retreatment Success: Any retreatment with surgery or pessary for any compartment? No    Medications: She has a current medication list which includes the following prescription(s): albuterol , ascorbic acid , bimatoprost, budesonide -formoterol , elderberry, [START ON 05/11/2024] estradiol , famotidine , fluticasone , hydroxyzine , hyoscyamine , levothyroxine , montelukast , oxycodone , pravastatin , primidone , tacrolimus , and vitamin d  (cholecalciferol ).   Allergies: Patient is allergic to augmentin  [amoxicillin -pot clavulanate], sulfonamide derivatives, and tetracycline.   Physical Exam: BP 114/73   Pulse 66    Pelvic Examination: slight erythema around the tissue near the urethra .Vagina: Incisions healing well.  Sutures are not present at incision line and there is not granulation tissue. No tenderness along the anterior or posterior vagina. No apical tenderness. No pelvic masses.  POP-Q: POP-Q  -2.5                                            Aa   -2.5                                           Ba  -4.5                                              C   3.5                                            Gh  5.5                                            Pb  6  tvl   -3                                            Ap  -3                                            Bp                                                 D    ---------------------------------------------------------  Assessment and Plan:  1. Post-operative state   2. Vaginal atrophy    - start vaginal estrace  cream twice a week for atrophy - She does not feel the urge incontinence is frequent enough for treatment. She will incorporate pelvic floor exercises/ kegels into her routine. She will notify us  if symptoms worsen.  - Can resume regular activity including exercise and intercourse,  if desired.  - Discussed avoidance of heavy lifting and straining long term to reduce the risk of recurrence.   All questions answered.    Rosaline LOISE Caper, MD

## 2024-05-10 ENCOUNTER — Other Ambulatory Visit: Payer: Self-pay

## 2024-05-10 ENCOUNTER — Emergency Department (HOSPITAL_COMMUNITY)

## 2024-05-10 ENCOUNTER — Encounter: Payer: Self-pay | Admitting: Internal Medicine

## 2024-05-10 ENCOUNTER — Observation Stay (HOSPITAL_COMMUNITY)
Admission: EM | Admit: 2024-05-10 | Discharge: 2024-05-13 | Disposition: A | Discharge status: Home / Self Care | Attending: Emergency Medicine | Admitting: Emergency Medicine

## 2024-05-10 ENCOUNTER — Encounter (HOSPITAL_COMMUNITY): Payer: Self-pay

## 2024-05-10 DIAGNOSIS — D72829 Elevated white blood cell count, unspecified: Secondary | ICD-10-CM | POA: Diagnosis not present

## 2024-05-10 DIAGNOSIS — R1030 Lower abdominal pain, unspecified: Secondary | ICD-10-CM | POA: Diagnosis present

## 2024-05-10 DIAGNOSIS — K5732 Diverticulitis of large intestine without perforation or abscess without bleeding: Secondary | ICD-10-CM | POA: Diagnosis not present

## 2024-05-10 DIAGNOSIS — J45909 Unspecified asthma, uncomplicated: Secondary | ICD-10-CM | POA: Insufficient documentation

## 2024-05-10 DIAGNOSIS — E039 Hypothyroidism, unspecified: Secondary | ICD-10-CM | POA: Insufficient documentation

## 2024-05-10 DIAGNOSIS — K5792 Diverticulitis of intestine, part unspecified, without perforation or abscess without bleeding: Principal | ICD-10-CM

## 2024-05-10 LAB — COMPREHENSIVE METABOLIC PANEL WITH GFR
ALT: 14 U/L (ref 0–44)
AST: 15 U/L (ref 15–41)
Albumin: 3.9 g/dL (ref 3.5–5.0)
Alkaline Phosphatase: 59 U/L (ref 38–126)
Anion gap: 10 (ref 5–15)
BUN: 10 mg/dL (ref 8–23)
CO2: 24 mmol/L (ref 22–32)
Calcium: 9.2 mg/dL (ref 8.9–10.3)
Chloride: 99 mmol/L (ref 98–111)
Creatinine, Ser: 0.85 mg/dL (ref 0.44–1.00)
GFR, Estimated: >60 mL/min (ref >60)
Glucose, Bld: 107 mg/dL — ABNORMAL HIGH (ref 70–99)
Potassium: 4.2 mmol/L (ref 3.5–5.1)
Sodium: 133 mmol/L — ABNORMAL LOW (ref 135–145)
Total Bilirubin: 0.6 mg/dL (ref 0.0–1.2)
Total Protein: 6.9 g/dL (ref 6.5–8.1)

## 2024-05-10 LAB — URINALYSIS, ROUTINE W REFLEX MICROSCOPIC
Bilirubin Urine: NEGATIVE
Glucose, UA: NEGATIVE mg/dL
Ketones, ur: NEGATIVE mg/dL
Leukocytes,Ua: NEGATIVE
Nitrite: NEGATIVE
Protein, ur: NEGATIVE mg/dL
Specific Gravity, Urine: 1.015 (ref 1.005–1.030)
pH: 5 (ref 5.0–8.0)

## 2024-05-10 LAB — CBC WITH DIFFERENTIAL/PLATELET
Abs Immature Granulocytes: 0.08 K/uL — ABNORMAL HIGH (ref 0.00–0.07)
Basophils Absolute: 0.1 K/uL (ref 0.0–0.1)
Basophils Relative: 0 %
Eosinophils Absolute: 0.1 K/uL (ref 0.0–0.5)
Eosinophils Relative: 1 %
HCT: 39.8 % (ref 36.0–46.0)
Hemoglobin: 13.1 g/dL (ref 12.0–15.0)
Immature Granulocytes: 1 %
Lymphocytes Relative: 14 %
Lymphs Abs: 2.1 K/uL (ref 0.7–4.0)
MCH: 29.3 pg (ref 26.0–34.0)
MCHC: 32.9 g/dL (ref 30.0–36.0)
MCV: 89 fL (ref 80.0–100.0)
Monocytes Absolute: 1.2 K/uL — ABNORMAL HIGH (ref 0.1–1.0)
Monocytes Relative: 9 %
Neutro Abs: 10.7 K/uL — ABNORMAL HIGH (ref 1.7–7.7)
Neutrophils Relative %: 75 %
Platelets: 368 K/uL (ref 150–400)
RBC: 4.47 MIL/uL (ref 3.87–5.11)
RDW: 13.8 % (ref 11.5–15.5)
WBC: 14.2 K/uL — ABNORMAL HIGH (ref 4.0–10.5)
nRBC: 0 % (ref 0.0–0.2)

## 2024-05-10 MED ORDER — OXYCODONE HCL 5 MG PO TABS
5.0000 mg | ORAL_TABLET | ORAL | Status: AC
  Administered 2024-05-10: 5 mg via ORAL
  Filled 2024-05-10: qty 1

## 2024-05-10 MED ORDER — IOHEXOL 350 MG/ML SOLN
75.0000 mL | Freq: Once | INTRAVENOUS | Status: AC | PRN
  Administered 2024-05-10: 75 mL via INTRAVENOUS

## 2024-05-10 NOTE — ED Provider Notes (Signed)
 [place] EMERGENCY DEPARTMENT AT [facility] Provider Note   CSN: [id number] Arrival date & time: 05/10/24  1345     Patient presents with: Abdominal Pain   Melissa Lowe is a 83 y.o. female past medical history significant for diverticulosis, asthma, IBS, and hyperlipidemia presents today for lower abdominal pain.  Patient reports she was diagnosed with diverticulitis 3 weeks ago, patient was prescribed Augmentin  on 6/30 and metronidazole  and ciprofloxacin  on 7/9.  Patient reports suprapubic pain and loose stools.  Patient denies fever, chills, nausea, vomiting, chest pain, shortness of breath, dysuria, hematuria, blood in stool, dark-colored stools, dizziness, or any other complaints at this time.  Patient reports previous cholecystectomy and hysterectomy.     Abdominal Pain      Prior to Admission medications   Medication Sig Start Date End Date Taking? Authorizing Provider  albuterol  (VENTOLIN  HFA) 108 (90 Base) MCG/ACT inhaler USE 1 INHALATION BY MOUTH EVERY  6 HOURS AS NEEDED FOR WHEEZING  OR SHORTNESS OF BREATH 11/03/23   Geofm Glade PARAS, MD  ascorbic acid  (VITAMIN C ) 500 MG tablet Take 500 mg by mouth daily.    [provider]  bimatoprost (LATISSE) 0.03 % ophthalmic solution Place into both eyes. apply evenly along the skin of the upper eyelid at base of eyelashes 3xs weekly at bedtime; repeat procedure for second eye (use a clean applicator).    [provider]  budesonide -formoterol  (SYMBICORT ) 80-4.5 MCG/ACT inhaler Inhale 2 puffs into the lungs 2 (two) times daily. 03/11/24   Geofm Glade PARAS, MD  ELDERBERRY PO Take by mouth. [place] Liquid.    [provider]  estradiol  (ESTRACE ) 0.1 MG/GM vaginal cream Place 0.5g twice a week 05/11/24   [name], MD  famotidine  (PEPCID ) 20 MG tablet Take 40 mg by mouth daily.     [provider]  fluticasone  (FLONASE ) 50 MCG/ACT nasal spray 1-2 sprays in the left nostril daily     [provider]  hydrOXYzine  (ATARAX ) 10 MG tablet Take 1 tablet (10 mg total) by mouth every 8 (eight) hours as needed. 05/03/24   Joshua Debby LITTIE, MD  hyoscyamine  (LEVSIN ) 0.125 MG tablet Take 1 tablet (0.125 mg total) by mouth every 4 (four) hours as needed. 05/05/24   Joshua Debby LITTIE, MD  levothyroxine  (SYNTHROID ) 50 MCG tablet TAKE 1 TABLET BY MOUTH IN  THE MORNING 08/17/23   Geofm Glade PARAS, MD  montelukast  (SINGULAIR ) 10 MG tablet Take 1 tablet (10 mg total) by mouth daily. 08/17/23   Geofm Glade PARAS, MD  oxyCODONE  (OXY IR/ROXICODONE ) 5 MG immediate release tablet Take 1 tablet (5 mg total) by mouth every 4 (four) hours as needed for severe pain (pain score 7-10). 05/04/24   Joshua Debby LITTIE, MD  pravastatin  (PRAVACHOL ) 40 MG tablet Take 1 tablet (40 mg total) by mouth at bedtime. 08/17/23   Geofm Glade PARAS, MD  primidone  (MYSOLINE ) 50 MG tablet Take 1/2 tablet at night 04/18/24   Tat, Rebecca S, DO  tacrolimus  (PROTOPIC ) 0.03 % ointment Apply topically 2 (two) times daily as needed (rash on face). 02/26/24   Geofm Glade PARAS, MD  Vitamin D , Cholecalciferol , 25 MCG (1000 UT) CAPS Take by mouth.    [provider]    Allergies: Augmentin  [amoxicillin -pot clavulanate], Sulfonamide derivatives, and Tetracycline    Review of Systems  Gastrointestinal:  Positive for abdominal pain.    Updated Vital Signs BP (!) 164/78 (BP Location: Right Arm)   Pulse 74  Temp 98.3 F (36.8 C) (Oral)   Resp 15   Ht 5' 5 (1.651 m)   Wt 79.4 kg   SpO2 98%   BMI 29.12 kg/m   Physical Exam Vitals and nursing note reviewed.  Constitutional:      General: She is not in acute distress.    Appearance: She is well-developed. She is not toxic-appearing or diaphoretic.  HENT:     Head: Normocephalic and atraumatic.     Mouth/Throat:     Mouth: Mucous membranes are moist.  Eyes:     Extraocular Movements: Extraocular movements intact.     Conjunctiva/sclera: Conjunctivae normal.  Cardiovascular:      Rate and Rhythm: Normal rate and regular rhythm.     Heart sounds: Normal heart sounds. No murmur heard. Pulmonary:     Effort: Pulmonary effort is normal. No respiratory distress.     Breath sounds: Normal breath sounds.  Abdominal:     General: There is no distension.     Palpations: Abdomen is soft.     Tenderness: There is abdominal tenderness in the suprapubic area.     Comments: Minimal tenderness to palpation of the suprapubic area  Musculoskeletal:        General: No swelling.     Cervical back: Neck supple.  Skin:    General: Skin is warm and dry.     Capillary Refill: Capillary refill takes less than 2 seconds.  Neurological:     General: No focal deficit present.     Mental Status: She is alert and oriented to person, place, and time.  Psychiatric:        Mood and Affect: Mood normal.     (all labs ordered are listed, but only abnormal results are displayed) Labs Reviewed  CBC WITH DIFFERENTIAL/PLATELET - Abnormal; Notable for the following components:      Result Value   WBC 14.2 (*)    Neutro Abs 10.7 (*)    Monocytes Absolute 1.2 (*)    Abs Immature Granulocytes 0.08 (*)    All other components within normal limits  COMPREHENSIVE METABOLIC PANEL WITH GFR - Abnormal; Notable for the following components:   Sodium 133 (*)    Glucose, Bld 107 (*)    All other components within normal limits  URINALYSIS, ROUTINE W REFLEX MICROSCOPIC - Abnormal; Notable for the following components:   Hgb urine dipstick SMALL (*)    Bacteria, UA RARE (*)    All other components within normal limits    EKG: None  Radiology: CT ABDOMEN PELVIS W CONTRAST Result Date: 05/10/2024 CLINICAL DATA:  Acute abdominal pain, history of recent surgery EXAM: CT ABDOMEN AND PELVIS WITH CONTRAST TECHNIQUE: Multidetector CT imaging of the abdomen and pelvis was performed using the standard protocol following bolus administration of intravenous contrast. RADIATION DOSE REDUCTION: This exam  was performed according to the departmental dose-optimization program which includes automated exposure control, adjustment of the mA and/or kV according to patient size and/or use of iterative reconstruction technique. CONTRAST:  75mL OMNIPAQUE  IOHEXOL  350 MG/ML SOLN COMPARISON:  04/24/2024 FINDINGS: Lower chest: No acute abnormality. Hepatobiliary: Gallbladder has been surgically removed. Liver is within normal limits. Pancreas: Unremarkable. No pancreatic ductal dilatation or surrounding inflammatory changes. Spleen: Normal in size without focal abnormality. Adrenals/Urinary Tract: Adrenal glands are within normal limits. Kidneys are well visualized bilaterally. No renal calculi or obstructive changes are seen. The bladder is decompressed. Stomach/Bowel: There are changes consistent with sigmoid diverticulitis somewhat progressed in the interval  from the prior exam. Again, no abscess or perforation is noted. The more proximal colon shows fecal material throughout. The appendix is within normal limits. Small bowel and stomach are within normal limits. Vascular/Lymphatic: Aortic atherosclerosis. No enlarged abdominal or pelvic lymph nodes. Reproductive: Status post hysterectomy. No adnexal masses. Other: No abdominal wall hernia or abnormality. No abdominopelvic ascites. Musculoskeletal: No acute or significant osseous findings. IMPRESSION: Changes consistent with acute uncomplicated sigmoid diverticulitis. This has progressed slightly in the interval from the prior exam. No perforation is noted. Electronically Signed   By: Oneil Devonshire M.D.   On: 05/10/2024 20:23     Procedures   Medications Ordered in the ED  ceFEPIme  (MAXIPIME ) 2 g in sodium chloride  0.9 % 100 mL IVPB (2 g Intravenous New Bag/Given 05/11/24 0104)    And  metroNIDAZOLE  (FLAGYL ) IVPB 500 mg (500 mg Intravenous New Bag/Given 05/11/24 0106)  oxyCODONE  (Oxy IR/ROXICODONE ) immediate release tablet 5 mg (5 mg Oral Given 05/10/24 1816)  iohexol   (OMNIPAQUE ) 350 MG/ML injection 75 mL (75 mLs Intravenous Contrast Given 05/10/24 2007)                                    Medical Decision Making  This patient presents to the ED for concern of diverticulitis differential diagnosis includes bowel perforation, diverticulitis, GI bleed, pancreatitis, appendicitis    Additional history obtained   Additional history obtained from Electronic Medical Record External records from outside source obtained and reviewed including internal medicine notes   Lab Tests:  I Ordered, and personally interpreted labs.  The pertinent results include: Leukocytosis at 14.2 with left shift.  UA with small hemoglobin and rare bacteria, mild hyponatremia at 133   Imaging Studies ordered:  I ordered imaging studies including CT abdomen pelvis with contrast I independently visualized and interpreted imaging which showed changes consistent with acute uncomplicated sigmoid diverticulitis.  This has progressed slightly in the interval from her previous exam.  No perforation is noted. I agree with the radiologist interpretation   Medicines ordered and prescription drug management:  I ordered medication including IV cefepime  and metronidazole     I have reviewed the patients home medicines and have made adjustments as needed   Problem List / ED Course:  Consult hospitalist, Dr. Segars who is agreeable to admission for diverticulitis with failed outpatient treatment.        Final diagnoses:  Diverticulitis    ED Discharge Orders     None          Shelaine Frie N, PA-C 05/11/24 9847    Carita Senior, MD 05/11/24 0500

## 2024-05-10 NOTE — ED Triage Notes (Signed)
 Pt to ED via triage c/o lower abdominal pain. Reports was dx with diverticulitis 3 weeks ago,

## 2024-05-10 NOTE — ED Provider Triage Note (Signed)
 Emergency Medicine Provider Triage Evaluation Note  Melissa Lowe , a 83 y.o. female  was evaluated in triage.  Pt complains of lower abdominal pain since today.  She reports that she had a bladder surgery at the beginning of June, was diagnosed with sigmoid diverticulitis at the end of June.  Did not feel any better with the Augmentin  so was given Bentyl  however developed hives.  Was taken off the Augmentin  and Bentyl  was placed on 3 days of Cipro  Flagyl .  She reports that she did start to feel better and finished the last dose on Saturday but however this morning worsened.  She reports that she has had some soft bowel movements 3 episodes but no water or firm/difficult to have a bowel movement.  Denies any nausea, vomiting, dysuria, hematuria, or any fever.  She reports that this pain was worse than any previously and was concerned.  She did take a oxycodone  previously and did help with her symptoms some.  Review of Systems  Positive:  Negative:   Physical Exam  BP (!) 146/59 (BP Location: Right Arm)   Pulse 89   Temp 98.2 F (36.8 C)   Resp 16   Ht 5' 5 (1.651 m)   Wt 79.4 kg   SpO2 100%   BMI 29.12 kg/m  Gen:   Awake, no distress   Resp:  Normal effort  MSK:   Moves extremities without difficulty  Other:  Supra pubic tenderness palpation.  Soft.  Medical Decision Making  Medically screening exam initiated at 6:04 PM.  Appropriate orders placed.  [name] was informed that the remainder of the evaluation will be completed by another provider, this initial triage assessment does not replace that evaluation, and the importance of remaining in the ED until their evaluation is complete.  CT and labs ordered.  Patient mildly elevated blood pressure otherwise hemodynamically stable.   Bernis Ernst, [place] 05/10/24 1805

## 2024-05-10 NOTE — ED Provider Notes (Incomplete)
 [place] EMERGENCY DEPARTMENT AT [facility] Provider Note   CSN: [id number] Arrival date & time: 05/10/24  1345     Patient presents with: Abdominal Pain   Melissa Lowe is a 83 y.o. female past medical history significant for diverticulosis, asthma, IBS, and hyperlipidemia presents today for lower abdominal pain.  Patient reports she was diagnosed with diverticulitis 3 weeks ago, patient was prescribed Augmentin  on 6/30 and metronidazole  and ciprofloxacin  on 7/9.  {Add pertinent medical, surgical, social history, OB history to HPI:32947}  Abdominal Pain      Prior to Admission medications   Medication Sig Start Date End Date Taking? Authorizing Provider  albuterol  (VENTOLIN  HFA) 108 (90 Base) MCG/ACT inhaler USE 1 INHALATION BY MOUTH EVERY  6 HOURS AS NEEDED FOR WHEEZING  OR SHORTNESS OF BREATH 11/03/23   Geofm Glade PARAS, MD  ascorbic acid  (VITAMIN C ) 500 MG tablet Take 500 mg by mouth daily.    [provider]  bimatoprost (LATISSE) 0.03 % ophthalmic solution Place into both eyes. apply evenly along the skin of the upper eyelid at base of eyelashes 3xs weekly at bedtime; repeat procedure for second eye (use a clean applicator).    [provider]  budesonide -formoterol  (SYMBICORT ) 80-4.5 MCG/ACT inhaler Inhale 2 puffs into the lungs 2 (two) times daily. 03/11/24   Geofm Glade PARAS, MD  ELDERBERRY PO Take by mouth. [place] Liquid.    [provider]  estradiol  (ESTRACE ) 0.1 MG/GM vaginal cream Place 0.5g twice a week 05/11/24   [name], MD  famotidine  (PEPCID ) 20 MG tablet Take 40 mg by mouth daily.     [provider]  fluticasone  (FLONASE ) 50 MCG/ACT nasal spray 1-2 sprays in the left nostril daily    [provider]  hydrOXYzine  (ATARAX ) 10 MG tablet Take 1 tablet (10 mg total) by mouth every 8 (eight) hours as needed. 05/03/24   Joshua Debby LITTIE, MD  hyoscyamine  (LEVSIN ) 0.125 MG tablet Take 1 tablet (0.125 mg total)  by mouth every 4 (four) hours as needed. 05/05/24   Joshua Debby LITTIE, MD  levothyroxine  (SYNTHROID ) 50 MCG tablet TAKE 1 TABLET BY MOUTH IN  THE MORNING 08/17/23   Geofm Glade PARAS, MD  montelukast  (SINGULAIR ) 10 MG tablet Take 1 tablet (10 mg total) by mouth daily. 08/17/23   Geofm Glade PARAS, MD  oxyCODONE  (OXY IR/ROXICODONE ) 5 MG immediate release tablet Take 1 tablet (5 mg total) by mouth every 4 (four) hours as needed for severe pain (pain score 7-10). 05/04/24   Joshua Debby LITTIE, MD  pravastatin  (PRAVACHOL ) 40 MG tablet Take 1 tablet (40 mg total) by mouth at bedtime. 08/17/23   Geofm Glade PARAS, MD  primidone  (MYSOLINE ) 50 MG tablet Take 1/2 tablet at night 04/18/24   Tat, Rebecca S, DO  tacrolimus  (PROTOPIC ) 0.03 % ointment Apply topically 2 (two) times daily as needed (rash on face). 02/26/24   Geofm Glade PARAS, MD  Vitamin D , Cholecalciferol , 25 MCG (1000 UT) CAPS Take by mouth.    [provider]    Allergies: Augmentin  [amoxicillin -pot clavulanate], Sulfonamide derivatives, and Tetracycline    Review of Systems  Gastrointestinal:  Positive for abdominal pain.    Updated Vital Signs BP (!) 143/71 (BP Location: Right Arm)   Pulse 76   Temp 98.2 F (36.8 C)   Resp 20   Ht 5' 5 (1.651 m)   Wt 79.4 kg   SpO2 97%   BMI 29.12 kg/m   Physical Exam  (  all labs ordered are listed, but only abnormal results are displayed) Labs Reviewed  CBC WITH DIFFERENTIAL/PLATELET - Abnormal; Notable for the following components:      Result Value   WBC 14.2 (*)    Neutro Abs 10.7 (*)    Monocytes Absolute 1.2 (*)    Abs Immature Granulocytes 0.08 (*)    All other components within normal limits  COMPREHENSIVE METABOLIC PANEL WITH GFR - Abnormal; Notable for the following components:   Sodium 133 (*)    Glucose, Bld 107 (*)    All other components within normal limits  URINALYSIS, ROUTINE W REFLEX MICROSCOPIC - Abnormal; Notable for the following components:   Hgb urine dipstick SMALL (*)     Bacteria, UA RARE (*)    All other components within normal limits    EKG: None  Radiology: CT ABDOMEN PELVIS W CONTRAST Result Date: 05/10/2024 CLINICAL DATA:  Acute abdominal pain, history of recent surgery EXAM: CT ABDOMEN AND PELVIS WITH CONTRAST TECHNIQUE: Multidetector CT imaging of the abdomen and pelvis was performed using the standard protocol following bolus administration of intravenous contrast. RADIATION DOSE REDUCTION: This exam was performed according to the departmental dose-optimization program which includes automated exposure control, adjustment of the mA and/or kV according to patient size and/or use of iterative reconstruction technique. CONTRAST:  75mL OMNIPAQUE  IOHEXOL  350 MG/ML SOLN COMPARISON:  04/24/2024 FINDINGS: Lower chest: No acute abnormality. Hepatobiliary: Gallbladder has been surgically removed. Liver is within normal limits. Pancreas: Unremarkable. No pancreatic ductal dilatation or surrounding inflammatory changes. Spleen: Normal in size without focal abnormality. Adrenals/Urinary Tract: Adrenal glands are within normal limits. Kidneys are well visualized bilaterally. No renal calculi or obstructive changes are seen. The bladder is decompressed. Stomach/Bowel: There are changes consistent with sigmoid diverticulitis somewhat progressed in the interval from the prior exam. Again, no abscess or perforation is noted. The more proximal colon shows fecal material throughout. The appendix is within normal limits. Small bowel and stomach are within normal limits. Vascular/Lymphatic: Aortic atherosclerosis. No enlarged abdominal or pelvic lymph nodes. Reproductive: Status post hysterectomy. No adnexal masses. Other: No abdominal wall hernia or abnormality. No abdominopelvic ascites. Musculoskeletal: No acute or significant osseous findings. IMPRESSION: Changes consistent with acute uncomplicated sigmoid diverticulitis. This has progressed slightly in the interval from the  prior exam. No perforation is noted. Electronically Signed   By: Oneil Devonshire M.D.   On: 05/10/2024 20:23    {Document cardiac monitor, telemetry assessment procedure when appropriate:32947} Procedures   Medications Ordered in the ED  oxyCODONE  (Oxy IR/ROXICODONE ) immediate release tablet 5 mg (5 mg Oral Given 05/10/24 1816)  iohexol  (OMNIPAQUE ) 350 MG/ML injection 75 mL (75 mLs Intravenous Contrast Given 05/10/24 2007)      {Click here for ABCD2, HEART and other calculators REFRESH Note before signing:1}                              Medical Decision Making  This patient presents to the ED for concern of *** differential diagnosis includes ***    Additional history obtained   Additional history obtained from Electronic Medical Record External records from outside source obtained and reviewed including internal medicine notes   Lab Tests:  I Ordered, and personally interpreted labs.  The pertinent results include: Leukocytosis at 14.2 with left shift.  UA with small hemoglobin and rare bacteria, mild hyponatremia at 133   Imaging Studies ordered:  I ordered imaging studies including CT abdomen  pelvis with contrast I independently visualized and interpreted imaging which showed changes consistent with acute uncomplicated sigmoid diverticulitis.  This has progressed slightly in the interval from her previous exam.  No perforation is noted. I agree with the radiologist interpretation   Medicines ordered and prescription drug management:  I ordered medication including ***    I have reviewed the patients home medicines and have made adjustments as needed   Problem List / ED Course:  ***     {Document critical care time when appropriate  Document review of labs and clinical decision tools ie [id number], etc  Document your independent review of radiology images and any outside records  Document your discussion with family members, caretakers and with consultants   Document social determinants of health affecting pt's care  Document your decision making why or why not admission, treatments were needed:32947:::1}   Final diagnoses:  None    ED Discharge Orders     None

## 2024-05-11 DIAGNOSIS — K5792 Diverticulitis of intestine, part unspecified, without perforation or abscess without bleeding: Secondary | ICD-10-CM | POA: Diagnosis not present

## 2024-05-11 LAB — CBC
HCT: 35.9 % — ABNORMAL LOW (ref 36.0–46.0)
HCT: 35.8 % — ABNORMAL LOW (ref 36.0–46.0)
Hemoglobin: 11.9 g/dL — ABNORMAL LOW (ref 12.0–15.0)
Hemoglobin: 11.6 g/dL — ABNORMAL LOW (ref 12.0–15.0)
MCH: 29.8 pg (ref 26.0–34.0)
MCH: 29.1 pg (ref 26.0–34.0)
MCHC: 33.1 g/dL (ref 30.0–36.0)
MCHC: 32.4 g/dL (ref 30.0–36.0)
MCV: 90 fL (ref 80.0–100.0)
MCV: 89.7 fL (ref 80.0–100.0)
Platelets: 307 K/uL (ref 150–400)
Platelets: 296 K/uL (ref 150–400)
RBC: 3.99 MIL/uL (ref 3.87–5.11)
RBC: 3.99 MIL/uL (ref 3.87–5.11)
RDW: 13.8 % (ref 11.5–15.5)
RDW: 13.8 % (ref 11.5–15.5)
WBC: 10.8 K/uL — ABNORMAL HIGH (ref 4.0–10.5)
WBC: 8.3 K/uL (ref 4.0–10.5)
nRBC: 0 % (ref 0.0–0.2)
nRBC: 0 % (ref 0.0–0.2)

## 2024-05-11 LAB — BASIC METABOLIC PANEL WITH GFR
Anion gap: 8 (ref 5–15)
BUN: 10 mg/dL (ref 8–23)
CO2: 23 mmol/L (ref 22–32)
Calcium: 8.9 mg/dL (ref 8.9–10.3)
Chloride: 101 mmol/L (ref 98–111)
Creatinine, Ser: 0.82 mg/dL (ref 0.44–1.00)
GFR, Estimated: >60 mL/min (ref >60)
Glucose, Bld: 99 mg/dL (ref 70–99)
Potassium: 4.5 mmol/L (ref 3.5–5.1)
Sodium: 132 mmol/L — ABNORMAL LOW (ref 135–145)

## 2024-05-11 LAB — CREATININE, SERUM
Creatinine, Ser: 0.78 mg/dL (ref 0.44–1.00)
GFR, Estimated: >60 mL/min (ref >60)

## 2024-05-11 LAB — MAGNESIUM: Magnesium: 2.1 mg/dL (ref 1.7–2.4)

## 2024-05-11 LAB — PHOSPHORUS: Phosphorus: 3.5 mg/dL (ref 2.5–4.6)

## 2024-05-11 MED ORDER — METRONIDAZOLE 500 MG/100ML IV SOLN
500.0000 mg | Freq: Two times a day (BID) | INTRAVENOUS | Status: DC
  Administered 2024-05-11 – 2024-05-12 (×3): 500 mg via INTRAVENOUS
  Filled 2024-05-11 (×3): qty 100

## 2024-05-11 MED ORDER — FLUTICASONE PROPIONATE 50 MCG/ACT NA SUSP
1.0000 | Freq: Every day | NASAL | Status: DC
  Administered 2024-05-12 – 2024-05-13 (×2): 1 via NASAL
  Filled 2024-05-11: qty 16

## 2024-05-11 MED ORDER — MONTELUKAST SODIUM 10 MG PO TABS
10.0000 mg | ORAL_TABLET | Freq: Every day | ORAL | Status: DC
Start: 2024-05-11 — End: 2024-05-13
  Administered 2024-05-11 – 2024-05-13 (×2): 10 mg via ORAL
  Filled 2024-05-11 (×3): qty 1

## 2024-05-11 MED ORDER — SODIUM CHLORIDE 0.9 % IV SOLN: INTRAVENOUS | Status: DC

## 2024-05-11 MED ORDER — ACETAMINOPHEN 325 MG PO TABS
650.0000 mg | ORAL_TABLET | Freq: Four times a day (QID) | ORAL | Status: DC | PRN
Start: 2024-05-11 — End: 2024-05-13

## 2024-05-11 MED ORDER — VITAMIN D 25 MCG (1000 UNIT) PO TABS
1000.0000 [IU] | ORAL_TABLET | Freq: Every day | ORAL | Status: DC
  Filled 2024-05-11 (×2): qty 1

## 2024-05-11 MED ORDER — FAMOTIDINE 20 MG PO TABS
40.0000 mg | ORAL_TABLET | Freq: Every day | ORAL | Status: DC
  Filled 2024-05-11 (×2): qty 2

## 2024-05-11 MED ORDER — FLUTICASONE FUROATE-VILANTEROL 100-25 MCG/ACT IN AEPB
1.0000 | INHALATION_SPRAY | Freq: Every day | RESPIRATORY_TRACT | Status: DC
  Filled 2024-05-11: qty 28

## 2024-05-11 MED ORDER — ALBUTEROL SULFATE (2.5 MG/3ML) 0.083% IN NEBU: 2.5000 mg | INHALATION_SOLUTION | Freq: Four times a day (QID) | RESPIRATORY_TRACT | Status: DC | PRN

## 2024-05-11 MED ORDER — PRAVASTATIN SODIUM 40 MG PO TABS
40.0000 mg | ORAL_TABLET | Freq: Every day | ORAL | Status: DC
  Administered 2024-05-11 – 2024-05-12 (×2): 40 mg via ORAL
  Filled 2024-05-11 (×2): qty 1

## 2024-05-11 MED ORDER — ACETAMINOPHEN 650 MG RE SUPP: 650.0000 mg | Freq: Four times a day (QID) | RECTAL | Status: DC | PRN

## 2024-05-11 MED ORDER — PRIMIDONE 50 MG PO TABS
25.0000 mg | ORAL_TABLET | Freq: Every day | ORAL | Status: DC
  Administered 2024-05-11 – 2024-05-12 (×2): 25 mg via ORAL
  Filled 2024-05-11 (×2): qty 0.5

## 2024-05-11 MED ORDER — ENOXAPARIN SODIUM 40 MG/0.4ML IJ SOSY
40.0000 mg | PREFILLED_SYRINGE | INTRAMUSCULAR | Status: DC
  Administered 2024-05-11: 40 mg via SUBCUTANEOUS
  Filled 2024-05-11: qty 0.4

## 2024-05-11 MED ORDER — POLYETHYLENE GLYCOL 3350 17 G PO PACK: 17.0000 g | PACK | Freq: Every day | ORAL | Status: DC | PRN

## 2024-05-11 MED ORDER — OXYCODONE HCL 5 MG PO TABS: 5.0000 mg | ORAL_TABLET | ORAL | Status: DC | PRN

## 2024-05-11 MED ORDER — SODIUM CHLORIDE 0.9 % IV SOLN
2.0000 g | INTRAVENOUS | Status: DC
  Administered 2024-05-11 – 2024-05-13 (×3): 2 g via INTRAVENOUS
  Filled 2024-05-11 (×3): qty 20

## 2024-05-11 MED ORDER — OXYCODONE HCL 5 MG PO TABS
5.0000 mg | ORAL_TABLET | ORAL | Status: DC | PRN
  Administered 2024-05-11: 5 mg via ORAL
  Filled 2024-05-11: qty 1

## 2024-05-11 MED ORDER — VITAMIN C 500 MG PO TABS
500.0000 mg | ORAL_TABLET | Freq: Every day | ORAL | Status: DC
Start: 2024-05-11 — End: 2024-05-13
  Filled 2024-05-11 (×2): qty 1

## 2024-05-11 MED ORDER — METRONIDAZOLE 500 MG/100ML IV SOLN
500.0000 mg | Freq: Once | INTRAVENOUS | Status: AC
  Administered 2024-05-11: 500 mg via INTRAVENOUS
  Filled 2024-05-11: qty 100

## 2024-05-11 MED ORDER — LACTATED RINGERS IV SOLN: INTRAVENOUS | Status: DC

## 2024-05-11 MED ORDER — LEVOTHYROXINE SODIUM 50 MCG PO TABS
50.0000 ug | ORAL_TABLET | Freq: Every day | ORAL | Status: DC
  Administered 2024-05-12 – 2024-05-13 (×2): 50 ug via ORAL
  Filled 2024-05-11 (×2): qty 1

## 2024-05-11 MED ORDER — SODIUM CHLORIDE 0.9 % IV SOLN
2.0000 g | Freq: Once | INTRAVENOUS | Status: AC
  Administered 2024-05-11: 2 g via INTRAVENOUS
  Filled 2024-05-11: qty 12.5

## 2024-05-11 NOTE — H&P (Addendum)
 History and Physical    Melissa Lowe FMW:[id number] DOB: 04/25/1941 DOA: 05/10/2024  PCP: Geofm Glade PARAS, MD  Patient coming from: home  I have personally briefly reviewed patient's old medical records in [facility]  Chief Complaint: diverticulitis  HPI: Melissa Lowe is Melissa Lowe 83 y.o. female with medical history significant of asthma, essential tremor, dyslipidemia, hypothyroidism presenting with persistent abdominal pain with Melissa Lowe diagnosis of diverticulitis.  She is Melissa Lowe detailed historian.  She notes that her symptoms started around June 26.  She had sharp shooting pain in her mid lower abdomen abdomen.  She saw her outpatient PCP on June 30.  She had Melissa Lowe CT scan that showed acute sigmoid diverticulitis.  She was started on Augmentin .  Notes that after 6-1/2 days, she was still having start, stabbing, and severe lower abdominal pain.  Her PCP was on vacation so she saw Dr. Berneta.  He added Bentyl  to her regimen and she was given return precautions.  She subsequently developed hives after 1-1/2 days on these medicines.  She saw Dr. Joshua who stopped the Augmentin  and the Bentyl , gave Melissa Lowe dose of solumedrol and prescribed Cipro  and Flagyl  for 3 days (as well as hydroxyzine ).  He notes things were okay on Saturday when she completed her antibiotic regimen.  But Wednesday morning she noted new low to moderate, steady pain.  She then developed Melissa Lowe series of severe sharp stabbing pain.  She had 3 loose stools in about 30 minutes.  As Melissa Lowe result she came to the hospital.  She denies nausea, vomiting, blood in her stool.  She did have chills.  No fevers.  Denies smoking, occasional drinking.  ED Course: Labs, imaging.  Admit to hospitalist.  Carryover.    Review of Systems: As per HPI otherwise all other systems reviewed and are negative.  Past Medical History:  Diagnosis Date   Allergy    Aortic atherosclerosis (HCC) 12/30/2023   Asthma    Chest pain of uncertain etiology 02/24/2023    Diverticulosis    Fibroid    HSV infection    nose   Hyperlipidemia    Hypothyroidism    IBS (irritable bowel syndrome)    PAC (premature atrial contraction) 09/22/2016   PVC (premature ventricular contraction) 02/24/2023   Vertigo     Past Surgical History:  Procedure Laterality Date   ABDOMINAL HYSTERECTOMY     2  fibroids   ANTERIOR AND POSTERIOR REPAIR N/[name] 01/30/2013   Posterior repair ONLY, Surgeon: Marie-Lyne Lavoie, MD;  Location: [facility];  Service: Gynecology;  Laterality: N/[name];   arthoscopy Left 08/2018   L knee repair torn meniscus   CATARACT EXTRACTION     CHOLECYSTECTOMY     colon polypectomy  2001   Dr Luis   COLONOSCOPY  12/25/2013   Tics , Dr Luis remak 2020   CYSTOCELE REPAIR N/[name] 03/27/2024   Procedure: COLPORRHAPHY, ANTERIOR, FOR CYSTOCELE REPAIR;  Surgeon: [name], MD;  Location: [facility] OR;  Service: Gynecology;  Laterality: N/[name];  Total time needed is 1 hour   CYSTOSCOPY N/[name] 03/27/2024   Procedure: CYSTOSCOPY;  Surgeon: [name], MD;  Location: [facility] OR;  Service: Gynecology;  Laterality: N/[name];   EYE SURGERY     KNEE SURGERY Left 2020   MENISCUS REPAIR   REPAIR FASCIAL DEFECT LEG  1972   SEPTOPLASTY     THIGH FASCIOTOMY      Social History  reports that she has never smoked. She has never used smokeless  tobacco. She reports current alcohol use of about 2.0 standard drinks of alcohol per week. She reports that she does not use drugs.  Allergies  Allergen Reactions   Augmentin  [Amoxicillin -Pot Clavulanate] Hives   Sulfonamide Derivatives     Rash on inside of arms Because of [name] history of documented adverse serious drug reaction;Medi Alert bracelet  is recommended   Tetracycline     Severe itching on lower leg   Dicyclomine  Hives    Family History  Problem Relation Age of Onset   Dementia Mother    Cancer Father 46       throat CA; pneumonia   Asthma Sister    Breast cancer Sister    Arrhythmia Sister        pacemaker for  bradycardia   Stroke Maternal Grandmother        in 89s   Cancer Maternal Grandfather    Stroke Paternal Grandmother 46     Prior to Admission medications   Medication Sig Start Date End Date Taking? Authorizing Provider  ascorbic acid  (VITAMIN C ) 500 MG tablet Take 500 mg by mouth daily.   Yes [provider]  budesonide -formoterol  (SYMBICORT ) 80-4.5 MCG/ACT inhaler Inhale 2 puffs into the lungs 2 (two) times daily. Patient taking differently: Inhale 1 puff into the lungs daily. 03/11/24  Yes Burns, Glade PARAS, MD  ELDERBERRY PO Take 2 tablets by mouth daily.   Yes [provider]  famotidine  (PEPCID ) 20 MG tablet Take 40 mg by mouth daily.    Yes [provider]  fluticasone  (FLONASE ) 50 MCG/ACT nasal spray Place 1 spray into both nostrils daily. 1-2 sprays in the left nostril daily   Yes [provider]  hydrOXYzine  (ATARAX ) 10 MG tablet Take 1 tablet (10 mg total) by mouth every 8 (eight) hours as needed. 05/03/24  Yes Joshua Debby CROME, MD  levothyroxine  (SYNTHROID ) 50 MCG tablet TAKE 1 TABLET BY MOUTH IN  THE MORNING 08/17/23  Yes Burns, Glade PARAS, MD  montelukast  (SINGULAIR ) 10 MG tablet Take 1 tablet (10 mg total) by mouth daily. 08/17/23  Yes Burns, Glade PARAS, MD  oxyCODONE  (OXY IR/ROXICODONE ) 5 MG immediate release tablet Take 1 tablet (5 mg total) by mouth every 4 (four) hours as needed for severe pain (pain score 7-10). 05/04/24  Yes Joshua Debby CROME, MD  pravastatin  (PRAVACHOL ) 40 MG tablet Take 1 tablet (40 mg total) by mouth at bedtime. 08/17/23  Yes Burns, Glade PARAS, MD  primidone  (MYSOLINE ) 50 MG tablet Take 1/2 tablet at night 04/18/24  Yes Tat, Asberry RAMAN, DO  Vitamin D , Cholecalciferol , 25 MCG (1000 UT) CAPS Take 1,000 Units by mouth daily.   Yes [provider]  albuterol  (VENTOLIN  HFA) 108 (90 Base) MCG/ACT inhaler USE 1 INHALATION BY MOUTH EVERY  6 HOURS AS NEEDED FOR WHEEZING  OR SHORTNESS OF BREATH Patient not taking: Reported on 05/11/2024  11/03/23   Geofm Glade PARAS, MD  bimatoprost (LATISSE) 0.03 % ophthalmic solution Place into both eyes. apply evenly along the skin of the upper eyelid at base of eyelashes 3xs weekly at bedtime; repeat procedure for second eye (use Chesney Klimaszewski clean applicator). Patient not taking: Reported on 05/11/2024    [provider]  estradiol  (ESTRACE ) 0.1 MG/GM vaginal cream Place 0.5g twice Zale Marcotte week Patient not taking: Reported on 05/11/2024 05/11/24   [name], MD  hyoscyamine  (LEVSIN ) 0.125 MG tablet Take 1 tablet (0.125 mg total) by mouth every 4 (four) hours as needed. Patient not taking:  Reported on 05/11/2024 05/05/24   Joshua Debby CROME, MD  tacrolimus  (PROTOPIC ) 0.03 % ointment Apply topically 2 (two) times daily as needed (rash on face). Patient not taking: Reported on 05/11/2024 02/26/24   Geofm Glade PARAS, MD    Physical Exam: Vitals:   05/10/24 1850 05/11/24 0025 05/11/24 0617 05/11/24 0642  BP: (!) 143/71 (!) 164/78 133/71   Pulse: 76 74 74   Resp: 20 15 18    Temp: 98.2 F (36.8 C) 98.3 F (36.8 C)  98.1 F (36.7 C)  TempSrc:  Oral  Oral  SpO2: 97% 98% 97%   Weight:      Height:        Constitutional: NAD, calm, comfortable Vitals:   05/10/24 1850 05/11/24 0025 05/11/24 0617 05/11/24 0642  BP: (!) 143/71 (!) 164/78 133/71   Pulse: 76 74 74   Resp: 20 15 18    Temp: 98.2 F (36.8 C) 98.3 F (36.8 C)  98.1 F (36.7 C)  TempSrc:  Oral  Oral  SpO2: 97% 98% 97%   Weight:      Height:       Eyes: PERRL, lids and conjunctivae normal ENMT: Mucous membranes are moist.  Neck: normal, supple Respiratory: clear to auscultation bilaterally, no wheezing, no crackles. Normal respiratory effort. No accessory muscle use.  Cardiovascular: Regular rate and rhythm, no murmurs / rubs / gallops. No extremity edema.  Abdomen: very mild suprapubic TTP - no rebound or guarding Musculoskeletal: no clubbing / cyanosis. No joint deformity upper and lower extremities. Good ROM, no contractures.  Normal muscle tone.  Skin: no rashes, lesions, ulcers. No induration Neurologic: CN 2-12 grossly intact. Sensation intact, DTR normal. Strength 5/5 in all 4.  Psychiatric: Normal judgment and insight. Alert and oriented x 3. Normal mood.   Labs on Admission: I have personally reviewed following labs and imaging studies  CBC: Recent Labs  Lab 05/10/24 1805 05/11/24 0301  WBC 14.2* 10.8*  NEUTROABS 10.7*  --   HGB 13.1 11.9*  HCT 39.8 35.9*  MCV 89.0 90.0  PLT 368 307    Basic Metabolic Panel: Recent Labs  Lab 05/10/24 1805 05/11/24 0301  NA 133* 132*  K 4.2 4.5  CL 99 101  CO2 24 23  GLUCOSE 107* 99  BUN 10 10  CREATININE 0.85 0.82  CALCIUM 9.2 8.9  MG  --  2.1  PHOS  --  3.5    GFR: Estimated Creatinine Clearance: 57 mL/min (by C-G formula based on SCr of 0.82 mg/dL).  Liver Function Tests: Recent Labs  Lab 05/10/24 1805  AST 15  ALT 14  ALKPHOS 59  BILITOT 0.6  PROT 6.9  ALBUMIN  3.9    Urine analysis:    Component Value Date/Time   COLORURINE YELLOW 05/10/2024 1805   APPEARANCEUR CLEAR 05/10/2024 1805   LABSPEC 1.015 05/10/2024 1805   PHURINE 5.0 05/10/2024 1805   GLUCOSEU NEGATIVE 05/10/2024 1805   GLUCOSEU NEGATIVE 05/01/2024 1429   HGBUR SMALL (Lazar Tierce) 05/10/2024 1805   HGBUR large 01/01/2011 1141   BILIRUBINUR NEGATIVE 05/10/2024 1805   BILIRUBINUR Negative 12/28/2023 1033   KETONESUR NEGATIVE 05/10/2024 1805   PROTEINUR NEGATIVE 05/10/2024 1805   UROBILINOGEN 0.2 05/01/2024 1429   NITRITE NEGATIVE 05/10/2024 1805   LEUKOCYTESUR NEGATIVE 05/10/2024 1805    Radiological Exams on Admission: CT ABDOMEN PELVIS W CONTRAST Result Date: 05/10/2024 CLINICAL DATA:  Acute abdominal pain, history of recent surgery EXAM: CT ABDOMEN AND PELVIS WITH CONTRAST TECHNIQUE: Multidetector CT imaging of the  abdomen and pelvis was performed using the standard protocol following bolus administration of intravenous contrast. RADIATION DOSE REDUCTION: This exam was  performed according to the departmental dose-optimization program which includes automated exposure control, adjustment of the mA and/or kV according to patient size and/or use of iterative reconstruction technique. CONTRAST:  75mL OMNIPAQUE  IOHEXOL  350 MG/ML SOLN COMPARISON:  04/24/2024 FINDINGS: Lower chest: No acute abnormality. Hepatobiliary: Gallbladder has been surgically removed. Liver is within normal limits. Pancreas: Unremarkable. No pancreatic ductal dilatation or surrounding inflammatory changes. Spleen: Normal in size without focal abnormality. Adrenals/Urinary Tract: Adrenal glands are within normal limits. Kidneys are well visualized bilaterally. No renal calculi or obstructive changes are seen. The bladder is decompressed. Stomach/Bowel: There are changes consistent with sigmoid diverticulitis somewhat progressed in the interval from the prior exam. Again, no abscess or perforation is noted. The more proximal colon shows fecal material throughout. The appendix is within normal limits. Small bowel and stomach are within normal limits. Vascular/Lymphatic: Aortic atherosclerosis. No enlarged abdominal or pelvic lymph nodes. Reproductive: Status post hysterectomy. No adnexal masses. Other: No abdominal wall hernia or abnormality. No abdominopelvic ascites. Musculoskeletal: No acute or significant osseous findings. IMPRESSION: Changes consistent with acute uncomplicated sigmoid diverticulitis. This has progressed slightly in the interval from the prior exam. No perforation is noted. Electronically Signed   By: Oneil Devonshire M.D.   On: 05/10/2024 20:23    EKG: Independently reviewed. none  Assessment/Plan Principal Problem:   Diverticulitis   Assessment and Plan:  Diverticulitis  Failed outpatient management - s/p ~6 days of Augmentin  +3 days cipro /flagyl   CT with acute uncomplicated sigmoid diverticulitis, slightly progressed from prior exam UA not c/w UTI Continue with ceftriaxone , flagyl    Clears for now Pain management with scheduled APAP, oxy prn She's noted good improvement in pain with oxycodone , hopefully with IV abx her symptoms improve and her need for opiate pain medicine is reduced.    Diarrhea If recurrent consider additional workup   Leukocytosis  Related to above  Hyponatremia Mild, follow with IVF  Anemia Trend   Allergic Reaction  Hives  Related to augmentin  vs bentyl  (occurred after bentyl  started, but she was also taking augmentin ) Resolved  Hypothyroidism Synthroid   Asthma  Singulair  Prn albuterol , symbicort    Essential Tremor Primidone    Dyslipidemia Statin     DVT prophylaxis: lovenox   Code Status:   full  Family Communication:  Husband at bedside  Disposition Plan:   Patient is from:  home  Anticipated DC to:  home  Anticipated DC date:  Pending further improvement  Anticipated DC barriers: Pending further improvement  Consults called:  none  Admission status:  inpatient   Severity of Illness: The appropriate patient status for this patient is INPATIENT. Inpatient status is judged to be reasonable and necessary in order to provide the required intensity of service to ensure the patient's safety. The patient's presenting symptoms, physical exam findings, and initial radiographic and laboratory data in the context of their chronic comorbidities is felt to place them at high risk for further clinical deterioration. Furthermore, it is not anticipated that the patient will be medically stable for discharge from the hospital within 2 midnights of admission.   * I certify that at the point of admission it is my clinical judgment that the patient will require inpatient hospital care spanning beyond 2 midnights from the point of admission due to high intensity of service, high risk for further deterioration and high frequency of surveillance required.DEWAINE Mau  Perri MD [facility]  How to contact the [facility] Attending or  Consulting provider 7A - 7P or covering provider during after hours 7P -7A, for this patient?   Check the care team in [facility] and look for Richards Pherigo) attending/consulting [facility] provider listed and b) the [facility] team listed Log into [email or web address] and use [facility] universal password to access. If you do not have the password, please contact the hospital operator. Locate the [facility] provider you are looking for under [facility] and page to Cleaven Demario number that you can be directly reached. If you still have difficulty reaching the provider, please page the [facility] (Director on Call) for the Hospitalists listed on amion for assistance.  05/11/2024, 9:33 AM

## 2024-05-11 NOTE — Progress Notes (Addendum)
 Transition of Care [facility]) - Inpatient Brief Assessment   Patient Details  Name: Melissa Lowe MRN: [id number] Date of Birth: 1941/05/01  Transition of Care [facility]) CM/SW Contact:    Melissa JONELLE Joe, RN Phone Number: 05/11/2024, 2:36 PM   Clinical Narrative: Patient admitted to the hospital for abdominal pain and diverticulitis.  Patient is normally independent and plans to return home when medically stable.  Patient has no DMe at home.  She has a walker at the home that is available but she does not use it.  [place] letter completed and Code 44 completed at the bedside.  Patient plans to return home with family when stable.  05/11/24 1506 - typo in the [facility] letter was corrected and updated copy of the [facility] letter was provided to the patient at the bedside.   Transition of Care Asessment: Insurance and Status: (P) Insurance coverage has been reviewed Patient has primary care physician: (P) Yes Home environment has been reviewed: (P) from home with spouse Prior level of function:: (P) Independent Prior/Current Home Services: (P) No current home services Social Drivers of Health Review: (P) SDOH reviewed interventions complete Readmission risk has been reviewed: (P) Yes Transition of care needs: (P) no transition of care needs at this time

## 2024-05-11 NOTE — Plan of Care (Signed)

## 2024-05-11 NOTE — Care Management Obs Status (Cosign Needed)
 MEDICARE OBSERVATION STATUS NOTIFICATION   Patient Details  Name: Melissa Lowe MRN: [id number] Date of Birth: [birth year]   Medicare Observation Status Notification Given:  Yes    [name], RN 05/11/2024, 3:05 PM

## 2024-05-11 NOTE — Care Management Obs Status (Cosign Needed)
 MEDICARE OBSERVATION STATUS NOTIFICATION   Patient Details  Name: Melissa Lowe MRN: [id number] Date of Birth: 03/17/1941   Medicare Observation Status Notification Given:  Yes    [name], RN 05/11/2024, 2:29 PM

## 2024-05-11 NOTE — Care Management CC44 (Cosign Needed)
 Condition Code 44 Documentation Completed  Patient Details  Name: LAKEN ROG MRN: [id number] Date of Birth: December 12, 1940   Condition Code 44 given:  Yes Patient signature on Condition Code 44 notice:  Yes Documentation of 2 MD's agreement:  Yes Code 44 added to claim:  Yes    [name], RN 05/11/2024, 3:05 PM

## 2024-05-11 NOTE — Care Management CC44 (Cosign Needed)
 Condition Code 44 Documentation Completed  Patient Details  Name: Melissa Lowe MRN: [id number] Date of Birth: 12-10-1940   Condition Code 44 given:    Patient signature on Condition Code 44 notice:    Documentation of 2 MD's agreement:    Code 44 added to claim:       [name], RN 05/11/2024, 2:33 PM

## 2024-05-12 ENCOUNTER — Other Ambulatory Visit (HOSPITAL_COMMUNITY): Payer: Self-pay

## 2024-05-12 DIAGNOSIS — K5732 Diverticulitis of large intestine without perforation or abscess without bleeding: Principal | ICD-10-CM

## 2024-05-12 LAB — COMPREHENSIVE METABOLIC PANEL WITH GFR
ALT: 12 U/L (ref 0–44)
AST: 10 U/L — ABNORMAL LOW (ref 15–41)
Albumin: 3 g/dL — ABNORMAL LOW (ref 3.5–5.0)
Alkaline Phosphatase: 44 U/L (ref 38–126)
Anion gap: 8 (ref 5–15)
BUN: 8 mg/dL (ref 8–23)
CO2: 23 mmol/L (ref 22–32)
Calcium: 8.8 mg/dL — ABNORMAL LOW (ref 8.9–10.3)
Chloride: 105 mmol/L (ref 98–111)
Creatinine, Ser: 0.78 mg/dL (ref 0.44–1.00)
GFR, Estimated: >60 mL/min (ref >60)
Glucose, Bld: 95 mg/dL (ref 70–99)
Potassium: 3.8 mmol/L (ref 3.5–5.1)
Sodium: 136 mmol/L (ref 135–145)
Total Bilirubin: 0.6 mg/dL (ref 0.0–1.2)
Total Protein: 5.5 g/dL — ABNORMAL LOW (ref 6.5–8.1)

## 2024-05-12 LAB — CBC
HCT: 33.4 % — ABNORMAL LOW (ref 36.0–46.0)
Hemoglobin: 11.2 g/dL — ABNORMAL LOW (ref 12.0–15.0)
MCH: 29.5 pg (ref 26.0–34.0)
MCHC: 33.5 g/dL (ref 30.0–36.0)
MCV: 87.9 fL (ref 80.0–100.0)
Platelets: 278 K/uL (ref 150–400)
RBC: 3.8 MIL/uL — ABNORMAL LOW (ref 3.87–5.11)
RDW: 13.7 % (ref 11.5–15.5)
WBC: 6.7 K/uL (ref 4.0–10.5)
nRBC: 0 % (ref 0.0–0.2)

## 2024-05-12 MED ORDER — DOCUSATE SODIUM 100 MG PO CAPS
200.0000 mg | ORAL_CAPSULE | Freq: Every day | ORAL | Status: DC
  Administered 2024-05-12 – 2024-05-13 (×2): 200 mg via ORAL
  Filled 2024-05-12 (×2): qty 2

## 2024-05-12 MED ORDER — LACTATED RINGERS IV SOLN: INTRAVENOUS | Status: DC

## 2024-05-12 MED ORDER — POLYETHYLENE GLYCOL 3350 17 G PO PACK
8.0000 g | PACK | Freq: Four times a day (QID) | ORAL | Status: DC
  Administered 2024-05-12: 8 g via ORAL
  Filled 2024-05-12: qty 1

## 2024-05-12 MED ORDER — METRONIDAZOLE 500 MG PO TABS
500.0000 mg | ORAL_TABLET | Freq: Two times a day (BID) | ORAL | 0 refills | Status: AC
  Filled 2024-05-12: qty 28, 14d supply, fill #0

## 2024-05-12 MED ORDER — OXYCODONE HCL 5 MG PO TABS: 2.5000 mg | ORAL_TABLET | ORAL | Status: DC | PRN

## 2024-05-12 MED ORDER — CIPROFLOXACIN HCL 500 MG PO TABS
500.0000 mg | ORAL_TABLET | Freq: Two times a day (BID) | ORAL | 0 refills | Status: DC
  Filled 2024-05-12: qty 28, 14d supply, fill #0

## 2024-05-12 MED ORDER — FLUCONAZOLE 150 MG PO TABS
150.0000 mg | ORAL_TABLET | Freq: Once | ORAL | 0 refills | Status: AC
  Filled 2024-05-12: qty 1, 1d supply, fill #0

## 2024-05-12 MED ORDER — POLYETHYLENE GLYCOL 3350 17 G PO PACK: 8.0000 g | PACK | Freq: Once | ORAL | Status: DC

## 2024-05-12 MED ORDER — METRONIDAZOLE 500 MG PO TABS
500.0000 mg | ORAL_TABLET | Freq: Two times a day (BID) | ORAL | Status: DC
  Administered 2024-05-12 – 2024-05-13 (×2): 500 mg via ORAL
  Filled 2024-05-12 (×2): qty 1

## 2024-05-12 NOTE — Progress Notes (Signed)
   Pt said she just had a massive BM. Still wants to stay the night. Will only take 1/2 dose of miralax  X 1 today.  Camellia Door, DO [facility]

## 2024-05-12 NOTE — Progress Notes (Signed)
 PROGRESS NOTE    [name]  FMW:[id number] DOB: [birth year] DOA: 05/10/2024 PCP: Geofm Glade PARAS, MD  Subjective: Patient seen examined.  Was able to talk with the patient's husband Riva via her iPhone.  Patient relates a history of abdominal pain starting around June 30.  She had a CT scan which showed diverticulitis.  She was started on p.o. Augmentin .  She completed 6.5 days of Augmentin .  She also started having some abdominal bloating at that time.  She was started on some Bentyl .  About a day and a half after starting Bentyl , she broke out in a rash.  She was seen again as an urgent clinic visit.  She was changed to p.o. Cipro  and Flagyl  since it was unclear whether or not she had an allergic reaction to the Bentyl  or Augmentin .  She completed a 4-day course of Cipro  and Flagyl .  She was okay for about 5 days until she start developing abdominal pain and bloating again.  She finally came to the ER.  CT abdomen pelvis again showed continued diverticulitis of the sigmoid colon.  Did not appear like it had gotten any better with antibiotic course.  She has been tolerating IV Rocephin .  No nausea or vomiting.  She wants to eat food.  She would like to go home.    Hospital Course: HPI: Melissa Lowe is a 83 y.o. female with medical history significant of asthma, essential tremor, dyslipidemia, hypothyroidism presenting with persistent abdominal pain with a diagnosis of diverticulitis.   She is a detailed historian.  She notes that her symptoms started around June 26.  She had sharp shooting pain in her mid lower abdomen abdomen.  She saw her outpatient PCP on June 30.  She had a CT scan that showed acute sigmoid diverticulitis.  She was started on Augmentin .  Notes that after 6-1/2 days, she was still having start, stabbing, and severe lower abdominal pain.  Her PCP was on vacation so she saw Dr. Berneta.  He added Bentyl  to her regimen and she was given return precautions.  She  subsequently developed hives after 1-1/2 days on these medicines.  She saw Dr. Joshua who stopped the Augmentin  and the Bentyl , gave a dose of solumedrol and prescribed Cipro  and Flagyl  for 3 days (as well as hydroxyzine ).  He notes things were okay on Saturday when she completed her antibiotic regimen.  But Wednesday morning she noted new low to moderate, steady pain.  She then developed a series of severe sharp stabbing pain.  She had 3 loose stools in about 30 minutes.  As a result she came to the hospital.  She denies nausea, vomiting, blood in her stool.  She did have chills.  No fevers.  Denies smoking, occasional drinking.  Significant Events: Admitted 05/10/2024 for acute diverticulitis that failed outpatient treatment with augmentin  and cipro /flagyl    Admission Labs: WBC 14.2, HgB 13.1, Plt 368 Na 133, K 4.2, CO2 of 24, BUN 10, Scr 0.85, glu 107  Admission Imaging Studies: CT abd/pelvis Changes consistent with acute uncomplicated sigmoid diverticulitis. This has progressed slightly in the interval from the prior exam. No perforation is noted  Significant Labs:   Significant Imaging Studies:   Antibiotic Therapy: Anti-infectives (From admission, onward)    Start     Dose/Rate Route Frequency Ordered Stop   05/11/24 1200  metroNIDAZOLE  (FLAGYL ) IVPB 500 mg        500 mg 100 mL/hr over 60 Minutes Intravenous Every 12 hours 05/11/24  0153     05/11/24 0900  cefTRIAXone  (ROCEPHIN ) 2 g in sodium chloride  0.9 % 100 mL IVPB       Note to Pharmacy: Per OP note review allergy was after starting on dicyclomine . Tolerating cefepime  in ED   2 g 200 mL/hr over 30 Minutes Intravenous Every 24 hours 05/11/24 0153     05/11/24 0100  ceFEPIme  (MAXIPIME ) 2 g in sodium chloride  0.9 % 100 mL IVPB       Placed in And Linked Group   2 g 200 mL/hr over 30 Minutes Intravenous  Once 05/11/24 0050 05/11/24 0155   05/11/24 0100  metroNIDAZOLE  (FLAGYL ) IVPB 500 mg       Placed in And Linked Group    500 mg 100 mL/hr over 60 Minutes Intravenous  Once 05/11/24 0050 05/11/24 0155       Procedures:   Consultants:     Assessment and Plan: * Sigmoid diverticulitis 05-12-2024 I had a long discussion with the patient and her husband via phone.  Gave her the option of either switching to p.o. Flagyl  and Cipro  for total of 14 days.  The other option would be to place a PICC line and have her go home on IV Rocephin  and p.o. Flagyl .  She would like to stay conservative and start taking p.o. antibiotics again but changing to Cipro  and Flagyl .  Will refer her to GI as an outpatient to make sure that she does not need a follow-up colonoscopy.  Change her to a soft GI diet.  Will see if she can eat breakfast and lunch.  If she can, she can go home.  Can stop IV fluids.  She states that she has p.o. oxycodone  and p.o. Zofran  at home.  DVT prophylaxis: enoxaparin  (LOVENOX ) injection 40 mg Start: 05/11/24 1730    Code Status: Full Code Family Communication: discussed with pt and with husband Chuck(via pt's own iphone) Disposition Plan: return home Reason for continuing need for hospitalization: maybe home today if she tolerated solid food.  Objective: Vitals:   05/11/24 1946 05/12/24 0006 05/12/24 0334 05/12/24 0745  BP: 137/61 123/64 (!) 122/58 132/69  Pulse: 68 72 65 63  Resp: 19 17 18    Temp: 99 F (37.2 C) 98.4 F (36.9 C) 98.1 F (36.7 C) 97.8 F (36.6 C)  TempSrc:   Oral Oral  SpO2: 100% 98% 99% 100%  Weight:      Height:        Intake/Output Summary (Last 24 hours) at 05/12/2024 1123 Last data filed at 05/12/2024 0535 Gross per 24 hour  Intake 4901.64 ml  Output --  Net 4901.64 ml   Filed Weights   05/10/24 1714  Weight: 79.4 kg    Examination:  Physical Exam Vitals and nursing note reviewed.  Constitutional:      General: She is not in acute distress.    Appearance: She is normal weight. She is not toxic-appearing or diaphoretic.  HENT:     Head: Normocephalic and  atraumatic.     Nose: Nose normal.  Eyes:     General: No scleral icterus. Cardiovascular:     Rate and Rhythm: Normal rate and regular rhythm.  Pulmonary:     Effort: Pulmonary effort is normal. No respiratory distress.     Breath sounds: Normal breath sounds. No wheezing.  Abdominal:     General: Bowel sounds are normal. There is no distension.     Palpations: Abdomen is soft.     Tenderness: There  is no abdominal tenderness.  Musculoskeletal:     Right lower leg: No edema.     Left lower leg: No edema.  Skin:    General: Skin is warm and dry.     Capillary Refill: Capillary refill takes less than 2 seconds.  Neurological:     General: No focal deficit present.     Mental Status: She is alert and oriented to person, place, and time.     Data Reviewed: I have personally reviewed following labs and imaging studies  CBC: Recent Labs  Lab 05/10/24 1805 05/11/24 0301 05/11/24 1658 05/12/24 0239  WBC 14.2* 10.8* 8.3 6.7  NEUTROABS 10.7*  --   --   --   HGB 13.1 11.9* 11.6* 11.2*  HCT 39.8 35.9* 35.8* 33.4*  MCV 89.0 90.0 89.7 87.9  PLT 368 307 296 278   Basic Metabolic Panel: Recent Labs  Lab 05/10/24 1805 05/11/24 0301 05/11/24 1658 05/12/24 0239  NA 133* 132*  --  136  K 4.2 4.5  --  3.8  CL 99 101  --  105  CO2 24 23  --  23  GLUCOSE 107* 99  --  95  BUN 10 10  --  8  CREATININE 0.85 0.82 0.78 0.78  CALCIUM 9.2 8.9  --  8.8*  MG  --  2.1  --   --   PHOS  --  3.5  --   --    GFR: Estimated Creatinine Clearance: 58.4 mL/min (by C-G formula based on SCr of 0.78 mg/dL). Liver Function Tests: Recent Labs  Lab 05/10/24 1805 05/12/24 0239  AST 15 10*  ALT 14 12  ALKPHOS 59 44  BILITOT 0.6 0.6  PROT 6.9 5.5*  ALBUMIN  3.9 3.0*    Radiology Studies: CT ABDOMEN PELVIS W CONTRAST Result Date: 05/10/2024 CLINICAL DATA:  Acute abdominal pain, history of recent surgery EXAM: CT ABDOMEN AND PELVIS WITH CONTRAST TECHNIQUE: Multidetector CT imaging of the  abdomen and pelvis was performed using the standard protocol following bolus administration of intravenous contrast. RADIATION DOSE REDUCTION: This exam was performed according to the departmental dose-optimization program which includes automated exposure control, adjustment of the mA and/or kV according to patient size and/or use of iterative reconstruction technique. CONTRAST:  75mL OMNIPAQUE  IOHEXOL  350 MG/ML SOLN COMPARISON:  04/24/2024 FINDINGS: Lower chest: No acute abnormality. Hepatobiliary: Gallbladder has been surgically removed. Liver is within normal limits. Pancreas: Unremarkable. No pancreatic ductal dilatation or surrounding inflammatory changes. Spleen: Normal in size without focal abnormality. Adrenals/Urinary Tract: Adrenal glands are within normal limits. Kidneys are well visualized bilaterally. No renal calculi or obstructive changes are seen. The bladder is decompressed. Stomach/Bowel: There are changes consistent with sigmoid diverticulitis somewhat progressed in the interval from the prior exam. Again, no abscess or perforation is noted. The more proximal colon shows fecal material throughout. The appendix is within normal limits. Small bowel and stomach are within normal limits. Vascular/Lymphatic: Aortic atherosclerosis. No enlarged abdominal or pelvic lymph nodes. Reproductive: Status post hysterectomy. No adnexal masses. Other: No abdominal wall hernia or abnormality. No abdominopelvic ascites. Musculoskeletal: No acute or significant osseous findings. IMPRESSION: Changes consistent with acute uncomplicated sigmoid diverticulitis. This has progressed slightly in the interval from the prior exam. No perforation is noted. Electronically Signed   By: Oneil Devonshire M.D.   On: 05/10/2024 20:23    Scheduled Meds:  ascorbic acid   500 mg Oral Daily   cholecalciferol   1,000 Units Oral Daily   enoxaparin  (LOVENOX ) injection  40 mg Subcutaneous Q24H   famotidine   40 mg Oral Daily    fluticasone   1 spray Each Nare Daily   levothyroxine   50 mcg Oral Q0600   montelukast   10 mg Oral Daily   pravastatin   40 mg Oral QHS   primidone   25 mg Oral QHS   Continuous Infusions:  cefTRIAXone  (ROCEPHIN )  IV 2 g (05/12/24 0923)   metronidazole  500 mg (05/11/24 2309)     LOS: 1 day   Time spent: 55 minutes  Camellia Door, DO  [facility]  05/12/2024, 11:23 AM

## 2024-05-12 NOTE — Plan of Care (Signed)

## 2024-05-12 NOTE — Hospital Course (Signed)
 HPI: Melissa Lowe is a 83 y.o. female with medical history significant of asthma, essential tremor, dyslipidemia, hypothyroidism presenting with persistent abdominal pain with a diagnosis of diverticulitis.   She is a detailed historian.  She notes that her symptoms started around June 26.  She had sharp shooting pain in her mid lower abdomen abdomen.  She saw her outpatient PCP on June 30.  She had a CT scan that showed acute sigmoid diverticulitis.  She was started on Augmentin .  Notes that after 6-1/2 days, she was still having start, stabbing, and severe lower abdominal pain.  Her PCP was on vacation so she saw Dr. Berneta.  He added Bentyl  to her regimen and she was given return precautions.  She subsequently developed hives after 1-1/2 days on these medicines.  She saw Dr. Joshua who stopped the Augmentin  and the Bentyl , gave a dose of solumedrol and prescribed Cipro  and Flagyl  for 3 days (as well as hydroxyzine ).  He notes things were okay on Saturday when she completed her antibiotic regimen.  But Wednesday morning she noted new low to moderate, steady pain.  She then developed a series of severe sharp stabbing pain.  She had 3 loose stools in about 30 minutes.  As a result she came to the hospital.  She denies nausea, vomiting, blood in her stool.  She did have chills.  No fevers.  Denies smoking, occasional drinking.  Significant Events: Admitted 05/10/2024 for acute diverticulitis that failed outpatient treatment with augmentin  and cipro /flagyl    Admission Labs: WBC 14.2, HgB 13.1, Plt 368 Na 133, K 4.2, CO2 of 24, BUN 10, Scr 0.85, glu 107  Admission Imaging Studies: CT abd/pelvis Changes consistent with acute uncomplicated sigmoid diverticulitis. This has progressed slightly in the interval from the prior exam. No perforation is noted  Significant Labs:   Significant Imaging Studies:   Antibiotic Therapy: Anti-infectives (From admission, onward)    Start     Dose/Rate Route  Frequency Ordered Stop   05/11/24 1200  metroNIDAZOLE  (FLAGYL ) IVPB 500 mg        500 mg 100 mL/hr over 60 Minutes Intravenous Every 12 hours 05/11/24 0153     05/11/24 0900  cefTRIAXone  (ROCEPHIN ) 2 g in sodium chloride  0.9 % 100 mL IVPB       Note to Pharmacy: Per OP note review allergy was after starting on dicyclomine . Tolerating cefepime  in ED   2 g 200 mL/hr over 30 Minutes Intravenous Every 24 hours 05/11/24 0153     05/11/24 0100  ceFEPIme  (MAXIPIME ) 2 g in sodium chloride  0.9 % 100 mL IVPB       Placed in And Linked Group   2 g 200 mL/hr over 30 Minutes Intravenous  Once 05/11/24 0050 05/11/24 0155   05/11/24 0100  metroNIDAZOLE  (FLAGYL ) IVPB 500 mg       Placed in And Linked Group   500 mg 100 mL/hr over 60 Minutes Intravenous  Once 05/11/24 0050 05/11/24 0155       Procedures:   Consultants:

## 2024-05-12 NOTE — Assessment & Plan Note (Addendum)
 05-12-2024 I had a long discussion with the patient and her husband via phone.  Gave her the option of either switching to p.o. Flagyl  and Cipro  for total of 14 days.  The other option would be to place a PICC line and have her go home on IV Rocephin  and p.o. Flagyl .  She would like to stay conservative and start taking p.o. antibiotics again but changing to Cipro  and Flagyl .  Will refer her to GI as an outpatient to make sure that she does not need a follow-up colonoscopy.  Change her to a soft GI diet.  Will see if she can eat breakfast and lunch.  If she can, she can go home.  Can stop IV fluids.  She states that she has p.o. oxycodone  and p.o. Zofran  at home.  05-13-2024 stable for DC. DC to home with 14 days of cipro /flagyl . Colace bid to keep stools soft/mushy. Low fiber diet for next 10-14 days. Probiotic tid to prevent c. Diff. F/u with PCP. If she is still having abd after 2 weeks of Cipro /flagyl , she may need repeat CT abd/pelvis. GI referral to see if she needs another colonoscopy. Pt states she has zofran  and oxycodone  at home already and does not need new Rx. Discussed with pt and husband at bedside.

## 2024-05-12 NOTE — Progress Notes (Signed)
   Afternoon rounds. Met with pt and husband at bedside. Pt nervous about going home. Has not had a BM yet in 2 days. Had some abd pain with breakfast. None with lunch. She wants to have a BM while still in the hospital to make sure her abd pain does not return. Have ordered 1/2 dose of miralax  q6h x 2 doses. Pt states she had massive diarrhea with full dose miralax (17 grams) before.  Camellia Door, DO [facility]

## 2024-05-12 NOTE — Subjective & Objective (Addendum)
 Patient seen examined.  Had large BM prior to miralax  dose last night. Another smaller BM after miralax . Tolerating soft gi diet. No fevers. Ready to go home. Asked lots of questions.

## 2024-05-13 ENCOUNTER — Other Ambulatory Visit (HOSPITAL_COMMUNITY): Payer: Self-pay

## 2024-05-13 DIAGNOSIS — K5732 Diverticulitis of large intestine without perforation or abscess without bleeding: Secondary | ICD-10-CM | POA: Diagnosis not present

## 2024-05-13 LAB — CBC WITH DIFFERENTIAL/PLATELET
Abs Immature Granulocytes: 0.07 K/uL (ref 0.00–0.07)
Basophils Absolute: 0.1 K/uL (ref 0.0–0.1)
Basophils Relative: 1 %
Eosinophils Absolute: 0.3 K/uL (ref 0.0–0.5)
Eosinophils Relative: 4 %
HCT: 34.6 % — ABNORMAL LOW (ref 36.0–46.0)
Hemoglobin: 11.6 g/dL — ABNORMAL LOW (ref 12.0–15.0)
Immature Granulocytes: 1 %
Lymphocytes Relative: 28 %
Lymphs Abs: 2 K/uL (ref 0.7–4.0)
MCH: 29.7 pg (ref 26.0–34.0)
MCHC: 33.5 g/dL (ref 30.0–36.0)
MCV: 88.5 fL (ref 80.0–100.0)
Monocytes Absolute: 0.7 K/uL (ref 0.1–1.0)
Monocytes Relative: 10 %
Neutro Abs: 4.1 K/uL (ref 1.7–7.7)
Neutrophils Relative %: 56 %
Platelets: 300 K/uL (ref 150–400)
RBC: 3.91 MIL/uL (ref 3.87–5.11)
RDW: 13.6 % (ref 11.5–15.5)
WBC: 7.1 K/uL (ref 4.0–10.5)
nRBC: 0 % (ref 0.0–0.2)

## 2024-05-13 LAB — COMPREHENSIVE METABOLIC PANEL WITH GFR
ALT: 9 U/L (ref 0–44)
AST: 11 U/L — ABNORMAL LOW (ref 15–41)
Albumin: 3 g/dL — ABNORMAL LOW (ref 3.5–5.0)
Alkaline Phosphatase: 41 U/L (ref 38–126)
Anion gap: 8 (ref 5–15)
BUN: 8 mg/dL (ref 8–23)
CO2: 24 mmol/L (ref 22–32)
Calcium: 9.1 mg/dL (ref 8.9–10.3)
Chloride: 103 mmol/L (ref 98–111)
Creatinine, Ser: 0.73 mg/dL (ref 0.44–1.00)
GFR, Estimated: >60 mL/min (ref >60)
Glucose, Bld: 95 mg/dL (ref 70–99)
Potassium: 4.1 mmol/L (ref 3.5–5.1)
Sodium: 135 mmol/L (ref 135–145)
Total Bilirubin: 0.7 mg/dL (ref 0.0–1.2)
Total Protein: 5.6 g/dL — ABNORMAL LOW (ref 6.5–8.1)

## 2024-05-13 LAB — SEDIMENTATION RATE: Sed Rate: 11 mm/h (ref 0–22)

## 2024-05-13 MED ORDER — CIPROFLOXACIN HCL 500 MG PO TABS
500.0000 mg | ORAL_TABLET | Freq: Two times a day (BID) | ORAL | 0 refills | Status: DC
  Filled 2024-05-13: qty 28, 14d supply, fill #0

## 2024-05-13 MED ORDER — ACIDOPHILUS PO CAPS
1.0000 | ORAL_CAPSULE | Freq: Three times a day (TID) | ORAL | 0 refills | Status: AC
  Filled 2024-05-13: qty 90, 30d supply, fill #0

## 2024-05-13 MED ORDER — DOCUSATE SODIUM 100 MG PO CAPS
100.0000 mg | ORAL_CAPSULE | Freq: Two times a day (BID) | ORAL | 0 refills | Status: AC | PRN
  Filled 2024-05-13: qty 60, 30d supply, fill #0

## 2024-05-13 NOTE — Progress Notes (Signed)
 PROGRESS NOTE    Melissa Lowe  FMW:[id number] DOB: Apr 07, 1941 DOA: 05/10/2024 PCP: Geofm Glade PARAS, MD  Subjective: Patient seen examined.  Had large BM prior to miralax  dose last night. Another smaller BM after miralax . Tolerating soft gi diet. No fevers. Ready to go home. Asked lots of questions.   Hospital Course: HPI: Melissa Lowe is a 83 y.o. female with medical history significant of asthma, essential tremor, dyslipidemia, hypothyroidism presenting with persistent abdominal pain with a diagnosis of diverticulitis.   She is a detailed historian.  She notes that her symptoms started around June 26.  She had sharp shooting pain in her mid lower abdomen abdomen.  She saw her outpatient PCP on June 30.  She had a CT scan that showed acute sigmoid diverticulitis.  She was started on Augmentin .  Notes that after 6-1/2 days, she was still having start, stabbing, and severe lower abdominal pain.  Her PCP was on vacation so she saw Dr. Berneta.  He added Bentyl  to her regimen and she was given return precautions.  She subsequently developed hives after 1-1/2 days on these medicines.  She saw Dr. Joshua who stopped the Augmentin  and the Bentyl , gave a dose of solumedrol and prescribed Cipro  and Flagyl  for 3 days (as well as hydroxyzine ).  He notes things were okay on Saturday when she completed her antibiotic regimen.  But Wednesday morning she noted new low to moderate, steady pain.  She then developed a series of severe sharp stabbing pain.  She had 3 loose stools in about 30 minutes.  As a result she came to the hospital.  She denies nausea, vomiting, blood in her stool.  She did have chills.  No fevers.  Denies smoking, occasional drinking.  Significant Events: Admitted 05/10/2024 for acute diverticulitis that failed outpatient treatment with augmentin  and cipro /flagyl    Admission Labs: WBC 14.2, HgB 13.1, Plt 368 Na 133, K 4.2, CO2 of 24, BUN 10, Scr 0.85, glu 107  Admission Imaging  Studies: CT abd/pelvis Changes consistent with acute uncomplicated sigmoid diverticulitis. This has progressed slightly in the interval from the prior exam. No perforation is noted  Significant Labs:   Significant Imaging Studies:   Antibiotic Therapy: Anti-infectives (From admission, onward)    Start     Dose/Rate Route Frequency Ordered Stop   05/11/24 1200  metroNIDAZOLE  (FLAGYL ) IVPB 500 mg        500 mg 100 mL/hr over 60 Minutes Intravenous Every 12 hours 05/11/24 0153     05/11/24 0900  cefTRIAXone  (ROCEPHIN ) 2 g in sodium chloride  0.9 % 100 mL IVPB       Note to Pharmacy: Per OP note review allergy was after starting on dicyclomine . Tolerating cefepime  in ED   2 g 200 mL/hr over 30 Minutes Intravenous Every 24 hours 05/11/24 0153     05/11/24 0100  ceFEPIme  (MAXIPIME ) 2 g in sodium chloride  0.9 % 100 mL IVPB       Placed in And Linked Group   2 g 200 mL/hr over 30 Minutes Intravenous  Once 05/11/24 0050 05/11/24 0155   05/11/24 0100  metroNIDAZOLE  (FLAGYL ) IVPB 500 mg       Placed in And Linked Group   500 mg 100 mL/hr over 60 Minutes Intravenous  Once 05/11/24 0050 05/11/24 0155       Procedures:   Consultants:     Assessment and Plan: * Sigmoid diverticulitis 05-12-2024 I had a long discussion with the patient and her husband via  phone.  Gave her the option of either switching to p.o. Flagyl  and Cipro  for total of 14 days.  The other option would be to place a PICC line and have her go home on IV Rocephin  and p.o. Flagyl .  She would like to stay conservative and start taking p.o. antibiotics again but changing to Cipro  and Flagyl .  Will refer her to GI as an outpatient to make sure that she does not need a follow-up colonoscopy.  Change her to a soft GI diet.  Will see if she can eat breakfast and lunch.  If she can, she can go home.  Can stop IV fluids.  She states that she has p.o. oxycodone  and p.o. Zofran  at home.  05-13-2024 stable for DC. DC to home with  14 days of cipro /flagyl . Colace bid to keep stools soft/mushy. Low fiber diet for next 10-14 days. Probiotic tid to prevent c. Diff. F/u with PCP. If she is still having abd after 2 weeks of Cipro /flagyl , she may need repeat CT abd/pelvis. GI referral to see if she needs another colonoscopy. Pt states she has zofran  and oxycodone  at home already and does not need new Rx. Discussed with pt and husband at bedside.   DVT prophylaxis:  SCDs. Pt is ambulatory    Code Status: Full Code Family Communication: discussed at bedside with pt and husband Disposition Plan: return home Reason for continuing need for hospitalization: stable for DC today.  Objective: Vitals:   05/12/24 2129 05/12/24 2201 05/13/24 0500 05/13/24 0745  BP:  (!) 126/56 128/65 120/67  Pulse:  66 70 61  Resp: 20 20 18 15   Temp:  98.3 F (36.8 C) 97.6 F (36.4 C) 98 F (36.7 C)  TempSrc:      SpO2: 96% 97% 99% 100%  Weight:      Height:        Intake/Output Summary (Last 24 hours) at 05/13/2024 0905 Last data filed at 05/13/2024 0844 Gross per 24 hour  Intake 433.34 ml  Output --  Net 433.34 ml   Filed Weights   05/10/24 1714  Weight: 79.4 kg    Examination:  Physical Exam Vitals and nursing note reviewed.  Constitutional:      General: She is not in acute distress.    Appearance: She is not toxic-appearing or diaphoretic.  HENT:     Head: Normocephalic and atraumatic.     Nose: Nose normal.  Eyes:     General: No scleral icterus. Cardiovascular:     Rate and Rhythm: Normal rate and regular rhythm.  Pulmonary:     Effort: Pulmonary effort is normal.     Breath sounds: Normal breath sounds.  Abdominal:     General: Abdomen is flat. Bowel sounds are normal. There is no distension.     Palpations: Abdomen is soft.     Tenderness: There is abdominal tenderness in the left lower quadrant.     Comments: Mild LLQ tenderness. No rebound or guarding. Abd is soft. Not rigid.  Musculoskeletal:     Right  lower leg: No edema.     Left lower leg: No edema.  Skin:    General: Skin is warm and dry.     Capillary Refill: Capillary refill takes less than 2 seconds.  Neurological:     General: No focal deficit present.     Mental Status: She is alert and oriented to person, place, and time.     Data Reviewed: I have personally reviewed following labs  and imaging studies  CBC: Recent Labs  Lab 05/10/24 1805 05/11/24 0301 05/11/24 1658 05/12/24 0239 05/13/24 0244  WBC 14.2* 10.8* 8.3 6.7 7.1  NEUTROABS 10.7*  --   --   --  4.1  HGB 13.1 11.9* 11.6* 11.2* 11.6*  HCT 39.8 35.9* 35.8* 33.4* 34.6*  MCV 89.0 90.0 89.7 87.9 88.5  PLT 368 307 296 278 300   Basic Metabolic Panel: Recent Labs  Lab 05/10/24 1805 05/11/24 0301 05/11/24 1658 05/12/24 0239 05/13/24 0244  NA 133* 132*  --  136 135  K 4.2 4.5  --  3.8 4.1  CL 99 101  --  105 103  CO2 24 23  --  23 24  GLUCOSE 107* 99  --  95 95  BUN 10 10  --  8 8  CREATININE 0.85 0.82 0.78 0.78 0.73  CALCIUM 9.2 8.9  --  8.8* 9.1  MG  --  2.1  --   --   --   PHOS  --  3.5  --   --   --    GFR: Estimated Creatinine Clearance: 58.4 mL/min (by C-G formula based on SCr of 0.73 mg/dL). Liver Function Tests: Recent Labs  Lab 05/10/24 1805 05/12/24 0239 05/13/24 0244  AST 15 10* 11*  ALT 14 12 9   ALKPHOS 59 44 41  BILITOT 0.6 0.6 0.7  PROT 6.9 5.5* 5.6*  ALBUMIN  3.9 3.0* 3.0*   Scheduled Meds:  ascorbic acid   500 mg Oral Daily   cholecalciferol   1,000 Units Oral Daily   docusate sodium   200 mg Oral Daily   famotidine   40 mg Oral Daily   fluticasone   1 spray Each Nare Daily   levothyroxine   50 mcg Oral Q0600   metroNIDAZOLE   500 mg Oral Q12H   montelukast   10 mg Oral Daily   polyethylene glycol  8 g Oral Once   pravastatin   40 mg Oral QHS   primidone   25 mg Oral QHS   Continuous Infusions:  cefTRIAXone  (ROCEPHIN )  IV 2 g (05/13/24 0856)     LOS: 1 day   Time spent: 55 minutes  Camellia Door, DO  [facility]  05/13/2024, 9:05 AM

## 2024-05-13 NOTE — Plan of Care (Signed)
   Problem: Education: Goal: Knowledge of General Education information will improve Description: Including pain rating scale, medication(s)/side effects and non-pharmacologic comfort measures Outcome: Progressing   Problem: Health Behavior/Discharge Planning: Goal: Ability to manage health-related needs will improve Outcome: Progressing   Problem: Activity: Goal: Risk for activity intolerance will decrease Outcome: Progressing

## 2024-05-13 NOTE — Plan of Care (Signed)

## 2024-05-13 NOTE — Discharge Summary (Signed)
 [facility] Physician Discharge Summary   Patient name: Melissa Lowe  Admit date:     05/10/2024  Discharge date: 05/13/2024  Attending Physician: PERRI MICKEY DELENA MELITON [[id number]]  Discharge Physician: Camellia Door   PCP: Geofm Glade PARAS, MD  Admitted From: Home  Disposition:  Home  Recommendations for Outpatient Follow-up:  Follow up with PCP in 1-2 weeks GI referral for recurrent diverticulitis  Home Health:No Equipment/Devices: None    Discharge Condition:Stable CODE STATUS:FULL Diet recommendation: Low fiber/GI soft diet Fluid Restriction: None  Hospital Summary: HPI: Melissa Lowe is a 83 y.o. female with medical history significant of asthma, essential tremor, dyslipidemia, hypothyroidism presenting with persistent abdominal pain with a diagnosis of diverticulitis.   She is a detailed historian.  She notes that her symptoms started around June 26.  She had sharp shooting pain in her mid lower abdomen abdomen.  She saw her outpatient PCP on June 30.  She had a CT scan that showed acute sigmoid diverticulitis.  She was started on Augmentin .  Notes that after 6-1/2 days, she was still having start, stabbing, and severe lower abdominal pain.  Her PCP was on vacation so she saw Dr. Berneta.  He added Bentyl  to her regimen and she was given return precautions.  She subsequently developed hives after 1-1/2 days on these medicines.  She saw Dr. Joshua who stopped the Augmentin  and the Bentyl , gave a dose of solumedrol and prescribed Cipro  and Flagyl  for 3 days (as well as hydroxyzine ).  He notes things were okay on Saturday when she completed her antibiotic regimen.  But Wednesday morning she noted new low to moderate, steady pain.  She then developed a series of severe sharp stabbing pain.  She had 3 loose stools in about 30 minutes.  As a result she came to the hospital.  She denies nausea, vomiting, blood in her stool.  She did have chills.  No fevers.  Denies smoking,  occasional drinking.  Significant Events: Admitted 05/10/2024 for acute diverticulitis that failed outpatient treatment with augmentin  and cipro /flagyl    Admission Labs: WBC 14.2, HgB 13.1, Plt 368 Na 133, K 4.2, CO2 of 24, BUN 10, Scr 0.85, glu 107  Admission Imaging Studies: CT abd/pelvis Changes consistent with acute uncomplicated sigmoid diverticulitis. This has progressed slightly in the interval from the prior exam. No perforation is noted  Significant Labs:   Significant Imaging Studies:   Antibiotic Therapy: Anti-infectives (From admission, onward)    Start     Dose/Rate Route Frequency Ordered Stop   05/11/24 1200  metroNIDAZOLE  (FLAGYL ) IVPB 500 mg        500 mg 100 mL/hr over 60 Minutes Intravenous Every 12 hours 05/11/24 0153     05/11/24 0900  cefTRIAXone  (ROCEPHIN ) 2 g in sodium chloride  0.9 % 100 mL IVPB       Note to Pharmacy: Per OP note review allergy was after starting on dicyclomine . Tolerating cefepime  in ED   2 g 200 mL/hr over 30 Minutes Intravenous Every 24 hours 05/11/24 0153     05/11/24 0100  ceFEPIme  (MAXIPIME ) 2 g in sodium chloride  0.9 % 100 mL IVPB       Placed in And Linked Group   2 g 200 mL/hr over 30 Minutes Intravenous  Once 05/11/24 0050 05/11/24 0155   05/11/24 0100  metroNIDAZOLE  (FLAGYL ) IVPB 500 mg       Placed in And Linked Group   500 mg 100 mL/hr over 60 Minutes Intravenous  Once  05/11/24 0050 05/11/24 0155       Procedures:   Consultants:    Hospital Course by Problem: * Sigmoid diverticulitis 05-12-2024 I had a long discussion with the patient and her husband via phone.  Gave her the option of either switching to p.o. Flagyl  and Cipro  for total of 14 days.  The other option would be to place a PICC line and have her go home on IV Rocephin  and p.o. Flagyl .  She would like to stay conservative and start taking p.o. antibiotics again but changing to Cipro  and Flagyl .  Will refer her to GI as an outpatient to make sure  that she does not need a follow-up colonoscopy.  Change her to a soft GI diet.  Will see if she can eat breakfast and lunch.  If she can, she can go home.  Can stop IV fluids.  She states that she has p.o. oxycodone  and p.o. Zofran  at home.  05-13-2024 stable for DC. DC to home with 14 days of cipro /flagyl . Colace bid to keep stools soft/mushy. Low fiber diet for next 10-14 days. Probiotic tid to prevent c. Diff. F/u with PCP. If she is still having abd after 2 weeks of Cipro /flagyl , she may need repeat CT abd/pelvis. GI referral to see if she needs another colonoscopy. Pt states she has zofran  and oxycodone  at home already and does not need new Rx. Discussed with pt and husband at bedside.    Discharge Diagnoses:  Principal Problem:   Sigmoid diverticulitis   Discharge Instructions  Discharge Instructions     Ambulatory referral to Gastroenterology   Complete by: As directed    Recurrent diverticulitis.   What is the reason for referral?: Other   Call MD for:  difficulty breathing, headache or visual disturbances   Complete by: As directed    Call MD for:  extreme fatigue   Complete by: As directed    Call MD for:  hives   Complete by: As directed    Call MD for:  persistant dizziness or light-headedness   Complete by: As directed    Call MD for:  persistant nausea and vomiting   Complete by: As directed    Call MD for:  redness, tenderness, or signs of infection (pain, swelling, redness, odor or green/yellow discharge around incision site)   Complete by: As directed    Call MD for:  severe uncontrolled pain   Complete by: As directed    Call MD for:  temperature >100.4   Complete by: As directed    DIET SOFT   Complete by: As directed    Low fiber diet. Less than 10 grams per day for the next 10-14 days   Discharge instructions   Complete by: As directed    1. Follow up with your primary care provider in 1-2 weeks following discharge from hospital.   Increase activity  slowly   Complete by: As directed       Allergies as of 05/13/2024       Reactions   Augmentin  [amoxicillin -pot Clavulanate] Hives   Sulfonamide Derivatives    Rash on inside of arms Because of a history of documented adverse serious drug reaction;Medi Alert bracelet  is recommended   Tetracycline    Severe itching on lower leg   Dicyclomine  Hives        Medication List     STOP taking these medications    bimatoprost 0.03 % ophthalmic solution Commonly known as: LATISSE   tacrolimus  0.03 %  ointment Commonly known as: Protopic        TAKE these medications    acidophilus Caps capsule Take 1 capsule by mouth in the morning, at noon, and at bedtime.   albuterol  108 (90 Base) MCG/ACT inhaler Commonly known as: VENTOLIN  HFA USE 1 INHALATION BY MOUTH EVERY  6 HOURS AS NEEDED FOR WHEEZING  OR SHORTNESS OF BREATH   ascorbic acid  500 MG tablet Commonly known as: VITAMIN C  Take 500 mg by mouth daily.   budesonide -formoterol  80-4.5 MCG/ACT inhaler Commonly known as: Symbicort  Inhale 2 puffs into the lungs 2 (two) times daily. What changed:  how much to take when to take this   ciprofloxacin  500 MG tablet Commonly known as: Cipro  Take 1 tablet (500 mg total) by mouth 2 (two) times daily for 14 days. Start taking on: May 14, 2024   docusate sodium  100 MG capsule Commonly known as: COLACE Take 1 capsule (100 mg total) by mouth 2 (two) times daily as needed for mild constipation.   ELDERBERRY PO Take 2 tablets by mouth daily.   estradiol  0.1 MG/GM vaginal cream Commonly known as: ESTRACE  Place 0.5g twice a week   famotidine  20 MG tablet Commonly known as: PEPCID  Take 40 mg by mouth daily.   fluconazole  150 MG tablet Commonly known as: Diflucan  Take 1 tablet (150 mg total) by mouth once for 1 dose.   fluticasone  50 MCG/ACT nasal spray Commonly known as: FLONASE  Place 1 spray into both nostrils daily. 1-2 sprays in the left nostril daily   hydrOXYzine   10 MG tablet Commonly known as: ATARAX  Take 1 tablet (10 mg total) by mouth every 8 (eight) hours as needed.   hyoscyamine  0.125 MG tablet Commonly known as: LEVSIN  Take 1 tablet (0.125 mg total) by mouth every 4 (four) hours as needed.   levothyroxine  50 MCG tablet Commonly known as: SYNTHROID  TAKE 1 TABLET BY MOUTH IN  THE MORNING   metroNIDAZOLE  500 MG tablet Commonly known as: FLAGYL  Take 1 tablet (500 mg total) by mouth 2 (two) times daily for 14 days.   montelukast  10 MG tablet Commonly known as: SINGULAIR  Take 1 tablet (10 mg total) by mouth daily.   oxyCODONE  5 MG immediate release tablet Commonly known as: Oxy IR/ROXICODONE  Take 1 tablet (5 mg total) by mouth every 4 (four) hours as needed for severe pain (pain score 7-10).   pravastatin  40 MG tablet Commonly known as: PRAVACHOL  Take 1 tablet (40 mg total) by mouth at bedtime.   primidone  50 MG tablet Commonly known as: MYSOLINE  Take 1/2 tablet at night   Vitamin D  (Cholecalciferol ) 25 MCG (1000 UT) Caps Take 1,000 Units by mouth daily.        Allergies  Allergen Reactions   Augmentin  [Amoxicillin -Pot Clavulanate] Hives   Sulfonamide Derivatives     Rash on inside of arms Because of a history of documented adverse serious drug reaction;Medi Alert bracelet  is recommended   Tetracycline     Severe itching on lower leg   Dicyclomine  Hives    Discharge Exam: Vitals:   05/13/24 0500 05/13/24 0745  BP: 128/65 120/67  Pulse: 70 61  Resp: 18 15  Temp: 97.6 F (36.4 C) 98 F (36.7 C)  SpO2: 99% 100%    Physical Exam Vitals and nursing note reviewed.  Constitutional:      General: She is not in acute distress.    Appearance: She is not toxic-appearing or diaphoretic.  HENT:     Head: Normocephalic and atraumatic.  Nose: Nose normal.  Eyes:     General: No scleral icterus. Cardiovascular:     Rate and Rhythm: Normal rate and regular rhythm.  Pulmonary:     Effort: Pulmonary effort is  normal.     Breath sounds: Normal breath sounds.  Abdominal:     General: Abdomen is flat. Bowel sounds are normal. There is no distension.     Palpations: Abdomen is soft.     Tenderness: There is abdominal tenderness in the left lower quadrant.     Comments: Mild LLQ tenderness. No rebound or guarding. Abd is soft. Not rigid.  Musculoskeletal:     Right lower leg: No edema.     Left lower leg: No edema.  Skin:    General: Skin is warm and dry.     Capillary Refill: Capillary refill takes less than 2 seconds.  Neurological:     General: No focal deficit present.     Mental Status: She is alert and oriented to person, place, and time.     The results of significant diagnostics from this hospitalization (including imaging, microbiology, ancillary and laboratory) are listed below for reference.     Labs: Basic Metabolic Panel: Recent Labs  Lab 05/10/24 1805 05/11/24 0301 05/11/24 1658 05/12/24 0239 05/13/24 0244  NA 133* 132*  --  136 135  K 4.2 4.5  --  3.8 4.1  CL 99 101  --  105 103  CO2 24 23  --  23 24  GLUCOSE 107* 99  --  95 95  BUN 10 10  --  8 8  CREATININE 0.85 0.82 0.78 0.78 0.73  CALCIUM 9.2 8.9  --  8.8* 9.1  MG  --  2.1  --   --   --   PHOS  --  3.5  --   --   --    Liver Function Tests: Recent Labs  Lab 05/10/24 1805 05/12/24 0239 05/13/24 0244  AST 15 10* 11*  ALT 14 12 9   ALKPHOS 59 44 41  BILITOT 0.6 0.6 0.7  PROT 6.9 5.5* 5.6*  ALBUMIN  3.9 3.0* 3.0*   CBC: Recent Labs  Lab 05/10/24 1805 05/11/24 0301 05/11/24 1658 05/12/24 0239 05/13/24 0244  WBC 14.2* 10.8* 8.3 6.7 7.1  NEUTROABS 10.7*  --   --   --  4.1  HGB 13.1 11.9* 11.6* 11.2* 11.6*  HCT 39.8 35.9* 35.8* 33.4* 34.6*  MCV 89.0 90.0 89.7 87.9 88.5  PLT 368 307 296 278 300   Urinalysis    Component Value Date/Time   COLORURINE YELLOW 05/10/2024 1805   APPEARANCEUR CLEAR 05/10/2024 1805   LABSPEC 1.015 05/10/2024 1805   PHURINE 5.0 05/10/2024 1805   GLUCOSEU NEGATIVE  05/10/2024 1805   GLUCOSEU NEGATIVE 05/01/2024 1429   HGBUR SMALL (A) 05/10/2024 1805   HGBUR large 01/01/2011 1141   BILIRUBINUR NEGATIVE 05/10/2024 1805   BILIRUBINUR Negative 12/28/2023 1033   KETONESUR NEGATIVE 05/10/2024 1805   PROTEINUR NEGATIVE 05/10/2024 1805   UROBILINOGEN 0.2 05/01/2024 1429   NITRITE NEGATIVE 05/10/2024 1805   LEUKOCYTESUR NEGATIVE 05/10/2024 1805   Sepsis Labs Recent Labs  Lab 05/11/24 0301 05/11/24 1658 05/12/24 0239 05/13/24 0244  WBC 10.8* 8.3 6.7 7.1   Inflammatory Markers: Lab Results  Component Value Date/Time   ESRSEDRATE 11 05/13/2024 02:44 AM   Procedures/Studies: CT ABDOMEN PELVIS W CONTRAST Result Date: 05/10/2024 CLINICAL DATA:  Acute abdominal pain, history of recent surgery EXAM: CT ABDOMEN AND PELVIS WITH CONTRAST TECHNIQUE: Multidetector  CT imaging of the abdomen and pelvis was performed using the standard protocol following bolus administration of intravenous contrast. RADIATION DOSE REDUCTION: This exam was performed according to the departmental dose-optimization program which includes automated exposure control, adjustment of the mA and/or kV according to patient size and/or use of iterative reconstruction technique. CONTRAST:  75mL OMNIPAQUE  IOHEXOL  350 MG/ML SOLN COMPARISON:  04/24/2024 FINDINGS: Lower chest: No acute abnormality. Hepatobiliary: Gallbladder has been surgically removed. Liver is within normal limits. Pancreas: Unremarkable. No pancreatic ductal dilatation or surrounding inflammatory changes. Spleen: Normal in size without focal abnormality. Adrenals/Urinary Tract: Adrenal glands are within normal limits. Kidneys are well visualized bilaterally. No renal calculi or obstructive changes are seen. The bladder is decompressed. Stomach/Bowel: There are changes consistent with sigmoid diverticulitis somewhat progressed in the interval from the prior exam. Again, no abscess or perforation is noted. The more proximal colon shows  fecal material throughout. The appendix is within normal limits. Small bowel and stomach are within normal limits. Vascular/Lymphatic: Aortic atherosclerosis. No enlarged abdominal or pelvic lymph nodes. Reproductive: Status post hysterectomy. No adnexal masses. Other: No abdominal wall hernia or abnormality. No abdominopelvic ascites. Musculoskeletal: No acute or significant osseous findings. IMPRESSION: Changes consistent with acute uncomplicated sigmoid diverticulitis. This has progressed slightly in the interval from the prior exam. No perforation is noted. Electronically Signed   By: Oneil Devonshire M.D.   On: 05/10/2024 20:23   CT ABDOMEN PELVIS W CONTRAST Result Date: 04/24/2024 CLINICAL DATA:  Left lower quadrant abdominal pain. EXAM: CT ABDOMEN AND PELVIS WITH CONTRAST TECHNIQUE: Multidetector CT imaging of the abdomen and pelvis was performed using the standard protocol following bolus administration of intravenous contrast. RADIATION DOSE REDUCTION: This exam was performed according to the departmental dose-optimization program which includes automated exposure control, adjustment of the mA and/or kV according to patient size and/or use of iterative reconstruction technique. CONTRAST:  100mL ISOVUE -300 IOPAMIDOL  (ISOVUE -300) INJECTION 61% COMPARISON:  None Available. FINDINGS: Lower chest: The visualized lung bases are clear. No intra-abdominal free air or free fluid. Hepatobiliary: The liver is unremarkable. No biliary dilatation. Cholecystectomy. No retained calcified stone noted in the central CBD Pancreas: Unremarkable. No pancreatic ductal dilatation or surrounding inflammatory changes. Spleen: Normal in size without focal abnormality. Adrenals/Urinary Tract: The adrenal glands are unremarkable. The kidneys, visualized ureters, and urinary bladder appear unremarkable. Stomach/Bowel: There is sigmoid diverticulosis. There is focal area of inflammatory changes and thickening of the sigmoid colon  consistent with acute diverticulitis. No diverticular abscess or perforation. There is no bowel obstruction. The appendix is normal. Vascular/Lymphatic: Mild aortoiliac atherosclerotic disease. The IVC is unremarkable. No portal venous gas. There is no adenopathy. Reproductive: Hysterectomy.  No suspicious adnexal masses. Other: None Musculoskeletal: Degenerative changes of the spine. No acute osseous pathology. IMPRESSION: 1. Acute sigmoid diverticulitis. No diverticular abscess or perforation. 2.  Aortic Atherosclerosis (ICD10-I70.0). Electronically Signed   By: Vanetta Chou M.D.   On: 04/24/2024 16:17   ECHOCARDIOGRAM COMPLETE Result Date: 04/18/2024    ECHOCARDIOGRAM REPORT   Patient Name:   KURA BETHARDS Date of Exam: 04/18/2024 Medical Rec #:  [id number]         Height:       65.5 in Accession #:    [id number]        Weight:       176.5 lb Date of Birth:  September 01, 1941         BSA:          1.886 m Patient Age:  82 years          BP:           115/70 mmHg Patient Gender: F                 HR:           59 bpm. Exam Location:  Outpatient Procedure: 2D Echo, 3D Echo, Strain Analysis, Cardiac Doppler and Color Doppler            (Both Spectral and Color Flow Doppler were utilized during            procedure). Indications:    Diastolic dysfunction  History:        Patient has prior history of Echocardiogram examinations, most                 recent 04/16/2023. Arrythmias:PAC, Signs/Symptoms:Chest Pain and                 Dizziness/Lightheadedness; Risk Factors:Non-Smoker and                 Dyslipidemia.  Sonographer:    Orvil Holmes RDCS Referring Phys: [id number] TIFFANY Le[place] IMPRESSIONS  1. Left ventricular ejection fraction, by estimation, is 55 to 60%. Left ventricular ejection fraction by 3D volume is 61 %. The left ventricle has normal function. The left ventricle has no regional wall motion abnormalities. Left ventricular diastolic  parameters are consistent with Grade I diastolic dysfunction  (impaired relaxation).  2. Right ventricular systolic function is normal. The right ventricular size is normal. Tricuspid regurgitation signal is inadequate for assessing PA pressure.  3. The mitral valve is grossly normal. Mild mitral valve regurgitation. No evidence of mitral stenosis.  4. The aortic valve is tricuspid. There is mild thickening of the aortic valve. Aortic valve regurgitation is trivial. No aortic stenosis is present.  5. The inferior vena cava is normal in size with greater than 50% respiratory variability, suggesting right atrial pressure of 3 mmHg. FINDINGS  Left Ventricle: Left ventricular ejection fraction, by estimation, is 55 to 60%. Left ventricular ejection fraction by 3D volume is 61 %. The left ventricle has normal function. The left ventricle has no regional wall motion abnormalities. Global longitudinal strain performed but not reported based on interpreter judgement due to suboptimal tracking. The left ventricular internal cavity size was normal in size. There is no left ventricular hypertrophy. Left ventricular diastolic parameters are consistent with Grade I diastolic dysfunction (impaired relaxation). Right Ventricle: The right ventricular size is normal. No increase in right ventricular wall thickness. Right ventricular systolic function is normal. Tricuspid regurgitation signal is inadequate for assessing PA pressure. Left Atrium: Left atrial size was normal in size. Right Atrium: Right atrial size was normal in size. Pericardium: There is no evidence of pericardial effusion. Mitral Valve: The mitral valve is grossly normal. Mild mitral valve regurgitation. No evidence of mitral valve stenosis. Tricuspid Valve: The tricuspid valve is normal in structure. Tricuspid valve regurgitation is trivial. No evidence of tricuspid stenosis. Aortic Valve: The aortic valve is tricuspid. There is mild thickening of the aortic valve. Aortic valve regurgitation is trivial. Aortic regurgitation  PHT measures 579 msec. No aortic stenosis is present. Pulmonic Valve: The pulmonic valve was normal in structure. Pulmonic valve regurgitation is mild. No evidence of pulmonic stenosis. Aorta: The aortic root is normal in size and structure. Venous: The inferior vena cava is normal in size with greater than 50% respiratory variability, suggesting right atrial pressure of 3 mmHg. IAS/Shunts:  No atrial level shunt detected by color flow Doppler. Additional Comments: 3D was performed not requiring image post processing on an independent workstation and was normal.  LEFT VENTRICLE PLAX 2D LVIDd:         4.21 cm         Diastology LVIDs:         2.41 cm         LV e' medial:    7.83 cm/s LV PW:         0.88 cm         LV E/e' medial:  6.4 LV IVS:        0.69 cm         LV e' lateral:   8.59 cm/s LVOT diam:     1.80 cm         LV E/e' lateral: 5.8 LV SV:         64 LV SV Index:   34 LVOT Area:     2.54 cm        3D Volume EF                                LV 3D EF:    Left                                             ventricul                                             ar                                             ejection                                             fraction                                             by 3D                                             volume is                                             61 %.                                 3D Volume EF:  3D EF:        61 %                                LV EDV:       119 ml                                LV ESV:       46 ml                                LV SV:        73 ml RIGHT VENTRICLE RV Basal diam:  3.18 cm RV Mid diam:    2.70 cm RV S prime:     13.50 cm/s TAPSE (M-mode): 3.4 cm LEFT ATRIUM             Index        RIGHT ATRIUM           Index LA diam:        3.20 cm 1.70 cm/m   RA Area:     12.30 cm LA Vol (A2C):   51.4 ml 27.25 ml/m  RA Volume:   27.90 ml  14.79 ml/m LA Vol (A4C):   34.2 ml 18.13 ml/m LA  Biplane Vol: 43.8 ml 23.22 ml/m  AORTIC VALVE LVOT Vmax:   114.00 cm/s LVOT Vmean:  70.700 cm/s LVOT VTI:    0.250 m AI PHT:      579 msec  AORTA Ao Root diam: 3.80 cm Ao Asc diam:  3.60 cm MITRAL VALVE MV Area (PHT): 3.37 cm       SHUNTS MV Decel Time: 225 msec       Systemic VTI:  0.25 m MR Peak grad:    81.0 mmHg    Systemic Diam: 1.80 cm MR Vmax:         450.00 cm/s MR PISA:         3.08 cm MR PISA Eff ROA: 21 mm MR PISA Radius:  0.70 cm MV E velocity: 49.80 cm/s MV A velocity: 73.40 cm/s MV E/A ratio:  0.68 Soyla Merck MD Electronically signed by Soyla Merck MD Signature Date/Time: 04/18/2024/12:38:47 PM    Final     Time coordinating discharge: 55 mins  SIGNED:  Camellia Door, DO [facility] 05/13/24, 9:07 AM

## 2024-05-15 ENCOUNTER — Telehealth: Payer: Self-pay

## 2024-05-15 NOTE — Transitions of Care (Post Inpatient/ED Visit) (Signed)
 05/15/2024  Name: Melissa Lowe MRN: [id number] DOB: 1941/10/02  Today's TOC FU Call Status: Today's TOC FU Call Status:: Successful TOC FU Call Completed TOC FU Call Complete Date: 05/15/24 Patient's Name and Date of Birth confirmed.  Transition Care Management Follow-up Telephone Call Date of Discharge: 05/13/24 Discharge Facility: [facility]) Type of Discharge: Inpatient Admission Primary Inpatient Discharge Diagnosis:: diverticulitis How have you been since you were released from the hospital?: Better Any questions or concerns?: No  Items Reviewed: Did you receive and understand the discharge instructions provided?: Yes Medications obtained,verified, and reconciled?: Yes (Medications Reviewed) Any new allergies since your discharge?: No Dietary orders reviewed?: Yes Do you have support at home?: Yes People in Home [RPT]: spouse  Medications Reviewed Today: Medications Reviewed Today     Reviewed by Emmitt Pan, LPN (Licensed Practical Nurse) on 05/15/24 at 1440  Med List Status: <None>   Medication Order Taking? Sig Documenting Provider Last Dose Status Informant  albuterol  (VENTOLIN  HFA) 108 (90 Base) MCG/ACT inhaler [id number]  USE 1 INHALATION BY MOUTH EVERY  6 HOURS AS NEEDED FOR WHEEZING  OR SHORTNESS OF BREATH  Patient not taking: Reported on 05/15/2024   Geofm Glade PARAS, MD  Active Self, Pharmacy Records  ascorbic acid  (VITAMIN C ) 500 MG tablet [id number] Yes Take 500 mg by mouth daily. [provider]  Active Self, Pharmacy Records  budesonide -formoterol  (SYMBICORT ) 80-4.5 MCG/ACT inhaler [id number] Yes Inhale 2 puffs into the lungs 2 (two) times daily.  Patient taking differently: Inhale 1 puff into the lungs daily.   Geofm Glade PARAS, MD  Active Self, Pharmacy Records  ciprofloxacin  (CIPRO ) 500 MG tablet [id number] Yes Take 1 tablet (500 mg total) by mouth 2 (two) times daily for 14 days. Laurence Locus, DO  Active   docusate sodium  (COLACE) 100 MG  capsule [id number] Yes Take 1 capsule (100 mg total) by mouth 2 (two) times daily as needed for mild constipation. Laurence Locus, DO  Active   ELDERBERRY PO [id number] Yes Take 2 tablets by mouth daily. [provider]  Active Self, Pharmacy Records  estradiol  (ESTRACE ) 0.1 MG/GM vaginal cream [id number]  Place 0.5g twice a week  Patient not taking: Reported on 05/15/2024   [name], MD  Active Self, Pharmacy Records  famotidine  (PEPCID ) 20 MG tablet [id number] Yes Take 40 mg by mouth daily.  [provider]  Active Self, Pharmacy Records  fluticasone  (FLONASE ) 50 MCG/ACT nasal spray [id number] Yes Place 1 spray into both nostrils daily. 1-2 sprays in the left nostril daily [provider]  Active Self, Pharmacy Records  hydrOXYzine  (ATARAX ) 10 MG tablet [id number] Yes Take 1 tablet (10 mg total) by mouth every 8 (eight) hours as needed. Joshua Debby LITTIE, MD  Active Self, Pharmacy Records  hyoscyamine  (LEVSIN ) 0.125 MG tablet [id number] Yes Take 1 tablet (0.125 mg total) by mouth every 4 (four) hours as needed. Joshua Debby LITTIE, MD  Active Self, Pharmacy Records  Lactobacillus (ACIDOPHILUS) CAPS capsule [id number] Yes Take 1 capsule by mouth in the morning, at noon, and at bedtime. Laurence Locus, DO  Active   levothyroxine  (SYNTHROID ) 50 MCG tablet [id number] Yes TAKE 1 TABLET BY MOUTH IN  THE MORNING Burns, Glade PARAS, MD  Active Self, Pharmacy Records  metroNIDAZOLE  (FLAGYL ) 500 MG tablet [id number] Yes Take 1 tablet (500 mg total) by mouth 2 (two) times daily for 14 days. Laurence Locus, DO  Active   montelukast  (SINGULAIR ) 10 MG tablet [id number] Yes Take 1 tablet (10 mg  total) by mouth daily. Geofm Glade PARAS, MD  Active Self, Pharmacy Records  oxyCODONE  (OXY IR/ROXICODONE ) 5 MG immediate release tablet [id number] Yes Take 1 tablet (5 mg total) by mouth every 4 (four) hours as needed for severe pain (pain score 7-10). Joshua Debby CROME, MD  Active Self, Pharmacy Records  pravastatin   (PRAVACHOL ) 40 MG tablet [id number] Yes Take 1 tablet (40 mg total) by mouth at bedtime. Geofm Glade PARAS, MD  Active Self, Pharmacy Records  primidone  (MYSOLINE ) 50 MG tablet [id number] Yes Take 1/2 tablet at night Tat, Asberry RAMAN, DO  Active Self, Pharmacy Records  Vitamin D , Cholecalciferol , 25 MCG (1000 UT) CAPS [id number] Yes Take 1,000 Units by mouth daily. [provider]  Active Self, Pharmacy Records            Home Care and Equipment/Supplies: Were Home Health Services Ordered?: NA Any new equipment or medical supplies ordered?: NA  Functional Questionnaire: Do you need assistance with bathing/showering or dressing?: No Do you need assistance with meal preparation?: No Do you need assistance with eating?: No Do you have difficulty maintaining continence: No Do you need assistance with getting out of bed/getting out of a chair/moving?: No Do you have difficulty managing or taking your medications?: No  Follow up appointments reviewed: PCP Follow-up appointment confirmed?: Yes Date of PCP follow-up appointment?: 05/18/24 Follow-up Provider: [facility] Follow-up appointment confirmed?: No Reason Specialist Follow-Up Not Confirmed: Patient has Specialist Provider Number and will Call for Appointment Do you need transportation to your follow-up appointment?: No Do you understand care options if your condition(s) worsen?: Yes-patient verbalized understanding    SIGNATURE Julian Lemmings, LPN [facility] Nurse Health Advisor Direct Dial [phone]

## 2024-05-17 NOTE — Progress Notes (Unsigned)
 Subjective:    Patient ID: Melissa Lowe, female    DOB: 12/06/1940, 83 y.o.   MRN: [id number]     HPI [name] is here for follow up of her diverticulitis.  6/30 -  I saw her for left lower quadrant and suprapubic pain.  CT scan confirmed diverticulitis.  Started on Augmentin  875-125 mg twice daily x 10 days.  Blood work was unremarkable.  7/7 she was seen by Dr. Berneta because she was still having pain.  She was experiencing mid lower abdominal spasms.  She was started on dicyclomine  10 mg twice daily as needed.  WBC was elevated.  7/9-seen by Dr. Joshua for rash.  Likely related to dicyclomine .  She had severe itching and hives.  She was prescribed hydroxyzine  10 mg every 8 hours as needed for itch.  She received a dose of Solu-Medrol .  She was prescribed Cipro  500 mg twice daily x 3 days and Flagyl  250 mg twice daily x 3 days and was advised to stop the Augmentin ..  In addition she was prescribed promethazine  for nausea and oxycodone  for pain.  She completed the antibiotics and she did feel better, but soon after that started to have pain again.  He was having loose stools again.  7/16-admitted to ED-CT scan of the abdomen and pelvis showed uncomplicated sigmoid diverticulitis-slightly progressed from prior exam.  UA was negative for UTI.  She was started on ceftriaxone , Flagyl .  She was on Tylenol  for the pain and oxycodone  as needed.  She was discharged home on Cipro , Flagyl  x 2 weeks  Medications and allergies reviewed with patient and updated if appropriate.  Current Outpatient Medications on File Prior to Visit  Medication Sig Dispense Refill   albuterol  (VENTOLIN  HFA) 108 (90 Base) MCG/ACT inhaler USE 1 INHALATION BY MOUTH EVERY  6 HOURS AS NEEDED FOR WHEEZING  OR SHORTNESS OF BREATH (Patient not taking: Reported on 05/15/2024) 36 g 3   ascorbic acid  (VITAMIN C ) 500 MG tablet Take 500 mg by mouth daily.     budesonide -formoterol  (SYMBICORT ) 80-4.5 MCG/ACT inhaler Inhale 2  puffs into the lungs 2 (two) times daily. (Patient taking differently: Inhale 1 puff into the lungs daily.) 30.6 g 11   ciprofloxacin  (CIPRO ) 500 MG tablet Take 1 tablet (500 mg total) by mouth 2 (two) times daily for 14 days. 28 tablet 0   docusate sodium  (COLACE) 100 MG capsule Take 1 capsule (100 mg total) by mouth 2 (two) times daily as needed for mild constipation. 60 capsule 0   ELDERBERRY PO Take 2 tablets by mouth daily.     estradiol  (ESTRACE ) 0.1 MG/GM vaginal cream Place 0.5g twice a week (Patient not taking: Reported on 05/15/2024) 42.5 g 11   famotidine  (PEPCID ) 20 MG tablet Take 40 mg by mouth daily.      fluticasone  (FLONASE ) 50 MCG/ACT nasal spray Place 1 spray into both nostrils daily. 1-2 sprays in the left nostril daily     hydrOXYzine  (ATARAX ) 10 MG tablet Take 1 tablet (10 mg total) by mouth every 8 (eight) hours as needed. 30 tablet 0   hyoscyamine  (LEVSIN ) 0.125 MG tablet Take 1 tablet (0.125 mg total) by mouth every 4 (four) hours as needed. 30 tablet 0   Lactobacillus (ACIDOPHILUS) CAPS capsule Take 1 capsule by mouth in the morning, at noon, and at bedtime. 90 capsule 0   levothyroxine  (SYNTHROID ) 50 MCG tablet TAKE 1 TABLET BY MOUTH IN  THE MORNING 90 tablet 3  metroNIDAZOLE  (FLAGYL ) 500 MG tablet Take 1 tablet (500 mg total) by mouth 2 (two) times daily for 14 days. 28 tablet 0   montelukast  (SINGULAIR ) 10 MG tablet Take 1 tablet (10 mg total) by mouth daily. 90 tablet 3   oxyCODONE  (OXY IR/ROXICODONE ) 5 MG immediate release tablet Take 1 tablet (5 mg total) by mouth every 4 (four) hours as needed for severe pain (pain score 7-10). 10 tablet 0   pravastatin  (PRAVACHOL ) 40 MG tablet Take 1 tablet (40 mg total) by mouth at bedtime. 90 tablet 3   primidone  (MYSOLINE ) 50 MG tablet Take 1/2 tablet at night 90 tablet 1   Vitamin D , Cholecalciferol , 25 MCG (1000 UT) CAPS Take 1,000 Units by mouth daily.     No current facility-administered medications on file prior to visit.      Review of Systems     Objective:  There were no vitals filed for this visit. BP Readings from Last 3 Encounters:  05/13/24 120/67  05/09/24 114/73  05/03/24 136/72   Wt Readings from Last 3 Encounters:  05/10/24 175 lb (79.4 kg)  05/03/24 179 lb 6.4 oz (81.4 kg)  05/01/24 180 lb 6.4 oz (81.8 kg)   There is no height or weight on file to calculate BMI.    Physical Exam     Lab Results  Component Value Date   WBC 7.1 05/13/2024   HGB 11.6 (L) 05/13/2024   HCT 34.6 (L) 05/13/2024   PLT 300 05/13/2024   GLUCOSE 95 05/13/2024   CHOL 156 02/22/2024   TRIG 131.0 02/22/2024   HDL 64.30 02/22/2024   LDLDIRECT 115.0 07/05/2019   LDLCALC 66 02/22/2024   ALT 9 05/13/2024   AST 11 (L) 05/13/2024   NA 135 05/13/2024   K 4.1 05/13/2024   CL 103 05/13/2024   CREATININE 0.73 05/13/2024   BUN 8 05/13/2024   CO2 24 05/13/2024   TSH 2.09 02/22/2024   HGBA1C 5.8 02/22/2024   CT ABDOMEN PELVIS W CONTRAST CLINICAL DATA:  Acute abdominal pain, history of recent surgery  EXAM: CT ABDOMEN AND PELVIS WITH CONTRAST  TECHNIQUE: Multidetector CT imaging of the abdomen and pelvis was performed using the standard protocol following bolus administration of intravenous contrast.  RADIATION DOSE REDUCTION: This exam was performed according to the departmental dose-optimization program which includes automated exposure control, adjustment of the mA and/or kV according to patient size and/or use of iterative reconstruction technique.  CONTRAST:  75mL OMNIPAQUE  IOHEXOL  350 MG/ML SOLN  COMPARISON:  04/24/2024  FINDINGS: Lower chest: No acute abnormality.  Hepatobiliary: Gallbladder has been surgically removed. Liver is within normal limits.  Pancreas: Unremarkable. No pancreatic ductal dilatation or surrounding inflammatory changes.  Spleen: Normal in size without focal abnormality.  Adrenals/Urinary Tract: Adrenal glands are within normal limits. Kidneys are well  visualized bilaterally. No renal calculi or obstructive changes are seen. The bladder is decompressed.  Stomach/Bowel: There are changes consistent with sigmoid diverticulitis somewhat progressed in the interval from the prior exam. Again, no abscess or perforation is noted. The more proximal colon shows fecal material throughout. The appendix is within normal limits. Small bowel and stomach are within normal limits.  Vascular/Lymphatic: Aortic atherosclerosis. No enlarged abdominal or pelvic lymph nodes.  Reproductive: Status post hysterectomy. No adnexal masses.  Other: No abdominal wall hernia or abnormality. No abdominopelvic ascites.  Musculoskeletal: No acute or significant osseous findings.  IMPRESSION: Changes consistent with acute uncomplicated sigmoid diverticulitis. This has progressed slightly in the interval from the  prior exam. No perforation is noted.  Electronically Signed   By: Oneil Devonshire M.D.   On: 05/10/2024 20:23    Assessment & Plan:    See Problem List for Assessment and Plan of chronic medical problems.

## 2024-05-17 NOTE — Patient Instructions (Incomplete)
      Blood work was ordered.       Medications changes include :   None    A referral was ordered and someone will call you to schedule an appointment.     No follow-ups on file.

## 2024-05-17 NOTE — Assessment & Plan Note (Signed)
 Subacute Improving Augmentin  not effective after several days Cipro , Flagyl  it helped, but course may not have been long enough-did end up in the emergency room all the antibiotics Discharged home on Cipro , Flagyl  x 14 days

## 2024-05-18 ENCOUNTER — Ambulatory Visit: Admitting: Internal Medicine

## 2024-05-18 ENCOUNTER — Encounter: Payer: Self-pay | Admitting: Internal Medicine

## 2024-05-18 VITALS — BP 110/72 | HR 65 | Temp 98.3°F | Ht 65.0 in | Wt 176.0 lb

## 2024-05-18 DIAGNOSIS — K5732 Diverticulitis of large intestine without perforation or abscess without bleeding: Secondary | ICD-10-CM

## 2024-05-18 DIAGNOSIS — M7662 Achilles tendinitis, left leg: Secondary | ICD-10-CM

## 2024-05-18 MED ORDER — CEFPODOXIME PROXETIL 200 MG PO TABS: 200.0000 mg | ORAL_TABLET | Freq: Two times a day (BID) | ORAL | 0 refills | Status: AC

## 2024-05-18 NOTE — Assessment & Plan Note (Signed)
 Acute Started about 3 days ago Denies any significant activity Has pain and swelling lower Achilles Possibly related to Cipro  which we will stop and replace with a different antibiotic She will see Dr. Arnaldo -sports medicine today at 130

## 2024-05-22 ENCOUNTER — Ambulatory Visit: Admitting: Internal Medicine

## 2024-05-30 ENCOUNTER — Other Ambulatory Visit: Payer: Self-pay | Admitting: Internal Medicine

## 2024-05-30 DIAGNOSIS — E038 Other specified hypothyroidism: Secondary | ICD-10-CM

## 2024-06-15 ENCOUNTER — Encounter: Payer: Self-pay | Admitting: Internal Medicine

## 2024-06-15 NOTE — Progress Notes (Unsigned)
    Subjective:    Patient ID: Melissa Lowe, female    DOB: October 09, 1941, 83 y.o.   MRN: [id number]      HPI Melissa Lowe is here for No chief complaint on file.        Medications and allergies reviewed with patient and updated if appropriate.  Current Outpatient Medications on File Prior to Visit  Medication Sig Dispense Refill   albuterol  (VENTOLIN  HFA) 108 (90 Base) MCG/ACT inhaler USE 1 INHALATION BY MOUTH EVERY  6 HOURS AS NEEDED FOR WHEEZING  OR SHORTNESS OF BREATH 36 g 3   ascorbic acid  (VITAMIN C ) 500 MG tablet Take 500 mg by mouth daily.     budesonide -formoterol  (SYMBICORT ) 80-4.5 MCG/ACT inhaler Inhale 2 puffs into the lungs 2 (two) times daily. (Patient taking differently: Inhale 1 puff into the lungs daily.) 30.6 g 11   ELDERBERRY PO Take 2 tablets by mouth daily.     estradiol  (ESTRACE ) 0.1 MG/GM vaginal cream Place 0.5g twice a week 42.5 g 11   famotidine  (PEPCID ) 20 MG tablet Take 40 mg by mouth daily.      fluticasone  (FLONASE ) 50 MCG/ACT nasal spray Place 1 spray into both nostrils daily. 1-2 sprays in the left nostril daily     hydrOXYzine  (ATARAX ) 10 MG tablet Take 1 tablet (10 mg total) by mouth every 8 (eight) hours as needed. 30 tablet 0   hyoscyamine  (LEVSIN ) 0.125 MG tablet Take 1 tablet (0.125 mg total) by mouth every 4 (four) hours as needed. 30 tablet 0   levothyroxine  (SYNTHROID ) 50 MCG tablet TAKE 1 TABLET BY MOUTH IN THE  MORNING 90 tablet 3   montelukast  (SINGULAIR ) 10 MG tablet Take 1 tablet (10 mg total) by mouth daily. 90 tablet 3   oxyCODONE  (OXY IR/ROXICODONE ) 5 MG immediate release tablet Take 1 tablet (5 mg total) by mouth every 4 (four) hours as needed for severe pain (pain score 7-10). 10 tablet 0   pravastatin  (PRAVACHOL ) 40 MG tablet Take 1 tablet (40 mg total) by mouth at bedtime. 90 tablet 3   primidone  (MYSOLINE ) 50 MG tablet Take 1/2 tablet at night 90 tablet 1   Vitamin D , Cholecalciferol , 25 MCG (1000 UT) CAPS Take 1,000 Units by mouth  daily.     No current facility-administered medications on file prior to visit.    Review of Systems     Objective:  There were no vitals filed for this visit. BP Readings from Last 3 Encounters:  05/18/24 110/72  05/13/24 120/67  05/09/24 114/73   Wt Readings from Last 3 Encounters:  05/18/24 176 lb (79.8 kg)  05/10/24 175 lb (79.4 kg)  05/03/24 179 lb 6.4 oz (81.4 kg)   There is no height or weight on file to calculate BMI.    Physical Exam         Assessment & Plan:    See Problem List for Assessment and Plan of chronic medical problems.

## 2024-06-15 NOTE — Telephone Encounter (Signed)
 Patient was offered to see another provider at [facility] on the afternoon of Thursday, 8/21 but declined. She only wants to see Dr. Geofm. Appointment scheduled for Friday morning at 8:50.  Copied from CRM #[id number]. Topic: Clinical - Red Word Triage >> Jun 15, 2024  8:10 AM Melissa Lowe wrote: Red Word that prompted transfer to Nurse Triage: Pt husband Melissa Lowe called in to schedule an appointment with pt PCP for severe diarrhea. Melissa Lowe stated that pt has had diverticulitis a couple of times and she just completed a treatment about 2 weeks ago from it coming back. Once I let husband know that I will be unable to schedule pt due to pt experiencing severe diarrhea and needing to be transferred to nurse triage, pt came to the phone and denied speaking to a nurse.

## 2024-06-16 ENCOUNTER — Other Ambulatory Visit: Payer: Self-pay

## 2024-06-16 ENCOUNTER — Emergency Department
Admission: EM | Admit: 2024-06-16 | Discharge: 2024-06-16 | Disposition: A | Discharge status: Home / Self Care | Attending: Emergency Medicine | Admitting: Emergency Medicine

## 2024-06-16 ENCOUNTER — Encounter: Payer: Self-pay | Admitting: Internal Medicine

## 2024-06-16 ENCOUNTER — Emergency Department

## 2024-06-16 ENCOUNTER — Ambulatory Visit: Admitting: Internal Medicine

## 2024-06-16 VITALS — BP 110/80 | HR 77 | Temp 99.1°F | Ht 65.0 in | Wt 174.0 lb

## 2024-06-16 DIAGNOSIS — N390 Urinary tract infection, site not specified: Secondary | ICD-10-CM | POA: Diagnosis not present

## 2024-06-16 DIAGNOSIS — R21 Rash and other nonspecific skin eruption: Secondary | ICD-10-CM | POA: Insufficient documentation

## 2024-06-16 DIAGNOSIS — J45909 Unspecified asthma, uncomplicated: Secondary | ICD-10-CM | POA: Insufficient documentation

## 2024-06-16 DIAGNOSIS — K449 Diaphragmatic hernia without obstruction or gangrene: Secondary | ICD-10-CM | POA: Diagnosis not present

## 2024-06-16 DIAGNOSIS — R509 Fever, unspecified: Secondary | ICD-10-CM | POA: Insufficient documentation

## 2024-06-16 DIAGNOSIS — R109 Unspecified abdominal pain: Secondary | ICD-10-CM | POA: Diagnosis not present

## 2024-06-16 DIAGNOSIS — R251 Tremor, unspecified: Secondary | ICD-10-CM | POA: Insufficient documentation

## 2024-06-16 DIAGNOSIS — K5732 Diverticulitis of large intestine without perforation or abscess without bleeding: Secondary | ICD-10-CM

## 2024-06-16 DIAGNOSIS — R1032 Left lower quadrant pain: Secondary | ICD-10-CM

## 2024-06-16 LAB — URINALYSIS, ROUTINE W REFLEX MICROSCOPIC
Bilirubin Urine: NEGATIVE
Glucose, UA: NEGATIVE mg/dL
Hgb urine dipstick: NEGATIVE
Ketones, ur: 5 mg/dL — AB
Nitrite: POSITIVE — AB
Protein, ur: NEGATIVE mg/dL
Specific Gravity, Urine: 1.013 (ref 1.005–1.030)
WBC, UA: >50 WBC/hpf (ref 0–5)
pH: 5 (ref 5.0–8.0)

## 2024-06-16 LAB — CBC
HCT: 41.2 % (ref 36.0–46.0)
Hemoglobin: 13.4 g/dL (ref 12.0–15.0)
MCH: 29.3 pg (ref 26.0–34.0)
MCHC: 32.5 g/dL (ref 30.0–36.0)
MCV: 90.2 fL (ref 80.0–100.0)
Platelets: 236 K/uL (ref 150–400)
RBC: 4.57 MIL/uL (ref 3.87–5.11)
RDW: 14.2 % (ref 11.5–15.5)
WBC: 5.3 K/uL (ref 4.0–10.5)
nRBC: 0 % (ref 0.0–0.2)

## 2024-06-16 LAB — COMPREHENSIVE METABOLIC PANEL WITH GFR
ALT: 15 U/L (ref 0–44)
AST: 22 U/L (ref 15–41)
Albumin: 4.3 g/dL (ref 3.5–5.0)
Alkaline Phosphatase: 55 U/L (ref 38–126)
Anion gap: 14 (ref 5–15)
BUN: 13 mg/dL (ref 8–23)
CO2: 22 mmol/L (ref 22–32)
Calcium: 9.6 mg/dL (ref 8.9–10.3)
Chloride: 99 mmol/L (ref 98–111)
Creatinine, Ser: 0.94 mg/dL (ref 0.44–1.00)
GFR, Estimated: >60 mL/min (ref >60)
Glucose, Bld: 94 mg/dL (ref 70–99)
Potassium: 4.1 mmol/L (ref 3.5–5.1)
Sodium: 135 mmol/L (ref 135–145)
Total Bilirubin: 1.3 mg/dL — ABNORMAL HIGH (ref 0.0–1.2)
Total Protein: 7.6 g/dL (ref 6.5–8.1)

## 2024-06-16 LAB — RESP PANEL BY RT-PCR (RSV, FLU A&B, COVID)  RVPGX2
Influenza A by PCR: NEGATIVE
Influenza B by PCR: NEGATIVE
Resp Syncytial Virus by PCR: NEGATIVE
SARS Coronavirus 2 by RT PCR: NEGATIVE

## 2024-06-16 LAB — LIPASE, BLOOD: Lipase: 27 U/L (ref 11–51)

## 2024-06-16 MED ORDER — ACETAMINOPHEN 325 MG PO TABS
650.0000 mg | ORAL_TABLET | Freq: Once | ORAL | Status: AC
  Administered 2024-06-16: 650 mg via ORAL
  Filled 2024-06-16: qty 2

## 2024-06-16 MED ORDER — CLOTRIMAZOLE-BETAMETHASONE 1-0.05 % EX CREA: 1.0000 | TOPICAL_CREAM | Freq: Every day | CUTANEOUS | 5 refills | Status: DC

## 2024-06-16 MED ORDER — CEPHALEXIN 500 MG PO CAPS: 500.0000 mg | ORAL_CAPSULE | Freq: Three times a day (TID) | ORAL | 0 refills | Status: DC

## 2024-06-16 MED ORDER — SODIUM CHLORIDE 0.9 % IV BOLUS
1000.0000 mL | Freq: Once | INTRAVENOUS | Status: AC
  Administered 2024-06-16: 1000 mL via INTRAVENOUS

## 2024-06-16 MED ORDER — SODIUM CHLORIDE 0.9 % IV SOLN
1.0000 g | Freq: Once | INTRAVENOUS | Status: AC
  Administered 2024-06-16: 1 g via INTRAVENOUS
  Filled 2024-06-16: qty 10

## 2024-06-16 NOTE — Patient Instructions (Addendum)
      Medications changes include :   cream as needed for under breasts

## 2024-06-16 NOTE — ED Provider Notes (Signed)
 Clinical Course as of 06/16/24 1647  Fri Jun 16, 2024  1511 Coming in for tremor. Febrile here. UTI. Keflex . CXR with mass. Pending CT scan of chest.  [HD]  1636 CT Chest Wo Contrast CT chest demonstrates there is a previous opacity is not there and this was secondary to irritation.  Will reassess the patient for possible discharge [HD]  1644 Patient reassessed feels comfortable returning home at this point.  She has already received the prescription for Keflex .  She has a good relationship with her primary care physician who follow-up.  She will return with any acutely worsening symptoms.  I will make sure that the urine is sent for urine culture [HD]    Clinical Course User Index [HD] Nicholaus Rolland BRAVO, MD   At time of discharge there is no evidence of acute life, limb, vision, or fertility threat. Patient has stable vital signs, pain is well controlled, patient is ambulatory and p.o. tolerant.  Discharge instructions were completed using the [employer] system. I would refer you to those at this time. All warnings prescriptions follow-up etc. were discussed in detail with the patient. Patient indicates understanding and is agreeable with this plan. All questions answered.  Patient is made aware that they may return to the emergency department for any worsening or new condition or for any other emergency.    Nicholaus Rolland BRAVO, MD 06/16/24 [phone]

## 2024-06-16 NOTE — ED Provider Notes (Signed)
 [facility] Provider Note    Event Date/Time   First MD Initiated Contact with Patient 06/16/24 1214     (approximate)  History   Chief Complaint: Tremors  HPI  Melissa Lowe is a 83 y.o. female with a past medical history of asthma, essential tremor, hyperlipidemia, recently recovered from diverticulitis in July who presents to the emergency department for tremor and fever.  Patient has a history of an essential tremor mostly affecting her left arm, states she had not been feeling very well and was having some slight abdominal pain yesterday so she saw her primary care doctor today had a reassuring workup and was sent home.  Patient states later in the day today she began experiencing severe shaking/tremor of both of her arms which is atypical for her.  They came to the emergency department and found to be febrile to 100.6 slight tachycardia.  Patient states she had mild amount of abdominal pain yesterday but has not had any abdominal pain today.  Denies any cough or congestion.  No urinary symptoms.  Physical Exam   Triage Vital Signs: ED Triage Vitals  Encounter Vitals Group     BP 06/16/24 1209 (!) 166/83     Girls Systolic BP Percentile --      Girls Diastolic BP Percentile --      Boys Systolic BP Percentile --      Boys Diastolic BP Percentile --      Pulse Rate 06/16/24 1209 (!) 102     Resp 06/16/24 1209 18     Temp 06/16/24 1209 (!) 100.6 F (38.1 C)     Temp src --      SpO2 06/16/24 1209 100 %     Weight 06/16/24 1211 176 lb (79.8 kg)     Height 06/16/24 1211 5' 5 (1.651 m)     Head Circumference --      Peak Flow --      Pain Score 06/16/24 1208 0     Pain Loc --      Pain Education --      Exclude from Growth Chart --     Most recent vital signs: Vitals:   06/16/24 1209  BP: (!) 166/83  Pulse: (!) 102  Resp: 18  Temp: (!) 100.6 F (38.1 C)  SpO2: 100%    General: Awake, no distress.  CV:  Good peripheral perfusion.   Regular rate and rhythm  Resp:  Normal effort.  Equal breath sounds bilaterally.  Abd:  No distention.  Soft, nontender.  No rebound or guarding.  Benign abdomen.  ED Results / Procedures / Treatments   RADIOLOGY  I reviewed interpret the chest x-ray images.  No obvious consolidation on my evaluation. Radiology has read possible mass measuring up to 4.3 cm in diameter.   MEDICATIONS ORDERED IN ED: Medications  acetaminophen  (TYLENOL ) tablet 650 mg (has no administration in time range)  sodium chloride  0.9 % bolus 1,000 mL (has no administration in time range)     IMPRESSION / MDM / ASSESSMENT AND PLAN / ED COURSE  I reviewed the triage vital signs and the nursing notes.  Patient's presentation is most consistent with acute presentation with potential threat to life or bodily function.  Patient presents to the emergency department for tremor found to be febrile to 100.6 and slightly tachycardic at 102.  Patient's lab work today is so far reassuring with a normal CBC with a normal white blood cell count reassuring  chemistry and normal LFTs and lipase.  Given the patient's fever of unknown origin we will proceed with a urinalysis chest x-ray and a respiratory panel.  Given the patient's abdominal pain yesterday recently recovered from diverticulitis if the patient's workup is otherwise normal we will proceed with a CT of the abdomen and pelvis to evaluate for diverticulitis recurrence or possible abscess.  Patient agreeable to plan of care.  We will IV hydrate, treat with Tylenol  while awaiting results.  Patient is awake alert oriented gives a good history.  Besides the shaking/tremor she has no active complaints.  Patient's workup shows a negative respiratory panel, reassuring chemistry with normal LFTs, normal lipase, normal CBC with a normal white blood cell count.  Patient's urinalysis shows greater than 50 white cells with many bacteria white blood cell clumps and nitrite positive  indicating significant urinary tract infection highly suspect this to be the cause of the patient's fever, tremor/rigors.  We will send urine culture we will dose IV Rocephin .  Will plan to discharge on Keflex , patient is agreeable to this plan.  Discussed return precautions for any worsening symptoms.  However patient's chest x-ray today shows the possibility of a 4.3 cm mass in the right upper lung.  We will obtain a CT scan of the chest to further evaluate.  Patient agreeable.  FINAL CLINICAL IMPRESSION(S) / ED DIAGNOSES   Fever Urinary tract infection  Note:  This document was prepared using Dragon voice recognition software and may include unintentional dictation errors.   Dorothyann Drivers, MD 06/16/24 [phone]

## 2024-06-16 NOTE — ED Triage Notes (Addendum)
 Pt comes with tremors that started about 20 minutes ago. Pt states hx of tremors but never had this before. Pt shaking while in triage.   Pt went to her pcp today for regular checkup after having some pain in lower abdomen and some diarrhea and had low grade temp. Pt states back in July she has diverticulitis and was admitted to hospital. Pt BP was fine. Pt left there and on way to meet friends for lunch this all started.

## 2024-06-16 NOTE — Assessment & Plan Note (Signed)
 Acute Started yesterday when she woke up in the morning-initially moderate and improved as the day went on and resolved Did have an episode of diarrhea yesterday which is unusual for her, but normal bowel movement after that Pain has resolved, but has a slight tenderness in the left mid-lower region of her abdomen No fever, chills, gas or bloating I do not think she is having recurrent diverticulitis, but we need to watch carefully over the next few days-hopefully this was just a single episode of bowel irritation and diarrhea She will continue low fiber diet, lots of fluids She will update me if this pain recurs

## 2024-06-16 NOTE — ED Notes (Signed)
 Pt getting sick in triage and almost vomited.

## 2024-06-16 NOTE — Discharge Instructions (Addendum)
 You were seen in the emergency department for rigors.  Please alternate between over-the-counter medication ibuprofen  and acetaminophen  with food in your stomach as directed by the bottle.  Ensure adequate hydration.  Take your antibiotics as prescribed.  Return if any acutely worsening symptoms or any other emergency. -- RETURN PRECAUTIONS & AFTERCARE: (ENGLISH) RETURN PRECAUTIONS: Return immediately to the emergency department or see/call your doctor if you feel worse, weak or have changes in speech or vision, are short of breath, have fever, vomiting, pain, bleeding or dark stool, trouble urinating or any new issues. Return here or see/call your doctor if not improving as expected for your suspected condition. FOLLOW-UP CARE: Call your doctor and/or any doctors we referred you to for more advice and to make an appointment. Do this today, tomorrow or after the weekend. Some doctors only take PPO insurance so if you have HMO insurance you may want to contact your HMO or your regular doctor for referral to a specialist within your plan. Either way tell the doctor's office that it was a referral from the emergency department so you get the soonest possible appointment.  YOUR TEST RESULTS: Take result reports of any blood or urine tests, imaging tests and EKG's to your doctor and any referral doctor. Have any abnormal tests repeated. Your doctor or a referral doctor can let you know when this should be done. Also make sure your doctor contacts this hospital to get any test results that are not currently available such as cultures or special tests for infection and final imaging reports, which are often not available at the time you leave the ER but which may list additional important findings that are not documented on the preliminary report. BLOOD PRESSURE: If your blood pressure was greater than 120/80 have your blood pressure rechecked within 1 to 2 weeks. MEDICATION SIDE EFFECTS: Do not drive, walk, bike,  take the bus, etc. if you have received or are being prescribed any sedating medications such as those for pain or anxiety or certain antihistamines like Benadryl . If you have been give one of these here get a taxi home or have a friend drive you home. Ask your pharmacist to counsel you on potential side effects of any new medication

## 2024-06-18 LAB — URINE CULTURE: Culture: >=100000 — AB

## 2024-06-22 ENCOUNTER — Encounter: Payer: Self-pay | Admitting: Internal Medicine

## 2024-06-22 DIAGNOSIS — N390 Urinary tract infection, site not specified: Secondary | ICD-10-CM | POA: Insufficient documentation

## 2024-06-22 DIAGNOSIS — J479 Bronchiectasis, uncomplicated: Secondary | ICD-10-CM | POA: Insufficient documentation

## 2024-06-22 NOTE — Progress Notes (Unsigned)
 Subjective:    Patient ID: Melissa Lowe, female    DOB: 01-05-1941, 83 y.o.   MRN: [id number]     HPI Melissa Lowe is here for follow up from the ED  8/22 - after leaving here she did not feel well driving home and went to the ED - she was having increased shaking/tremor.  Temp in the ED was 100.6, slight tachycardia.  Cbc, cmp, lipase. UA showed a UTI. She received a dose of IV rocephin  and was discharged on keflex .  CXR showed possible 4.3 cm mass in RUL - Ct chest done - no mass seen.  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  Bilirubin minimally increased.   Ucx - > 100,000 Ecoli - completes keflex  today.   This morning she started having dysuria.     Stool this morning was pencil thin - that is unusual.     Medications and allergies reviewed with patient and updated if appropriate.  Current Outpatient Medications on File Prior to Visit  Medication Sig Dispense Refill   albuterol  (VENTOLIN  HFA) 108 (90 Base) MCG/ACT inhaler USE 1 INHALATION BY MOUTH EVERY  6 HOURS AS NEEDED FOR WHEEZING  OR SHORTNESS OF BREATH 36 g 3   ascorbic acid  (VITAMIN C ) 500 MG tablet Take 500 mg by mouth daily.     budesonide -formoterol  (SYMBICORT ) 80-4.5 MCG/ACT inhaler Inhale 2 puffs into the lungs 2 (two) times daily. (Patient taking differently: Inhale 1 puff into the lungs daily.) 30.6 g 11   cephALEXin  (KEFLEX ) 500 MG capsule Take 1 capsule (500 mg total) by mouth 3 (three) times daily. 21 capsule 0   clotrimazole -betamethasone  (LOTRISONE ) cream Apply 1 Application topically daily. 30 g 5   ELDERBERRY PO Take 2 tablets by mouth daily.     estradiol  (ESTRACE ) 0.1 MG/GM vaginal cream Place 0.5g twice a week 42.5 g 11   famotidine  (PEPCID ) 20 MG tablet Take 40 mg by mouth daily.      fluticasone  (FLONASE ) 50 MCG/ACT nasal spray Place 1 spray into both nostrils daily. 1-2 sprays in the left nostril daily     levothyroxine  (SYNTHROID ) 50 MCG tablet  TAKE 1 TABLET BY MOUTH IN THE  MORNING 90 tablet 3   montelukast  (SINGULAIR ) 10 MG tablet Take 1 tablet (10 mg total) by mouth daily. 90 tablet 3   pravastatin  (PRAVACHOL ) 40 MG tablet Take 1 tablet (40 mg total) by mouth at bedtime. 90 tablet 3   primidone  (MYSOLINE ) 50 MG tablet Take 1/2 tablet at night 90 tablet 1   Vitamin D , Cholecalciferol , 25 MCG (1000 UT) CAPS Take 1,000 Units by mouth daily.     No current facility-administered medications on file prior to visit.     Review of Systems  Constitutional:  Negative for chills and fever.  Gastrointestinal:  Negative for abdominal pain and nausea.  Genitourinary:  Positive for difficulty urinating (since surgery - has flow, stops, has flow, stops, has flow), dysuria and frequency (drinking a lot of water). Negative for hematuria.       Urine is clear       Objective:   Vitals:   06/23/24 0918  BP: 114/74  Pulse: 74  Temp: 98.3 F (36.8 C)  SpO2: 97%   BP Readings from Last 3 Encounters:  06/23/24 114/74  06/16/24 120/63  06/16/24 110/80   Wt Readings from Last 3 Encounters:  06/23/24 172 lb (78 kg)  06/16/24 176 lb (79.8  kg)  06/16/24 174 lb (78.9 kg)   Body mass index is 28.62 kg/m.    Physical Exam Constitutional:      General: She is not in acute distress.    Appearance: Normal appearance. She is not ill-appearing.  HENT:     Head: Normocephalic and atraumatic.  Abdominal:     General: There is no distension.     Palpations: Abdomen is soft.     Tenderness: There is abdominal tenderness (minimal tenderness in LLQ in one focal area). There is no guarding or rebound.  Musculoskeletal:     Right lower leg: No edema.     Left lower leg: No edema.  Skin:    General: Skin is warm and dry.     Findings: No rash.  Neurological:     Mental Status: She is alert.        Lab Results  Component Value Date   WBC 5.3 06/16/2024   HGB 13.4 06/16/2024   HCT 41.2 06/16/2024   PLT 236 06/16/2024   GLUCOSE 94  06/16/2024   CHOL 156 02/22/2024   TRIG 131.0 02/22/2024   HDL 64.30 02/22/2024   LDLDIRECT 115.0 07/05/2019   LDLCALC 66 02/22/2024   ALT 15 06/16/2024   AST 22 06/16/2024   NA 135 06/16/2024   K 4.1 06/16/2024   CL 99 06/16/2024   CREATININE 0.94 06/16/2024   BUN 13 06/16/2024   CO2 22 06/16/2024   TSH 2.09 02/22/2024   HGBA1C 5.8 02/22/2024   CT Chest Wo Contrast CLINICAL DATA:  Right upper lobe lung density on chest radiography, for further characterization  EXAM: CT CHEST WITHOUT CONTRAST  TECHNIQUE: Multidetector CT imaging of the chest was performed following the standard protocol without IV contrast.  RADIATION DOSE REDUCTION: This exam was performed according to the departmental dose-optimization program which includes automated exposure control, adjustment of the mA and/or kV according to patient size and/or use of iterative reconstruction technique.  COMPARISON:  Radiograph 06/16/2024; prior abdomen and coronary CT exams from 05/10/2024 and 07/15/2023  FINDINGS: Cardiovascular: Coronary, aortic arch, and branch vessel atherosclerotic vascular disease.  Mediastinum/Nodes: Small type 1 hiatal hernia.  Lungs/Pleura: The right lung appears clear in the region of prior density on radiography. Presumably the density represented rotational effects along with superimposition of scapular in right third rib bony structures. No specific worrisome finding in this vicinity.  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 Mild scarring posteriorly in the right middle lobe. Dependent subsegmental atelectasis in both lower lobes.  Upper Abdomen: Cholecystectomy.  Abdominal aortic atherosclerosis.  Musculoskeletal: Thoracic spondylosis.  IMPRESSION: 1. The right lung appears clear in the region of prior density on radiography. Presumably the density represented rotational exacerbating chest wall density along with  superimposition of scapular and right third rib bony structures. No specific worrisome finding in this vicinity. 2. 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 3. Small type 1 hiatal hernia. 4. Thoracic spondylosis. 5.  Aortic Atherosclerosis (ICD10-I70.0).  Electronically Signed   By: Ryan Salvage M.D.   On: 06/16/2024 16:29 DG Chest Portable 1 View CLINICAL DATA:  Fever, tremors  EXAM: PORTABLE CHEST 1 VIEW  COMPARISON:  12/13/2018  FINDINGS: The patient is rotated to the right on today's radiograph, reducing diagnostic sensitivity and specificity. Atheromatous vascular calcification of the thoracic aorta.  Hazy density peripherally in the right upper chest projects over the right anterior third rib and  right scapula measures up to 4.3 cm in diameter. Cannot exclude pneumonia or mass.  No blunting of the costophrenic angles.  IMPRESSION: 1. Hazy density peripherally in the right upper chest projects over the right anterior third rib and right scapula measures up to 4.3 cm in diameter. Cannot exclude pneumonia or mass. Consider chest CT further characterization; or alternatively followup PA and lateral chest X-ray is recommended in 3-4 weeks following trial of antibiotic therapy to ensure resolution and exclude underlying malignancy. 2. Atheromatous vascular calcification of the thoracic aorta.  Electronically Signed   By: Ryan Salvage M.D.   On: 06/16/2024 14:34    Assessment & Plan:    See Problem List for Assessment and Plan of chronic medical problems.

## 2024-06-22 NOTE — Patient Instructions (Incomplete)
      Medications changes include :   None    A referral was ordered and someone will call you to schedule an appointment.

## 2024-06-23 ENCOUNTER — Encounter: Payer: Self-pay | Admitting: Neurology

## 2024-06-23 ENCOUNTER — Ambulatory Visit: Admitting: Internal Medicine

## 2024-06-23 ENCOUNTER — Ambulatory Visit: Payer: Self-pay | Admitting: Internal Medicine

## 2024-06-23 VITALS — BP 114/74 | HR 74 | Temp 98.3°F | Ht 65.0 in | Wt 172.0 lb

## 2024-06-23 DIAGNOSIS — J45909 Unspecified asthma, uncomplicated: Secondary | ICD-10-CM | POA: Diagnosis not present

## 2024-06-23 DIAGNOSIS — K449 Diaphragmatic hernia without obstruction or gangrene: Secondary | ICD-10-CM | POA: Insufficient documentation

## 2024-06-23 DIAGNOSIS — R300 Dysuria: Secondary | ICD-10-CM

## 2024-06-23 DIAGNOSIS — J479 Bronchiectasis, uncomplicated: Secondary | ICD-10-CM

## 2024-06-23 DIAGNOSIS — N3000 Acute cystitis without hematuria: Secondary | ICD-10-CM

## 2024-06-23 LAB — URINALYSIS, ROUTINE W REFLEX MICROSCOPIC
Bilirubin Urine: NEGATIVE
Ketones, ur: NEGATIVE
Leukocytes,Ua: NEGATIVE
Nitrite: NEGATIVE
Specific Gravity, Urine: 1.01 (ref 1.000–1.030)
Total Protein, Urine: NEGATIVE
Urine Glucose: NEGATIVE
Urobilinogen, UA: 0.2 (ref 0.0–1.0)
pH: 6 (ref 5.0–8.0)

## 2024-06-23 NOTE — Assessment & Plan Note (Addendum)
 Recent CT scan from the emergency room showed bronchiectasis in both lower lobes and right middle lobe, lingula Has a history of asthma, bronchitis Continue Symbicort  twice daily, albuterol  as needed - asthma is well controlled Since asymptomatic will hold off on pulm referral and just monitor

## 2024-06-23 NOTE — Assessment & Plan Note (Signed)
 Chronic Overall well controlled Ct chest with bronchiectasis Continue Symbicort  80-4.5 mcg/act bid Continue albuterol  inhaler as needed Continue montelukast  10 mg nightly

## 2024-06-23 NOTE — Assessment & Plan Note (Addendum)
 Acute Diagnosed 8/22-completes 7 days of Keflex  500 mg 3 times daily today Had some dysuria this morning Denies other concerning urinary symptoms, but did not have energy when she was diagnosed-only symptoms was right lower quadrant discomfort and then developed fever and worsening of her tremors Advise staying well-hydrated Can take a urinary track prevention vitamin Start vaginal estrogen cream twice weekly UA, UCX today to confirm successfully treated

## 2024-06-24 LAB — URINE CULTURE: Result:: NO GROWTH

## 2024-06-27 ENCOUNTER — Encounter: Payer: Self-pay | Admitting: Internal Medicine

## 2024-07-05 ENCOUNTER — Encounter: Payer: Self-pay | Admitting: Internal Medicine

## 2024-07-06 ENCOUNTER — Other Ambulatory Visit: Payer: Self-pay

## 2024-07-06 ENCOUNTER — Other Ambulatory Visit (HOSPITAL_BASED_OUTPATIENT_CLINIC_OR_DEPARTMENT_OTHER): Payer: Self-pay

## 2024-07-06 MED ORDER — COMIRNATY 30 MCG/0.3ML IM SUSY
0.3000 mL | PREFILLED_SYRINGE | Freq: Once | INTRAMUSCULAR | 0 refills | Status: AC
  Filled 2024-07-06 – 2024-07-07 (×2): qty 0.3, 1d supply, fill #0

## 2024-07-07 ENCOUNTER — Other Ambulatory Visit (HOSPITAL_BASED_OUTPATIENT_CLINIC_OR_DEPARTMENT_OTHER): Payer: Self-pay

## 2024-07-07 MED ORDER — FLUZONE HIGH-DOSE 0.5 ML IM SUSY
0.5000 mL | PREFILLED_SYRINGE | Freq: Once | INTRAMUSCULAR | 0 refills | Status: AC
  Filled 2024-07-07: qty 0.5, 1d supply, fill #0

## 2024-07-11 ENCOUNTER — Encounter: Payer: Self-pay | Admitting: Neurology

## 2024-07-28 ENCOUNTER — Encounter: Payer: Self-pay | Admitting: Internal Medicine

## 2024-08-23 ENCOUNTER — Other Ambulatory Visit: Payer: Self-pay | Admitting: Internal Medicine

## 2024-08-25 ENCOUNTER — Encounter: Admitting: Internal Medicine

## 2024-08-27 ENCOUNTER — Encounter: Payer: Self-pay | Admitting: Internal Medicine

## 2024-08-27 NOTE — Patient Instructions (Addendum)
 [facility] - Inocente Hausen, MD, Dr Gordy Starch   Blood work was ordered.       Medications changes include :   None     Return in about 6 months (around 02/25/2025) for follow up.    Health Maintenance, Female Adopting a healthy lifestyle and getting preventive care are important in promoting health and wellness. Ask your health care provider about: The right schedule for you to have regular tests and exams. Things you can do on your own to prevent diseases and keep yourself healthy. What should I know about diet, weight, and exercise? Eat a healthy diet  Eat a diet that includes plenty of vegetables, fruits, low-fat dairy products, and lean protein. Do not eat a lot of foods that are high in solid fats, added sugars, or sodium. Maintain a healthy weight Body mass index (BMI) is used to identify weight problems. It estimates body fat based on height and weight. Your health care provider can help determine your BMI and help you achieve or maintain a healthy weight. Get regular exercise Get regular exercise. This is one of the most important things you can do for your health. Most adults should: Exercise for at least 150 minutes each week. The exercise should increase your heart rate and make you sweat (moderate-intensity exercise). Do strengthening exercises at least twice a week. This is in addition to the moderate-intensity exercise. Spend less time sitting. Even light physical activity can be beneficial. Watch cholesterol and blood lipids Have your blood tested for lipids and cholesterol at 81 years of age, then have this test every 5 years. Have your cholesterol levels checked more often if: Your lipid or cholesterol levels are high. You are older than 81 years of age. You are at high risk for heart disease. What should I know about cancer screening? Depending on your health history and family history, you may need to have cancer screening at various ages. This may include  screening for: Breast cancer. Cervical cancer. Colorectal cancer. Skin cancer. Lung cancer. What should I know about heart disease, diabetes, and high blood pressure? Blood pressure and heart disease High blood pressure causes heart disease and increases the risk of stroke. This is more likely to develop in people who have high blood pressure readings or are overweight. Have your blood pressure checked: Every 3-5 years if you are 83-33 years of age. Every year if you are 63 years old or older. Diabetes Have regular diabetes screenings. This checks your fasting blood sugar level. Have the screening done: Once every three years after age 46 if you are at a normal weight and have a low risk for diabetes. More often and at a younger age if you are overweight or have a high risk for diabetes. What should I know about preventing infection? Hepatitis B If you have a higher risk for hepatitis B, you should be screened for this virus. Talk with your health care provider to find out if you are at risk for hepatitis B infection. Hepatitis C Testing is recommended for: Everyone born from [birth year] through 1965. Anyone with known risk factors for hepatitis C. Sexually transmitted infections (STIs) Get screened for STIs, including gonorrhea and chlamydia, if: You are sexually active and are younger than 81 years of age. You are older than 81 years of age and your health care provider tells you that you are at risk for this type of infection. Your sexual activity has changed since you were last screened, and  you are at increased risk for chlamydia or gonorrhea. Ask your health care provider if you are at risk. Ask your health care provider about whether you are at high risk for HIV. Your health care provider may recommend a prescription medicine to help prevent HIV infection. If you choose to take medicine to prevent HIV, you should first get tested for HIV. You should then be tested every 3 months for as  long as you are taking the medicine. Pregnancy If you are about to stop having your period (premenopausal) and you may become pregnant, seek counseling before you get pregnant. Take 400 to 800 micrograms (mcg) of folic acid every day if you become pregnant. Ask for birth control (contraception) if you want to prevent pregnancy. Osteoporosis and menopause Osteoporosis is a disease in which the bones lose minerals and strength with aging. This can result in bone fractures. If you are 27 years old or older, or if you are at risk for osteoporosis and fractures, ask your health care provider if you should: Be screened for bone loss. Take a calcium or vitamin D  supplement to lower your risk of fractures. Be given hormone replacement therapy (HRT) to treat symptoms of menopause. Follow these instructions at home: Alcohol use Do not drink alcohol if: Your health care provider tells you not to drink. You are pregnant, may be pregnant, or are planning to become pregnant. If you drink alcohol: Limit how much you have to: 0-1 drink a day. Know how much alcohol is in your drink. In the [place] one drink equals one 12 oz bottle of beer (355 mL), one 5 oz glass of wine (148 mL), or one 1 oz glass of hard liquor (44 mL). Lifestyle Do not use any products that contain nicotine or tobacco. These products include cigarettes, chewing tobacco, and vaping devices, such as e-cigarettes. If you need help quitting, ask your health care provider. Do not use street drugs. Do not share needles. Ask your health care provider for help if you need support or information about quitting drugs. General instructions Schedule regular health, dental, and eye exams. Stay current with your vaccines. Tell your health care provider if: You often feel depressed. You have ever been abused or do not feel safe at home. Summary Adopting a healthy lifestyle and getting preventive care are important in promoting health and  wellness. Follow your health care provider's instructions about healthy diet, exercising, and getting tested or screened for diseases. Follow your health care provider's instructions on monitoring your cholesterol and blood pressure. This information is not intended to replace advice given to you by your health care provider. Make sure you discuss any questions you have with your health care provider. Document Revised: 03/03/2021 Document Reviewed: 03/03/2021 Elsevier Patient Education  2024 [employer].

## 2024-08-27 NOTE — Progress Notes (Unsigned)
 Subjective:    Patient ID: Melissa Lowe, female    DOB: 07/03/1941, 83 y.o.   MRN: [id number]      HPI Melissa Lowe is here for a Physical exam and her chronic medical problems.   Overall doing well.  Chronic medical problems are stable.  She continues to have chronic knee pain from arthritis.   Medications and allergies reviewed with patient and updated if appropriate.  Current Outpatient Medications on File Prior to Visit  Medication Sig Dispense Refill   albuterol  (VENTOLIN  HFA) 108 (90 Base) MCG/ACT inhaler USE 1 INHALATION BY MOUTH EVERY  6 HOURS AS NEEDED FOR WHEEZING  OR SHORTNESS OF BREATH 36 g 3   ascorbic acid  (VITAMIN C ) 500 MG tablet Take 500 mg by mouth daily.     budesonide -formoterol  (SYMBICORT ) 80-4.5 MCG/ACT inhaler Inhale 2 puffs into the lungs 2 (two) times daily. (Patient taking differently: Inhale 1 puff into the lungs daily.) 30.6 g 11   ELDERBERRY PO Take 2 tablets by mouth daily.     estradiol  (ESTRACE ) 0.1 MG/GM vaginal cream Place 0.5g twice a week 42.5 g 11   famotidine  (PEPCID ) 20 MG tablet Take 40 mg by mouth daily.      fluticasone  (FLONASE ) 50 MCG/ACT nasal spray Place 1 spray into both nostrils daily. 1-2 sprays in the left nostril daily     levothyroxine  (SYNTHROID ) 50 MCG tablet TAKE 1 TABLET BY MOUTH IN THE  MORNING 90 tablet 3   montelukast  (SINGULAIR ) 10 MG tablet TAKE 1 TABLET BY MOUTH DAILY 90 tablet 3   pravastatin  (PRAVACHOL ) 40 MG tablet TAKE 1 TABLET BY MOUTH AT  BEDTIME 90 tablet 3   primidone  (MYSOLINE ) 50 MG tablet Take 1/2 tablet at night 90 tablet 1   Vitamin D , Cholecalciferol , 25 MCG (1000 UT) CAPS Take 1,000 Units by mouth daily.     No current facility-administered medications on file prior to visit.    Review of Systems  Constitutional:  Negative for fever.  HENT:  Positive for rhinorrhea.   Eyes:  Negative for visual disturbance.  Respiratory:  Negative for cough, shortness of breath and wheezing.   Cardiovascular:   Positive for chest pain (rarely pain in left chest). Negative for palpitations and leg swelling.  Gastrointestinal:  Negative for abdominal pain, blood in stool, constipation and diarrhea.       No gerd - controlled with pepcid   Genitourinary:  Negative for dysuria.  Musculoskeletal:  Positive for arthralgias (knees) and back pain (chronic left mid back pain).  Skin:  Negative for rash.  Neurological:  Negative for light-headedness and headaches.  Psychiatric/Behavioral:  Negative for dysphoric mood. The patient is not nervous/anxious.        Objective:   Vitals:   08/28/24 1324  BP: 118/60  Pulse: 65  Temp: 98 F (36.7 C)  SpO2: 99%   Filed Weights   08/28/24 1324  Weight: 180 lb (81.6 kg)   Body mass index is 29.95 kg/m.  BP Readings from Last 3 Encounters:  08/28/24 118/60  06/23/24 114/74  06/16/24 120/63    Wt Readings from Last 3 Encounters:  08/28/24 180 lb (81.6 kg)  06/23/24 172 lb (78 kg)  06/16/24 176 lb (79.8 kg)       Physical Exam Constitutional: She appears well-developed and well-nourished. No distress.  HENT:  Head: Normocephalic and atraumatic.  Right Ear: External ear normal. Normal ear canal and TM Left Ear: External ear normal.  Normal ear canal and TM  Mouth/Throat: Oropharynx is clear and moist.  Eyes: Conjunctivae normal.  Neck: Neck supple. No tracheal deviation present. No thyromegaly present.  No carotid bruit  Cardiovascular: Normal rate, regular rhythm and normal heart sounds.   No murmur heard.  No edema. Pulmonary/Chest: Effort normal and breath sounds normal. No respiratory distress. She has no wheezes. She has no rales.  Breast: deferred   Abdominal: Soft. She exhibits no distension. There is no tenderness.  Lymphadenopathy: She has no cervical adenopathy.  Skin: Skin is warm and dry. She is not diaphoretic.  Psychiatric: She has a normal mood and affect. Her behavior is normal.     Lab Results  Component Value Date    WBC 5.3 06/16/2024   HGB 13.4 06/16/2024   HCT 41.2 06/16/2024   PLT 236 06/16/2024   GLUCOSE 94 06/16/2024   CHOL 156 02/22/2024   TRIG 131.0 02/22/2024   HDL 64.30 02/22/2024   LDLDIRECT 115.0 07/05/2019   LDLCALC 66 02/22/2024   ALT 15 06/16/2024   AST 22 06/16/2024   NA 135 06/16/2024   K 4.1 06/16/2024   CL 99 06/16/2024   CREATININE 0.94 06/16/2024   BUN 13 06/16/2024   CO2 22 06/16/2024   TSH 2.09 02/22/2024   HGBA1C 5.8 02/22/2024         Assessment & Plan:   Physical exam: Screening blood work  ordered Exercise  none Weight  overweight Substance abuse  none   Reviewed recommended immunizations.   Health Maintenance  Topic Date Due   Medicare Annual Wellness (AWV)  09/26/2024 (Originally 07-Mar-1941)   DTaP/Tdap/Td (2 - Td or Tdap) 10/09/2024   COVID-19 Vaccine (9 - 2025-26 season) 01/04/2025   DEXA SCAN  12/29/2027   Pneumococcal Vaccine: 50+ Years  Completed   Influenza Vaccine  Completed   Zoster Vaccines- Shingrix  Completed   Meningococcal B Vaccine  Aged Out   Colonoscopy  Discontinued          See Problem List for Assessment and Plan of chronic medical problems.

## 2024-08-28 ENCOUNTER — Ambulatory Visit: Admitting: Internal Medicine

## 2024-08-28 VITALS — BP 118/60 | HR 65 | Temp 98.0°F | Ht 65.0 in | Wt 180.0 lb

## 2024-08-28 DIAGNOSIS — E038 Other specified hypothyroidism: Secondary | ICD-10-CM | POA: Diagnosis not present

## 2024-08-28 DIAGNOSIS — Z0000 Encounter for general adult medical examination without abnormal findings: Secondary | ICD-10-CM

## 2024-08-28 DIAGNOSIS — J4540 Moderate persistent asthma, uncomplicated: Secondary | ICD-10-CM | POA: Diagnosis not present

## 2024-08-28 DIAGNOSIS — E7800 Pure hypercholesterolemia, unspecified: Secondary | ICD-10-CM | POA: Diagnosis not present

## 2024-08-28 DIAGNOSIS — N182 Chronic kidney disease, stage 2 (mild): Secondary | ICD-10-CM

## 2024-08-28 DIAGNOSIS — R7303 Prediabetes: Secondary | ICD-10-CM | POA: Diagnosis not present

## 2024-08-28 DIAGNOSIS — G250 Essential tremor: Secondary | ICD-10-CM

## 2024-08-28 DIAGNOSIS — N1831 Chronic kidney disease, stage 3a: Secondary | ICD-10-CM

## 2024-08-28 LAB — COMPREHENSIVE METABOLIC PANEL WITH GFR
ALT: 12 U/L (ref 0–35)
AST: 13 U/L (ref 0–37)
Albumin: 4.2 g/dL (ref 3.5–5.2)
Alkaline Phosphatase: 51 U/L (ref 39–117)
BUN: 12 mg/dL (ref 6–23)
CO2: 28 meq/L (ref 19–32)
Calcium: 9.1 mg/dL (ref 8.4–10.5)
Chloride: 101 meq/L (ref 96–112)
Creatinine, Ser: 0.83 mg/dL (ref 0.40–1.20)
GFR: 66.19 mL/min (ref >60.00)
Glucose, Bld: 103 mg/dL — ABNORMAL HIGH (ref 70–99)
Potassium: 3.9 meq/L (ref 3.5–5.1)
Sodium: 136 meq/L (ref 135–145)
Total Bilirubin: 0.4 mg/dL (ref 0.2–1.2)
Total Protein: 6.3 g/dL (ref 6.0–8.3)

## 2024-08-28 LAB — LIPID PANEL
Cholesterol: 153 mg/dL (ref 0–200)
HDL: 60 mg/dL (ref >39.00)
LDL Cholesterol: 45 mg/dL (ref 0–99)
NonHDL: 93.33
Total CHOL/HDL Ratio: 3
Triglycerides: 240 mg/dL — ABNORMAL HIGH (ref 0.0–149.0)
VLDL: 48 mg/dL — ABNORMAL HIGH (ref 0.0–40.0)

## 2024-08-28 LAB — TSH: TSH: 1.67 u[IU]/mL (ref 0.35–5.50)

## 2024-08-28 LAB — CBC
HCT: 37.2 % (ref 36.0–46.0)
Hemoglobin: 12.7 g/dL (ref 12.0–15.0)
MCHC: 34 g/dL (ref 30.0–36.0)
MCV: 88.7 fl (ref 78.0–100.0)
Platelets: 260 K/uL (ref 150.0–400.0)
RBC: 4.19 Mil/uL (ref 3.87–5.11)
RDW: 14.2 % (ref 11.5–15.5)
WBC: 5.4 K/uL (ref 4.0–10.5)

## 2024-08-28 LAB — HEMOGLOBIN A1C: Hgb A1c MFr Bld: 5.8 % (ref 4.6–6.5)

## 2024-08-28 LAB — VITAMIN D 25 HYDROXY (VIT D DEFICIENCY, FRACTURES): VITD: 27.12 ng/mL — ABNORMAL LOW (ref 30.00–100.00)

## 2024-08-28 NOTE — Assessment & Plan Note (Signed)
Chronic Regular exercise and healthy diet encouraged Check lipid panel, CMP Continue pravastatin 40 mg daily

## 2024-08-28 NOTE — Assessment & Plan Note (Signed)
 Chronic  Clinically euthyroid Check tsh and will titrate med dose if needed Currently taking levothyroxine  50 mcg daily

## 2024-08-28 NOTE — Assessment & Plan Note (Signed)
 Chronic Overall well controlled Ct chest with bronchiectasis Continue Symbicort  80-4.5 mcg/act bid Continue albuterol  inhaler as needed Continue montelukast  10 mg nightly

## 2024-08-28 NOTE — Assessment & Plan Note (Signed)
 Chronic Lab Results  Component Value Date   HGBA1C 5.8 02/22/2024   Check a1c Low sugar / carb diet Stressed regular exercise

## 2024-08-28 NOTE — Assessment & Plan Note (Signed)
 Chronic Has seen nephrology-no obvious cause for CKD Mild Stable/improved CMP, CBC

## 2024-08-28 NOTE — Assessment & Plan Note (Signed)
 Chronic Following with neurology-Dr. Tat On primidone  25 mg daily

## 2024-08-29 ENCOUNTER — Ambulatory Visit: Payer: Self-pay | Admitting: Internal Medicine

## 2024-08-29 ENCOUNTER — Encounter: Payer: Self-pay | Admitting: Internal Medicine

## 2024-09-12 ENCOUNTER — Encounter: Payer: Self-pay | Admitting: Obstetrics and Gynecology

## 2024-09-12 ENCOUNTER — Ambulatory Visit (INDEPENDENT_AMBULATORY_CARE_PROVIDER_SITE_OTHER): Admitting: Obstetrics and Gynecology

## 2024-09-12 VITALS — BP 120/76 | HR 76

## 2024-09-12 DIAGNOSIS — N8110 Cystocele, unspecified: Secondary | ICD-10-CM | POA: Diagnosis not present

## 2024-09-12 DIAGNOSIS — R350 Frequency of micturition: Secondary | ICD-10-CM | POA: Diagnosis not present

## 2024-09-12 DIAGNOSIS — N3281 Overactive bladder: Secondary | ICD-10-CM | POA: Diagnosis not present

## 2024-09-12 LAB — POCT URINALYSIS DIP (CLINITEK)
Bilirubin, UA: NEGATIVE
Blood, UA: NEGATIVE
Glucose, UA: NEGATIVE mg/dL
Nitrite, UA: NEGATIVE
POC PROTEIN,UA: NEGATIVE
Spec Grav, UA: 1.025 (ref 1.010–1.025)
Urobilinogen, UA: 0.2 U/dL
pH, UA: 6 (ref 5.0–8.0)

## 2024-09-12 NOTE — Assessment & Plan Note (Signed)
 Stage 1 anterior prolapse, not significantly changed from a few months ago.  - She is most bothered by her bladder urgency symptoms. We discussed that a pessary or repeat surgery will not change these symptoms.

## 2024-09-12 NOTE — Patient Instructions (Addendum)
Today we talked about ways to manage bladder urgency such as altering your diet to avoid irritative beverages and foods (bladder diet) as well as attempting to decrease stress and other exacerbating factors.    The Most Bothersome Foods* The Least Bothersome Foods*  Coffee - Regular & Decaf Tea - caffeinated Carbonated beverages - cola, non-colas, diet & caffeine-free Alcohols - Beer, Red Wine, White Wine, Champagne Fruits - Grapefruit, Lemon, Orange, Pineapple Fruit Juices - Cranberry, Grapefruit, Orange, Pineapple Vegetables - Tomato & Tomato Products Flavor Enhancers - Hot peppers, Spicy foods, Chili, Horseradish, Vinegar, Monosodium glutamate (MSG) Artificial Sweeteners - NutraSweet, Sweet 'N Low, Equal (sweetener), Saccharin Ethnic foods - Mexican, Thai, Indian food Water Milk - low-fat & whole Fruits - Bananas, Blueberries, Honeydew melon, Pears, Raisins, Watermelon Vegetables - Broccoli, Brussels Sprouts, Cabbage, Carrots, Cauliflower, Celery, Cucumber, Mushrooms, Peas, Radishes, Squash, Zucchini, White potatoes, Sweet potatoes & yams Poultry - Chicken, Eggs, Turkey, Meat - Beef, Pork, Lamb Seafood - Shrimp, Tuna fish, Salmon Grains - Oat, Rice Snacks - Pretzels, Popcorn  *Friedlander J. et al. Diet and its role in interstitial cystitis/bladder pain syndrome (IC/BPS) and comorbid conditions. [employer]. [employer]. 2012 Jan 11.   We discussed the symptoms of overactive bladder (OAB), which include urinary urgency, urinary frequency, night-time urination, with or without urge incontinence.  We discussed management including behavioral therapy (decreasing bladder irritants by following a bladder diet, urge suppression strategies, timed voids, bladder retraining), physical therapy, medication; and for refractory cases posterior tibial nerve stimulation, sacral neuromodulation, and intravesical botulinum toxin injection.   

## 2024-09-12 NOTE — Progress Notes (Signed)
 [facility] Return Visit  SUBJECTIVE  History of Present Illness: Melissa Lowe is a 83 y.o. female seen in follow-up for prolapse and urinary frequency.  s/p anterior repair, cystoscopy on 03/27/24   Has been trying to do the pelvic floor exercises since the surgery. About 2 months ago, felt like her bladder has not been emptying well. When she is showering, she feels a bit of a bulge.   Usually wakes up about twice per night to urinate. Feels she has some urinary hesitancy. During the day, she urinates and then feels like she has to urinate again shortly after.   She had a UTI in August. She went to the ER on 8/22. She had been using the vaginal estrogen cream but she was concerned about the safety warning.   She is drinking peach mango tea crystal light at lunch, otherwise about 7 glasses of water per day. Rare glass of wine.   Past Medical History: Patient  has a past medical history of Allergy, Aortic atherosclerosis (12/30/2023), Asthma, Chest pain of uncertain etiology (02/24/2023), Diverticulosis, Fibroid, HSV infection, Hyperlipidemia, Hypothyroidism, IBS (irritable bowel syndrome), PAC (premature atrial contraction) (09/22/2016), PVC (premature ventricular contraction) (02/24/2023), and Vertigo.   Past Surgical History: She  has a past surgical history that includes Septoplasty; Cholecystectomy; colon polypectomy (2001); Cataract extraction; Abdominal hysterectomy; Repair fascial defect leg (1972); Anterior and posterior repair (N/A, 01/30/2013); Colonoscopy (12/25/2013); Thigh fasciotomy; Eye surgery; arthoscopy (Left, 08/2018); Knee surgery (Left, 2020); Cystocele repair (N/A, 03/27/2024); and Cystoscopy (N/A, 03/27/2024).   Medications: She has a current medication list which includes the following prescription(s): albuterol , ascorbic acid , budesonide -formoterol , elderberry, estradiol , famotidine , fluticasone , levothyroxine , montelukast , pravastatin , primidone , and  vitamin d  (cholecalciferol ).   Allergies: Patient is allergic to augmentin  [amoxicillin -pot clavulanate], cat dander, cat hair extract, grass pollen([id number]-swt vern), sulfonamide derivatives, tetracycline, ciprofloxacin , and dicyclomine .   Social History: Patient  reports that she has never smoked. She has never used smokeless tobacco. She reports current alcohol use of about 2.0 standard drinks of alcohol per week. She reports that she does not use drugs.     OBJECTIVE     Physical Exam: Vitals:   09/12/24 1540 09/12/24 1543  BP: (!) 147/84 120/76  Pulse: 77 76   Gen: No apparent distress, A&O x 3.  Detailed Urogynecologic Evaluation:  Normal external genitalia. On speculum, normal vaginal mucosa. On bimanual, no masses present.   POP-Q  -2                                            Aa   -2                                           Ba  -6                                              C   3  Gh  5.5                                            Pb  7                                            tvl   -3                                            Ap  -3                                            Bp                                                 D   Straight Catheterization Procedure for PVR: After verbal consent was obtained from the patient for catheterization to assess bladder emptying and residual volume the urethra and surrounding tissues were prepped with betadine and an in and out catheterization was performed.  PVR was 5mL.  Urine appeared clear yellow. The patient tolerated the procedure well.  Results for orders placed or performed in visit on 09/12/24  POCT URINALYSIS DIP (CLINITEK)   Collection Time: 09/12/24  4:29 PM  Result Value Ref Range   Color, UA yellow yellow   Clarity, UA clear clear   Glucose, UA negative negative mg/dL   Bilirubin, UA negative negative   Ketones, POC UA trace (5) (A) negative mg/dL    Spec Grav, UA 8.974 1.010 - 1.025   Blood, UA negative negative   pH, UA 6.0 5.0 - 8.0   POC PROTEIN,UA negative negative, trace   Urobilinogen, UA 0.2 0.2 or 1.0 E.U./dL   Nitrite, UA Negative Negative   Leukocytes, UA Trace (A) Negative      ASSESSMENT AND PLAN    Ms. Armstead is a 83 y.o. with:  1. Overactive bladder   2. Urinary frequency   3. Prolapse of anterior vaginal wall     Overactive bladder Assessment & Plan: We discussed the symptoms of overactive bladder (OAB), which include urinary urgency, urinary frequency, nocturia, with or without urge incontinence.  While we do not know the exact etiology of OAB, several treatment options exist. We discussed management including behavioral therapy (decreasing bladder irritants, urge suppression strategies, timed voids, bladder retraining), physical therapy, medication; for refractory cases posterior tibial nerve stimulation, sacral neuromodulation, and intravesical botulinum toxin injection.  - She prefers not to start a medication. List of bladder irritants provided. We discussed bladder retraining and handout provided.  - She is interested in PTNS but wants to wait until Feb once she returns from a trip.     Urinary frequency -     POCT URINALYSIS DIP (CLINITEK)  Prolapse of anterior vaginal wall Assessment & Plan: Stage 1 anterior prolapse, not significantly changed from a few months ago.  - She is  most bothered by her bladder urgency symptoms. We discussed that a pessary or repeat surgery will not change these symptoms.    Return for PTNS   Melissa LOISE Caper, MD

## 2024-09-12 NOTE — Assessment & Plan Note (Signed)
 We discussed the symptoms of overactive bladder (OAB), which include urinary urgency, urinary frequency, nocturia, with or without urge incontinence.  While we do not know the exact etiology of OAB, several treatment options exist. We discussed management including behavioral therapy (decreasing bladder irritants, urge suppression strategies, timed voids, bladder retraining), physical therapy, medication; for refractory cases posterior tibial nerve stimulation, sacral neuromodulation, and intravesical botulinum toxin injection.  - She prefers not to start a medication. List of bladder irritants provided. We discussed bladder retraining and handout provided.  - She is interested in PTNS but wants to wait until Feb once she returns from a trip.

## 2024-10-15 ENCOUNTER — Emergency Department (HOSPITAL_BASED_OUTPATIENT_CLINIC_OR_DEPARTMENT_OTHER)
Admission: EM | Admit: 2024-10-15 | Discharge: 2024-10-15 | Disposition: A | Discharge status: Home / Self Care | Attending: Emergency Medicine | Admitting: Emergency Medicine

## 2024-10-15 ENCOUNTER — Emergency Department (HOSPITAL_BASED_OUTPATIENT_CLINIC_OR_DEPARTMENT_OTHER)

## 2024-10-15 ENCOUNTER — Encounter (HOSPITAL_BASED_OUTPATIENT_CLINIC_OR_DEPARTMENT_OTHER): Payer: Self-pay

## 2024-10-15 ENCOUNTER — Other Ambulatory Visit: Payer: Self-pay

## 2024-10-15 DIAGNOSIS — K5732 Diverticulitis of large intestine without perforation or abscess without bleeding: Secondary | ICD-10-CM | POA: Insufficient documentation

## 2024-10-15 DIAGNOSIS — J45909 Unspecified asthma, uncomplicated: Secondary | ICD-10-CM | POA: Insufficient documentation

## 2024-10-15 DIAGNOSIS — R109 Unspecified abdominal pain: Secondary | ICD-10-CM

## 2024-10-15 DIAGNOSIS — E039 Hypothyroidism, unspecified: Secondary | ICD-10-CM | POA: Diagnosis not present

## 2024-10-15 DIAGNOSIS — K5792 Diverticulitis of intestine, part unspecified, without perforation or abscess without bleeding: Secondary | ICD-10-CM

## 2024-10-15 DIAGNOSIS — D72829 Elevated white blood cell count, unspecified: Secondary | ICD-10-CM | POA: Diagnosis not present

## 2024-10-15 LAB — URINALYSIS, ROUTINE W REFLEX MICROSCOPIC
Bilirubin Urine: NEGATIVE
Glucose, UA: NEGATIVE mg/dL
Ketones, ur: 15 mg/dL — AB
Leukocytes,Ua: NEGATIVE
Nitrite: NEGATIVE
Protein, ur: NEGATIVE mg/dL
Specific Gravity, Urine: 1.01 (ref 1.005–1.030)
pH: 5.5 (ref 5.0–8.0)

## 2024-10-15 LAB — CBC
HCT: 38.9 % (ref 36.0–46.0)
Hemoglobin: 13.1 g/dL (ref 12.0–15.0)
MCH: 29.9 pg (ref 26.0–34.0)
MCHC: 33.7 g/dL (ref 30.0–36.0)
MCV: 88.8 fL (ref 80.0–100.0)
Platelets: 242 K/uL (ref 150–400)
RBC: 4.38 MIL/uL (ref 3.87–5.11)
RDW: 13.3 % (ref 11.5–15.5)
WBC: 11.2 K/uL — ABNORMAL HIGH (ref 4.0–10.5)
nRBC: 0 % (ref 0.0–0.2)

## 2024-10-15 LAB — COMPREHENSIVE METABOLIC PANEL WITH GFR
ALT: 14 U/L (ref 0–44)
AST: 18 U/L (ref 15–41)
Albumin: 4.4 g/dL (ref 3.5–5.0)
Alkaline Phosphatase: 64 U/L (ref 38–126)
Anion gap: 13 (ref 5–15)
BUN: 15 mg/dL (ref 8–23)
CO2: 23 mmol/L (ref 22–32)
Calcium: 9.4 mg/dL (ref 8.9–10.3)
Chloride: 101 mmol/L (ref 98–111)
Creatinine, Ser: 0.85 mg/dL (ref 0.44–1.00)
GFR, Estimated: >60 mL/min
Glucose, Bld: 100 mg/dL — ABNORMAL HIGH (ref 70–99)
Potassium: 4.1 mmol/L (ref 3.5–5.1)
Sodium: 137 mmol/L (ref 135–145)
Total Bilirubin: 0.4 mg/dL (ref 0.0–1.2)
Total Protein: 6.8 g/dL (ref 6.5–8.1)

## 2024-10-15 LAB — URINALYSIS, MICROSCOPIC (REFLEX)

## 2024-10-15 LAB — LIPASE, BLOOD: Lipase: 16 U/L (ref 11–51)

## 2024-10-15 MED ORDER — METRONIDAZOLE 500 MG PO TABS: 500.0000 mg | ORAL_TABLET | Freq: Two times a day (BID) | ORAL | 0 refills | Status: AC

## 2024-10-15 MED ORDER — OXYCODONE HCL 5 MG PO TABS: 5.0000 mg | ORAL_TABLET | ORAL | 0 refills | Status: DC | PRN

## 2024-10-15 MED ORDER — CIPROFLOXACIN HCL 500 MG PO TABS: 500.0000 mg | ORAL_TABLET | Freq: Two times a day (BID) | ORAL | 0 refills | Status: DC

## 2024-10-15 MED ORDER — MORPHINE SULFATE (PF) 4 MG/ML IV SOLN: 4.0000 mg | Freq: Once | INTRAVENOUS | Status: DC

## 2024-10-15 MED ORDER — OXYCODONE HCL 5 MG PO TABS: 5.0000 mg | ORAL_TABLET | ORAL | 0 refills | Status: AC | PRN

## 2024-10-15 MED ORDER — KETOROLAC TROMETHAMINE 15 MG/ML IJ SOLN
15.0000 mg | Freq: Once | INTRAMUSCULAR | Status: AC
  Administered 2024-10-15: 15 mg via INTRAVENOUS
  Filled 2024-10-15: qty 1

## 2024-10-15 MED ORDER — METRONIDAZOLE 500 MG PO TABS: 500.0000 mg | ORAL_TABLET | Freq: Two times a day (BID) | ORAL | 0 refills | Status: DC

## 2024-10-15 MED ORDER — SODIUM CHLORIDE 0.9 % IV BOLUS: 1000.0000 mL | Freq: Once | INTRAVENOUS | Status: DC

## 2024-10-15 MED ORDER — ONDANSETRON HCL 4 MG/2ML IJ SOLN: 4.0000 mg | Freq: Once | INTRAMUSCULAR | Status: DC

## 2024-10-15 MED ORDER — IOHEXOL 300 MG/ML  SOLN
80.0000 mL | Freq: Once | INTRAMUSCULAR | Status: AC | PRN
  Administered 2024-10-15: 80 mL via INTRAVENOUS

## 2024-10-15 MED ORDER — CIPROFLOXACIN HCL 500 MG PO TABS: 500.0000 mg | ORAL_TABLET | Freq: Two times a day (BID) | ORAL | 0 refills | Status: AC

## 2024-10-15 NOTE — ED Notes (Signed)
 Patient transferred from waiting room to ED treatment room. Assuming pt care at this time.

## 2024-10-15 NOTE — ED Notes (Signed)
 Patient ambulated steadily to/from restroom with 1 person assist

## 2024-10-15 NOTE — ED Provider Notes (Signed)
 " [place] [facility] Provider Note  CSN: [id number] Arrival date & time: 10/15/24 1553  Chief Complaint(s) Abdominal Pain  HPI Melissa Lowe is a 83 y.o. female with past medical history as below, significant for asthma, diverticulosis, diverticulitis, IBS who presents to the ED with complaint of abdominal pain  Patient history of diverticulitis, last episode was back in June.  She began having left lower quad abdominal pain this morning.  Sharp and stabbing.  No blood in her stool, no fevers, no vomiting.  Has not take any medications today for the symptoms.  She follows with GI in the office.  No chest pain, dyspnea, nausea, rashes, fevers.  Past Medical History Past Medical History:  Diagnosis Date   Allergy    Aortic atherosclerosis 12/30/2023   Asthma    Chest pain of uncertain etiology 02/24/2023   Diverticulosis    Fibroid    HSV infection    nose   Hyperlipidemia    Hypothyroidism    IBS (irritable bowel syndrome)    PAC (premature atrial contraction) 09/22/2016   PVC (premature ventricular contraction) 02/24/2023   Vertigo    Patient Active Problem List   Diagnosis Date Noted   Overactive bladder 09/12/2024   Hiatal hernia 06/23/2024   UTI (urinary tract infection) 06/22/2024   Bronchiectasis (HCC) 06/22/2024   Rash and nonspecific skin eruption 06/16/2024   Achilles tendinitis, left leg 05/18/2024   Sigmoid diverticulitis 05/03/2024   LLQ pain 04/24/2024   Allergy    Allergic rhinitis 02/22/2024   Dizziness 02/04/2024   Aortic atherosclerosis 12/30/2023   Decreased strength 12/02/2023   Impaired mobility and activities of daily living 12/02/2023   Stiffness of joint, lower leg 12/02/2023   Grade I diastolic dysfunction 11/05/2023   Chronic rhinitis 09/12/2023   Sensorineural hearing loss, 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   Mild mitral regurgitation 08/17/2023   Mild aortic regurgitation 08/17/2023   PVC (premature  ventricular contraction) 02/24/2023   Scoliosis 02/11/2023   Osteoarthritis of carpometacarpal (CMC) joint of thumb 06/08/2022   Pain in right hand 06/08/2022   Tinea unguium 06/08/2022   Trigger thumb of right hand 06/08/2022   Chronic kidney disease, stage 3a (HCC) 08/03/2021   Low back pain 08/16/2018   Lumbosacral spondylosis without myelopathy 08/16/2018   Hypothyroidism 01/12/2017   GERD (gastroesophageal reflux disease) 01/12/2017   PAC (premature atrial contraction) 09/22/2016   Fatty liver, mild 05/19/2016   Prediabetes 05/14/2016   Benign essential tremor 05/14/2016   Prolapse of anterior vaginal wall 05/14/2016   LIPOMA 03/10/2010   Tinnitus of both ears 03/10/2010   DIVERTICULOSIS, COLON 10/18/2008   Hypercholesteremia 08/08/2007   HIP PAIN 08/08/2007   Asthma 08/02/2007   History of colonic polyps 08/02/2007   Home Medication(s) Prior to Admission medications  Medication Sig Start Date End Date Taking? Authorizing Provider  albuterol  (VENTOLIN  HFA) 108 (90 Base) MCG/ACT inhaler USE 1 INHALATION BY MOUTH EVERY  6 HOURS AS NEEDED FOR WHEEZING  OR SHORTNESS OF BREATH 11/03/23   Geofm Glade PARAS, MD  ascorbic acid  (VITAMIN C ) 500 MG tablet Take 500 mg by mouth daily.    [provider]  budesonide -formoterol  (SYMBICORT ) 80-4.5 MCG/ACT inhaler Inhale 2 puffs into the lungs 2 (two) times daily. Patient taking differently: Inhale 1 puff into the lungs daily. 03/11/24   Geofm Glade PARAS, MD  ELDERBERRY PO Take 2 tablets by mouth daily.    [provider]  estradiol  (ESTRACE ) 0.1 MG/GM vaginal cream Place 0.5g  twice a week 05/11/24   [name], MD  famotidine  (PEPCID ) 20 MG tablet Take 40 mg by mouth daily.     [provider]  fluticasone  (FLONASE ) 50 MCG/ACT nasal spray Place 1 spray into both nostrils daily. 1-2 sprays in the left nostril daily    [provider]  levothyroxine  (SYNTHROID ) 50 MCG tablet TAKE 1 TABLET BY MOUTH IN THE   MORNING 05/31/24   Geofm Glade PARAS, MD  montelukast  (SINGULAIR ) 10 MG tablet TAKE 1 TABLET BY MOUTH DAILY 08/24/24   Geofm Glade PARAS, MD  pravastatin  (PRAVACHOL ) 40 MG tablet TAKE 1 TABLET BY MOUTH AT  BEDTIME 08/24/24   Geofm Glade PARAS, MD  primidone  (MYSOLINE ) 50 MG tablet Take 1/2 tablet at night 04/18/24   Tat, Rebecca S, DO  Vitamin D , Cholecalciferol , 25 MCG (1000 UT) CAPS Take 1,000 Units by mouth daily.    [provider]                                                                                                                                    Past Surgical History Past Surgical History:  Procedure Laterality Date   ABDOMINAL HYSTERECTOMY     2  fibroids   ANTERIOR AND POSTERIOR REPAIR N/A 01/30/2013   Posterior repair ONLY, Surgeon: Percilla Burly, MD;  Location: [facility];  Service: Gynecology;  Laterality: N/A;   arthoscopy Left 08/2018   L knee repair torn meniscus   CATARACT EXTRACTION     CHOLECYSTECTOMY     colon polypectomy  2001   Dr Luis   COLONOSCOPY  12/25/2013   Tics , Dr Luis remak 2020   CYSTOCELE REPAIR N/A 03/27/2024   Procedure: COLPORRHAPHY, ANTERIOR, FOR CYSTOCELE REPAIR;  Surgeon: [name], MD;  Location: [facility] OR;  Service: Gynecology;  Laterality: N/A;  Total time needed is 1 hour   CYSTOSCOPY N/A 03/27/2024   Procedure: CYSTOSCOPY;  Surgeon: [name], MD;  Location: [facility] OR;  Service: Gynecology;  Laterality: N/A;   EYE SURGERY     KNEE SURGERY Left 2020   MENISCUS REPAIR   REPAIR FASCIAL DEFECT LEG  1972   SEPTOPLASTY     THIGH FASCIOTOMY     Family History Family History  Problem Relation Age of Onset   Dementia Mother    Cancer Father 36       throat CA; pneumonia   Asthma Sister    Breast cancer Sister    Arrhythmia Sister        pacemaker for bradycardia   Stroke Maternal Grandmother        in 49s   Cancer Maternal Grandfather    Stroke Paternal Grandmother 40   Bladder Cancer Neg Hx    Renal cancer  Neg Hx    Uterine cancer Neg Hx     Social History Social History[1] Allergies Augmentin  [amoxicillin -pot clavulanate], Cat dander, Cat hair extract, Grass  pollen([id number]-swt vern), Sulfonamide derivatives, Tetracycline, and Dicyclomine   Review of Systems A thorough review of systems was obtained and all systems are negative except as noted in the HPI and PMH.   Physical Exam Vital Signs  I have reviewed the triage vital signs BP (!) 156/72 (BP Location: Left Arm)   Pulse 80   Temp 98.7 F (37.1 C) (Oral)   Resp 16   Ht 5' 5 (1.651 m)   Wt 84 kg   SpO2 100%   BMI 30.82 kg/m  Physical Exam Vitals and nursing note reviewed.  Constitutional:      General: She is not in acute distress.    Appearance: Normal appearance. She is well-developed. She is not ill-appearing.  HENT:     Head: Normocephalic and atraumatic.     Right Ear: External ear normal.     Left Ear: External ear normal.     Nose: Nose normal.     Mouth/Throat:     Mouth: Mucous membranes are moist.  Eyes:     General: No scleral icterus.       Right eye: No discharge.        Left eye: No discharge.  Cardiovascular:     Rate and Rhythm: Normal rate.  Pulmonary:     Effort: Pulmonary effort is normal. No respiratory distress.     Breath sounds: No stridor.  Abdominal:     General: Abdomen is flat. There is no distension.     Palpations: Abdomen is soft.     Tenderness: There is abdominal tenderness in the left lower quadrant. There is no guarding. Negative signs include Murphy's sign.  Musculoskeletal:        General: No deformity.     Cervical back: No rigidity.  Skin:    General: Skin is warm and dry.     Coloration: Skin is not cyanotic, jaundiced or pale.  Neurological:     Mental Status: She is alert.  Psychiatric:        Speech: Speech normal.        Behavior: Behavior normal. Behavior is cooperative.     ED Results and Treatments Labs (all labs ordered are listed, but only abnormal  results are displayed) Labs Reviewed  COMPREHENSIVE METABOLIC PANEL WITH GFR - Abnormal; Notable for the following components:      Result Value   Glucose, Bld 100 (*)    All other components within normal limits  CBC - Abnormal; Notable for the following components:   WBC 11.2 (*)    All other components within normal limits  LIPASE, BLOOD  URINALYSIS, ROUTINE W REFLEX MICROSCOPIC                                                                                                                          Radiology CT ABDOMEN PELVIS W CONTRAST Result Date: 10/15/2024 EXAM: CT ABDOMEN AND PELVIS WITH CONTRAST 10/15/2024 05:07:59 PM TECHNIQUE: CT of the abdomen and pelvis was performed  with the administration of 80 mL of iohexol  (OMNIPAQUE ) 300 MG/ML solution. Multiplanar reformatted images are provided for review. Automated exposure control, iterative reconstruction, and/or weight-based adjustment of the mA/kV was utilized to reduce the radiation dose to as low as reasonably achievable. COMPARISON: 05/10/2024 CLINICAL HISTORY: LLQ abdominal pain; feels like prior diverticulitis. FINDINGS: LOWER CHEST: Fibrinoneal scarring in the lung bases. LIVER: The most superior aspect of the right hepatic dome is excluded from the field of view. Otherwise, no visualized mass. GALLBLADDER AND BILE DUCTS: Cholecystectomy. No biliary ductal dilatation. SPLEEN: No acute abnormality. PANCREAS: No acute abnormality. ADRENAL GLANDS: No acute abnormality. KIDNEYS, URETERS AND BLADDER: No stones in the kidneys or ureters. No hydronephrosis. No perinephric or periureteral stranding. Urinary bladder is unremarkable. GI AND BOWEL: Small hiatal hernia. Decompressed stomach. 2.5 cm duodenal diverticulum arising from the 4th portion. Normal appendix. Descending and sigmoid colonic diverticulosis. Moderate inflammatory stranding surrounding the distal defscending colon. There is no bowel obstruction. PERITONEUM AND RETROPERITONEUM: No  ascites. No free air. VASCULATURE: Diffuse aortoiliac atherosclerosis. LYMPH NODES: No lymphadenopathy. REPRODUCTIVE ORGANS: Hysterectomy. BONES AND SOFT TISSUES: Multilevel degenerative disc disease of the thoracolumbar spine. Diffuse osteopenia. No acute osseous abnormality. No focal soft tissue abnormality. IMPRESSION: 1. Acute diverticulitis in the left lower quadrant along the distal descending colon. No peridiverticular abscess or pneumoperitoneum. Electronically signed by: Rogelia Myers MD 10/15/2024 05:22 PM EST RP Workstation: [id number]    Pertinent labs & imaging results that were available during my care of the patient were reviewed by me and considered in my medical decision making (see MDM for details).  Medications Ordered in ED Medications  ketorolac  (TORADOL ) 15 MG/ML injection 15 mg (has no administration in time range)  iohexol  (OMNIPAQUE ) 300 MG/ML solution 80 mL (80 mLs Intravenous Contrast Given 10/15/24 1701)                                                                                                                                     Procedures Procedures  (including critical care time)  Medical Decision Making / ED Course    Medical Decision Making:    ZULMA COURT is a 83 y.o. female with past medical history as below, significant for asthma, diverticulosis, diverticulitis, IBS who presents to the ED with complaint of abdominal pain. The complaint involves an extensive differential diagnosis and also carries with it a high risk of complications and morbidity.  Serious etiology was considered. Ddx includes but is not limited to: Differential diagnosis includes but is not exclusive to ectopic pregnancy, ovarian cyst, ovarian torsion, acute appendicitis, urinary tract infection, endometriosis, bowel obstruction, hernia, colitis, renal colic, gastroenteritis, volvulus etc.   Complete initial physical exam performed, notably the patient was in no acute  distress, resting comfortably.    Reviewed and confirmed nursing documentation for past medical history, family history, social history.  Vital signs reviewed.    Left lower quad abdominal pain Uncomplicated diverticulitis> -  Left lower quadrant abdominal pain, no BRBPR or melena.  No vomiting, no fevers - Mild leukocytosis 11.2, no fever, not septic. - CT abdomen pelvis with uncomplicated diverticulitis - Provided Toradol  here, symptoms improved - She is well-appearing, tolerating p.o., pain well-controlled, not septic, no evidence of complicated diverticulitis.  Recommend outpatient management, will provide antibiotics, analgesia for home, dietary striction's, follow-up with GI  6:04 PM:  I have discussed the diagnosis/risks/treatment options with the patient and family.  Evaluation and diagnostic testing in the emergency department does not suggest an emergent condition requiring admission or immediate intervention beyond what has been performed at this time.  They will follow up with pcp/gi. We also discussed returning to the ED immediately if new or worsening sx occur. We discussed the sx which are most concerning (e.g., sudden worsening pain, fever, inability to tolerate by mouth) that necessitate immediate return.    The patient appears reasonably screened and/or stabilized for discharge and I doubt any other medical condition or other [facility] requiring further screening, evaluation, or treatment in the ED at this time prior to discharge.                        Additional history obtained: -Additional history obtained from family -External records from outside source obtained and reviewed including: Chart review including previous notes, labs, imaging, consultation notes including  Recent urgent care documentation, prior imaging, labs   Lab Tests: -I ordered, reviewed, and interpreted labs.   The pertinent results include:   Labs Reviewed  COMPREHENSIVE METABOLIC  PANEL WITH GFR - Abnormal; Notable for the following components:      Result Value   Glucose, Bld 100 (*)    All other components within normal limits  CBC - Abnormal; Notable for the following components:   WBC 11.2 (*)    All other components within normal limits  LIPASE, BLOOD  URINALYSIS, ROUTINE W REFLEX MICROSCOPIC    Notable for as above  EKG   EKG Interpretation Date/Time:    Ventricular Rate:    PR Interval:    QRS Duration:    QT Interval:    QTC Calculation:   R Axis:      Text Interpretation:           Imaging Studies ordered: I ordered imaging studies including CTAP I independently visualized the following imaging with scope of interpretation limited to determining acute life threatening conditions related to emergency care; findings noted above I agree with the radiologist interpretation If any imaging was obtained with contrast I closely monitored patient for any possible adverse reaction a/w contrast administration in the emergency department   Medicines ordered and prescription drug management: Meds ordered this encounter  Medications   iohexol  (OMNIPAQUE ) 300 MG/ML solution 80 mL   DISCONTD: morphine  (PF) 4 MG/ML injection 4 mg   DISCONTD: ondansetron  (ZOFRAN ) injection 4 mg   DISCONTD: sodium chloride  0.9 % bolus 1,000 mL   ketorolac  (TORADOL ) 15 MG/ML injection 15 mg    -I have reviewed the patients home medicines and have made adjustments as needed   Consultations Obtained: na   Cardiac Monitoring: Continuous pulse oximetry interpreted by myself, 100% on RA.    Social Determinants of Health:  Diagnosis or treatment significantly limited by social determinants of health: obesity   Reevaluation: After the interventions noted above, I reevaluated the patient and found that they have improved  Co morbidities that complicate the patient evaluation  Past Medical History:  Diagnosis Date   Allergy    Aortic atherosclerosis 12/30/2023    Asthma    Chest pain of uncertain etiology 02/24/2023   Diverticulosis    Fibroid    HSV infection    nose   Hyperlipidemia    Hypothyroidism    IBS (irritable bowel syndrome)    PAC (premature atrial contraction) 09/22/2016   PVC (premature ventricular contraction) 02/24/2023   Vertigo       Dispostion: Disposition decision including need for hospitalization was considered, and patient discharged from emergency department.    Final Clinical Impression(s) / ED Diagnoses Final diagnoses:  Abdominal pain, unspecified abdominal location  Diverticulitis         [1]  Social History Tobacco Use   Smoking status: Never   Smokeless tobacco: Never  Vaping Use   Vaping status: Never Used  Substance Use Topics   Alcohol use: Yes    Alcohol/week: 2.0 standard drinks of alcohol    Types: 2 Glasses of wine per week    Comment: once a week   Drug use: No     Elnor Jayson LABOR, DO 10/15/24 1804  "

## 2024-10-15 NOTE — ED Notes (Signed)
 Patient aware of necessity of urine sample.

## 2024-10-15 NOTE — ED Triage Notes (Signed)
 Sent by [facility]  LLQ abd pain, diarrhea since this morning   Hx of diverticulitis

## 2024-10-15 NOTE — Discharge Instructions (Addendum)
 It was a pleasure caring for you today in the emergency department.  Your abdominal pain today is likely secondary to diverticulitis.  Recommend you follow a liquid diet and eat low fiber foods over the next week, bowel rest will improve your symptoms.  I have prescribed you medication for pain and also antibiotics that can help reduce the duration of your symptoms.  Please follow-up with your gastroenterologist in the office.  Please return to the emergency department for any worsening or worrisome symptoms.

## 2024-10-18 ENCOUNTER — Other Ambulatory Visit: Payer: Self-pay | Admitting: Internal Medicine

## 2024-10-18 DIAGNOSIS — J45909 Unspecified asthma, uncomplicated: Secondary | ICD-10-CM

## 2024-10-27 ENCOUNTER — Encounter: Payer: Self-pay | Admitting: Internal Medicine

## 2024-10-27 ENCOUNTER — Ambulatory Visit: Payer: Medicare Other | Admitting: Neurology

## 2024-10-31 NOTE — Progress Notes (Signed)
 " [facility] Initial Consultation   Referring Provider Geofm Glade PARAS, MD [address] [place],  [place] [address]  Primary Care Provider Geofm, Glade PARAS, MD  Patient Profile: Melissa Lowe is a 84 y.o. female who is seen in consultation in the [facility] at the request of Dr. Geofm for evaluation and management of the problem(s) noted below.  Problem List: Recurrent uncomplicated diverticulitis 2009, 04/2024, 09/2024 Left lower quadrant abdominal pain Change in bowel habits History of adenomatous colon polyp GERD  History of Present Illness     Discussed the use of AI scribe software for clinical note transcription with the patient, who gave verbal consent to proceed.  History of Present Illness Melissa Lowe is an 84 year old female with a past medical history noteworthy for asthma, essential tremor, vertigo, dyslipidemia, hypothyroidism who is referred to the gastroenterology office for evaluation of recurrent uncomplicated diverticulitis and abdominal pain  Recurrent uncomplicated diverticulitis - Reports a remote history of diverticulitis in 2009  - Onset of left lower quadrant abdominal pain 03/2024 with CTAP c/w uncomplicated sigmoid diverticulitis - Initially treated with Augmentin  without benefit - Subsequently hospitalized 04/2024 and treated with ciprofloxacin  and metronidazole ; also diagnosed with UTI at that time  - Developed recurrent symptoms of left lower quadrant abdominal pain 09/2024 prompting ER evaluation - CTAP once again confirmed uncomplicated sigmoid diverticulitis -started on ciprofloxacin  and metronidazole  again - Also diagnosed with concomitant UTI similar to prior episode in the summer - Completed antibiotics 2 weeks ago and feels that acute symptoms have improved  Left lower quadrant abdominal pain - Persistent focal pain in the sigmoid region since first diverticulitis episode in summer 2025 - Pain described as  pingy - Most noticeable on waking in the morning, lessens throughout the day - Focally tender in the left lower quadrant on physical examination today  Change in bowel habits - During diverticulitis flares: intermittent explosive diarrhea and alternating regular bowel movements and diarrhea - Currently takes Colace 2 capsules daily because she is concerned that constipation provoked most recent episode of diverticulitis - Stools are softer and formed, usually 1 to 2 bowel movements per day - Stool caliber generally half an inch to an inch in diameter - History of pencil-thin stools, but not recently - No current diarrhea  Colonic polyps and colonoscopy history - History of colonic polyps removed on prior colonoscopies -Dr. Marshell prior reports suggest that she had a remote history of adenomatous colon polyps - Believes last colonoscopy was in June 2020, though most recent available record is from 2015 - Colonoscopies in 2015 and 2009 showed diverticulosis but no colon polyps - No family history of colorectal cancer  GERD - Has a history of GERD that is currently well-controlled on Pepcid  40 mg orally daily  GI Review of Symptoms Significant for change in bowel habits and left lower quadrant abdominal pain otherwise negative.  General Review of Systems  Review of systems is significant for the pertinent positives and negatives as listed per the HPI.  Full ROS is otherwise negative.  Past Medical History   Past Medical History:  Diagnosis Date   Allergy    Aortic atherosclerosis 12/30/2023   Asthma    Chest pain of uncertain etiology 02/24/2023   Diverticulosis    Fibroid    HSV infection    nose   Hyperlipidemia    Hypothyroidism    IBS (irritable bowel syndrome)    PAC (premature atrial contraction) 09/22/2016   PVC (premature ventricular contraction) 02/24/2023  Vertigo      Past Surgical History   Past Surgical History:  Procedure Laterality Date   ABDOMINAL  HYSTERECTOMY     2  fibroids   ANTERIOR AND POSTERIOR REPAIR N/A 01/30/2013   Posterior repair ONLY, Surgeon: Percilla Burly, MD;  Location: [facility];  Service: Gynecology;  Laterality: N/A;   arthoscopy Left 08/2018   L knee repair torn meniscus   CATARACT EXTRACTION     CHOLECYSTECTOMY     colon polypectomy  2001   Dr Luis   COLONOSCOPY  12/25/2013   Tics , Dr Luis remak 2020   CYSTOCELE REPAIR N/A 03/27/2024   Procedure: COLPORRHAPHY, ANTERIOR, FOR CYSTOCELE REPAIR;  Surgeon: [name], MD;  Location: [facility] OR;  Service: Gynecology;  Laterality: N/A;  Total time needed is 1 hour   CYSTOSCOPY N/A 03/27/2024   Procedure: CYSTOSCOPY;  Surgeon: [name], MD;  Location: [facility] OR;  Service: Gynecology;  Laterality: N/A;   EYE SURGERY     KNEE SURGERY Left 2020   MENISCUS REPAIR   REPAIR FASCIAL DEFECT LEG  1972   SEPTOPLASTY     THIGH FASCIOTOMY       Allergies and Medications   Allergies[1]    Current Outpatient Medications  Medication Instructions   albuterol  (VENTOLIN  HFA) 108 (90 Base) MCG/ACT inhaler USE 1 INHALATION BY MOUTH EVERY  6 HOURS AS NEEDED FOR WHEEZING  OR SHORTNESS OF BREATH   ascorbic acid  (VITAMIN C ) 500 mg, Daily   ciprofloxacin  (CIPRO ) 500 mg, Oral, 2 times daily   docusate (COLACE) 50 MG/5ML liquid Daily   ELDERBERRY PO 2 tablets, Daily   estradiol  (ESTRACE ) 0.1 MG/GM vaginal cream Place 0.5g twice a week   famotidine  (PEPCID ) 40 mg, Daily   fluticasone  (FLONASE ) 50 MCG/ACT nasal spray 1 spray, Daily   levothyroxine  (SYNTHROID ) 50 mcg, Oral, Every morning   metroNIDAZOLE  (FLAGYL ) 500 mg, Oral, 2 times daily   montelukast  (SINGULAIR ) 10 mg, Oral, Daily   oxyCODONE  (ROXICODONE ) 5 mg, Oral, Every 4 hours PRN   pravastatin  (PRAVACHOL ) 40 mg, Oral, Daily at bedtime   primidone  (MYSOLINE ) 50 MG tablet Take 1/2 tablet at night   SYMBICORT  80-4.5 MCG/ACT inhaler USE 1 INHALATION BY MOUTH DAILY  PER THE MANUFACTURER, EXPIRES 3  MONTHS AFTER  OPENING OVERWRAP   Vitamin D  (Cholecalciferol ) 1,000 Units, Daily     Family History   Family History  Problem Relation Age of Onset   Dementia Mother    Esophageal cancer Father    Asthma Sister    Breast cancer Sister    Arrhythmia Sister        pacemaker for bradycardia   Stroke Maternal Grandmother        in 36s   Cancer Maternal Grandfather    Stroke Paternal Grandmother 40   Bladder Cancer Neg Hx    Renal cancer Neg Hx    Uterine cancer Neg Hx      Social History   Social History[2] Ryla reports that she has never smoked. She has never used smokeless tobacco. She reports current alcohol use of about 2.0 standard drinks of alcohol per week. She reports that she does not use drugs.  Vital Signs and Physical Examination   Vitals:   11/02/24 0906  BP: 120/66  Pulse: 78   Body mass index is 29.35 kg/m. Weight: 176 lb 6 oz (80 kg)  General: Well developed, well nourished, no acute distress Head: Normocephalic and atraumatic Eyes: Sclerae anicteric, EOMI Lungs: Clear  throughout to auscultation Heart: Regular rate and rhythm; No murmurs, rubs or bruits Abdomen: Soft, focally tender in left lower quadrant and non distended. No masses, hepatosplenomegaly or hernias noted. Normal Bowel sounds Rectal: Deferred Musculoskeletal: Symmetrical with no gross deformities -ambulates slowly with antalgic gait  Review of Data  The following data was reviewed at the time of this encounter:  Laboratory Studies      Latest Ref Rng & Units 10/15/2024    4:05 PM 08/28/2024    2:45 PM 06/16/2024   12:14 PM  CBC  WBC 4.0 - 10.5 K/uL 11.2  5.4  5.3   Hemoglobin 12.0 - 15.0 g/dL 86.8  87.2  86.5   Hematocrit 36.0 - 46.0 % 38.9  37.2  41.2   Platelets 150 - 400 K/uL 242  260.0  236     Lab Results  Component Value Date   LIPASE 16 10/15/2024      Latest Ref Rng & Units 10/15/2024    4:05 PM 08/28/2024    2:45 PM 06/16/2024   12:14 PM  CMP  Glucose 70 - 99 mg/dL 899   896  94   BUN 8 - 23 mg/dL 15  12  13    Creatinine 0.44 - 1.00 mg/dL 9.14  9.16  9.05   Sodium 135 - 145 mmol/L 137  136  135   Potassium 3.5 - 5.1 mmol/L 4.1  3.9  4.1   Chloride 98 - 111 mmol/L 101  101  99   CO2 22 - 32 mmol/L 23  28  22    Calcium 8.9 - 10.3 mg/dL 9.4  9.1  9.6   Total Protein 6.5 - 8.1 g/dL 6.8  6.3  7.6   Total Bilirubin 0.0 - 1.2 mg/dL 0.4  0.4  1.3   Alkaline Phos 38 - 126 U/L 64  51  55   AST 15 - 41 U/L 18  13  22    ALT 0 - 44 U/L 14  12  15       Imaging Studies  CTAP 10/15/2024 1. Acute diverticulitis in the left lower quadrant along the distal descending colon. No peridiverticular abscess or pneumoperitoneum  CTAP 05/10/2024 Changes consistent with acute uncomplicated sigmoid diverticulitis. This has progressed slightly in the interval from the prior exam. No perforation is noted.  CTAP 04/24/2024 1. Acute sigmoid diverticulitis. No diverticular abscess or perforation. 2.  Aortic Atherosclerosis (ICD10-I70.0).  GI Procedures and Studies  Colonoscopy 12/2013 - Moderate sigmoid diverticulosis - No polyps  Colonoscopy 06/2008 - Left colon diverticulosis - No polyps   Clinical Impression  It is my clinical impression that Ms. Beckum is a 84 y.o. female with;  Recurrent uncomplicated diverticulitis 2009, 04/2024, 09/2024 Left lower quadrant abdominal pain Change in bowel habits History of adenomatous colon polyp GERD  Ms. Patalano presents to the office today for evaluation of recurrent uncomplicated diverticulitis and left lower quadrant abdominal pain.  She reports a remote history of diverticulitis in 2009.  Within the last 6 months she has had 2 episodes of uncomplicated sigmoid diverticulitis 04/2024 and 09/2024.  She required hospitalization during her first episode for failure of treatment with Augmentin .  Her symptoms have typically responded to ciprofloxacin  and metronidazole .  She notes that she has an diagnosed with a concomitant UTI  associated with both episodes of diverticulitis.  CT imaging has not shown any concern for colovesical fistula.  She has generally recovered from these episodes, however, she does have focal left lower quadrant abdominal discomfort  and was tender on physical exam today.  Has also noted some change in bowel habits with respect to bowel frequency and stool caliber.  She believes her last colonoscopy was performed in 2020.  Colonoscopies in 2009 and 2015 were normal.  Reports a remote history of colon polyps.  We discussed that current guidelines would advise an updated colonoscopy in the setting of recent episodes of diverticulitis given that he has been more than 1 to 2 years since her last colonoscopy.  She is amenable to undergoing a procedure.  She does have upcoming knee replacement surgery scheduled within the next 2 weeks.  Due to this scheduled surgery she requests to have her colonoscopy performed once she has recovered from the surgery which is reasonable.  I offered to prescribe prescriptions for ciprofloxacin  and metronidazole  for her to have on hand should she develop recurrent symptoms.  Counseled her that if she feels an episode evolving and starts antibiotics she should notify our office.  Ms. Duley also has a history of GERD well-managed with Pepcid .  There are no alarm features.  No further evaluation or treatment changes warranted at this time.  Plan  Prescriptions provided for metronidazole  500 mg p.o. twice daily and ciprofloxacin  500 mg p.o. twice daily for her to have available should an episode of diverticulitis begin to evolve. Continue Colace. She will contact our office once she has recovered from her knee replacement surgery and we will schedule a colonoscopy in a hospital-based setting given her age and mobility issues Continue Pepcid  40 mg orally daily  Planned Follow Up TBD pending timing of next colonoscopy  The patient or caregiver verbalized understanding of the  material covered, with no barriers to understanding. All questions were answered. Patient or caregiver is agreeable with the plan outlined above.    It was a pleasure to see Abreanna.  If you have any questions or concerns regarding this evaluation, do not hesitate to contact me.  Inocente Hausen, MD [facility]   I spent total of 45 minutes in both face-to-face (25 minutes interview) and non-face-to-face (20 minutes chart review, care coordination, documentation)  activities, excluding procedures performed, for the visit on the date of this encounter.      [1]  Allergies Allergen Reactions   Augmentin  [Amoxicillin -Pot Clavulanate] Hives   Cat Dander Shortness Of Breath   Cat Hair Extract Shortness Of Breath   Grass Pollen(K-O-R-T-Swt Vern) Shortness Of Breath   Sulfonamide Derivatives     Other Reaction(s): Other    Rash on inside of arms Because of a history of documented adverse serious drug reaction;Medi Alert bracelet  is recommended    Rash on inside of arms Because of a history of documented adverse serious drug reaction;Medi Alert bracelet  is recommended   Tetracycline     Severe itching on lower leg   Dicyclomine  Hives  [2]  Social History Tobacco Use   Smoking status: Never   Smokeless tobacco: Never  Vaping Use   Vaping status: Never Used  Substance Use Topics   Alcohol use: Yes    Alcohol/week: 2.0 standard drinks of alcohol    Types: 2 Glasses of wine per week    Comment: once a week   Drug use: No   "

## 2024-11-02 ENCOUNTER — Encounter: Payer: Self-pay | Admitting: Pediatrics

## 2024-11-02 ENCOUNTER — Ambulatory Visit (INDEPENDENT_AMBULATORY_CARE_PROVIDER_SITE_OTHER): Admitting: Pediatrics

## 2024-11-02 ENCOUNTER — Encounter: Payer: Self-pay | Admitting: Internal Medicine

## 2024-11-02 VITALS — BP 120/66 | HR 78 | Ht 65.0 in | Wt 176.4 lb

## 2024-11-02 DIAGNOSIS — Z860100 Personal history of colon polyps, unspecified: Secondary | ICD-10-CM

## 2024-11-02 DIAGNOSIS — K219 Gastro-esophageal reflux disease without esophagitis: Secondary | ICD-10-CM

## 2024-11-02 DIAGNOSIS — R1032 Left lower quadrant pain: Secondary | ICD-10-CM | POA: Diagnosis not present

## 2024-11-02 DIAGNOSIS — K5732 Diverticulitis of large intestine without perforation or abscess without bleeding: Secondary | ICD-10-CM

## 2024-11-02 DIAGNOSIS — R194 Change in bowel habit: Secondary | ICD-10-CM | POA: Diagnosis not present

## 2024-11-02 DIAGNOSIS — Z860101 Personal history of adenomatous and serrated colon polyps: Secondary | ICD-10-CM

## 2024-11-02 DIAGNOSIS — Z01818 Encounter for other preprocedural examination: Secondary | ICD-10-CM | POA: Insufficient documentation

## 2024-11-02 MED ORDER — METRONIDAZOLE 500 MG PO TABS: 500.0000 mg | ORAL_TABLET | Freq: Two times a day (BID) | ORAL | 0 refills | Status: DC

## 2024-11-02 MED ORDER — CIPROFLOXACIN HCL 500 MG PO TABS: 500.0000 mg | ORAL_TABLET | Freq: Two times a day (BID) | ORAL | 0 refills | Status: DC

## 2024-11-02 NOTE — Progress Notes (Signed)
 "     Subjective:    Patient ID: Melissa Lowe, female    DOB: 09/08/1941, 84 y.o.   MRN: [id number]     HPI Robena is here for pre-operative clearance at the request of Dr Alm Going for Total knee replacement scheduled for 11/14/2024.   Julea denies any personal or family history of problems with anesthesia or bleeding/blood clot problems.    Tekeyah has no concerns and is taking all prescribed medication as prescribed.   Bryana is not exercising regularly due to her knee pain.  With their daily activities they denies chest pain, palpitations, SOB and lightheadedness.     Tweek of pain in LLQ every morning since diverticulitis  Medications and allergies reviewed with patient and updated if appropriate.  Medications Ordered Prior to Encounter[1]   Review of Systems  Constitutional:  Negative for fever.  Respiratory:  Negative for cough, shortness of breath and wheezing.   Cardiovascular:  Negative for chest pain, palpitations and leg swelling.  Gastrointestinal:  Positive for abdominal pain (LLQ - recovering form diverticulitis). Negative for blood in stool, constipation and diarrhea.  Genitourinary:  Negative for dysuria and hematuria.  Skin:  Negative for rash.  Neurological:  Positive for headaches (occ). Negative for light-headedness.       Objective:   Vitals:   11/03/24 1051  BP: 118/64  Pulse: 65  Temp: 98.1 F (36.7 C)  SpO2: 98%   BP Readings from Last 3 Encounters:  11/03/24 118/64  11/02/24 120/66  10/15/24 (!) 156/72   Wt Readings from Last 3 Encounters:  11/03/24 176 lb (79.8 kg)  11/02/24 176 lb 6 oz (80 kg)  10/15/24 185 lb 3 oz (84 kg)   Body mass index is 28.84 kg/m.    Physical Exam Constitutional:      General: She is not in acute distress.    Appearance: Normal appearance.  HENT:     Head: Normocephalic and atraumatic.     Mouth/Throat:     Mouth: Mucous membranes are moist.     Pharynx: No posterior oropharyngeal erythema.   Eyes:     Conjunctiva/sclera: Conjunctivae normal.  Cardiovascular:     Rate and Rhythm: Normal rate and regular rhythm.     Heart sounds: Normal heart sounds.  Pulmonary:     Effort: Pulmonary effort is normal. No respiratory distress.     Breath sounds: Normal breath sounds. No wheezing.  Abdominal:     General: There is no distension.     Palpations: Abdomen is soft.     Tenderness: There is abdominal tenderness (LLQ). There is no guarding or rebound.  Musculoskeletal:     Cervical back: Neck supple.     Right lower leg: No edema.     Left lower leg: No edema.  Lymphadenopathy:     Cervical: No cervical adenopathy.  Skin:    General: Skin is warm and dry.     Findings: No rash.  Neurological:     Mental Status: She is alert. Mental status is at baseline.  Psychiatric:        Mood and Affect: Mood normal.        Behavior: Behavior normal.        Lab Results  Component Value Date   WBC 11.2 (H) 10/15/2024   HGB 13.1 10/15/2024   HCT 38.9 10/15/2024   PLT 242 10/15/2024   GLUCOSE 100 (H) 10/15/2024   CHOL 153 08/28/2024   TRIG 240.0 (H) 08/28/2024  HDL 60.00 08/28/2024   LDLDIRECT 115.0 07/05/2019   LDLCALC 45 08/28/2024   ALT 14 10/15/2024   AST 18 10/15/2024   NA 137 10/15/2024   K 4.1 10/15/2024   CL 101 10/15/2024   CREATININE 0.85 10/15/2024   BUN 15 10/15/2024   CO2 23 10/15/2024   TSH 1.67 08/28/2024   HGBA1C 5.8 08/28/2024   EKG NSR at 63 bpm, normal EKG.  Compared to previous EKG rom 2024 PVC, premature supraventricular complexes and nonspecific ST abnormality no longer present.    Assessment & Plan:    See Problem List for Assessment and Plan of chronic medical problems.       [1]  Current Outpatient Medications on File Prior to Visit  Medication Sig Dispense Refill   albuterol  (VENTOLIN  HFA) 108 (90 Base) MCG/ACT inhaler USE 1 INHALATION BY MOUTH EVERY  6 HOURS AS NEEDED FOR WHEEZING  OR SHORTNESS OF BREATH 36 g 3   ascorbic acid   (VITAMIN C ) 500 MG tablet Take 500 mg by mouth daily. (Patient taking differently: Take 250 mg by mouth daily.)     ELDERBERRY PO Take 2 tablets by mouth daily.     estradiol  (ESTRACE ) 0.1 MG/GM vaginal cream Place 0.5g twice a week 42.5 g 11   famotidine  (PEPCID ) 20 MG tablet Take 40 mg by mouth daily.      fluticasone  (FLONASE ) 50 MCG/ACT nasal spray Place 1 spray into both nostrils daily. 1-2 sprays in the left nostril daily     levothyroxine  (SYNTHROID ) 50 MCG tablet TAKE 1 TABLET BY MOUTH IN THE  MORNING 90 tablet 3   montelukast  (SINGULAIR ) 10 MG tablet TAKE 1 TABLET BY MOUTH DAILY 90 tablet 3   oxyCODONE  (ROXICODONE ) 5 MG immediate release tablet Take 1 tablet (5 mg total) by mouth every 4 (four) hours as needed. 5 tablet 0   pravastatin  (PRAVACHOL ) 40 MG tablet TAKE 1 TABLET BY MOUTH AT  BEDTIME 90 tablet 3   primidone  (MYSOLINE ) 50 MG tablet Take 1/2 tablet at night 90 tablet 1   SYMBICORT  80-4.5 MCG/ACT inhaler USE 1 INHALATION BY MOUTH DAILY  PER THE MANUFACTURER, EXPIRES 3  MONTHS AFTER OPENING OVERWRAP 10.2 g 3   Vitamin D , Cholecalciferol , 25 MCG (1000 UT) CAPS Take 1,000 Units by mouth daily.     ciprofloxacin  (CIPRO ) 500 MG tablet Take 1 tablet (500 mg total) by mouth 2 (two) times daily. 28 tablet 0   docusate (COLACE) 50 MG/5ML liquid Take by mouth daily.     metroNIDAZOLE  (FLAGYL ) 500 MG tablet Take 1 tablet (500 mg total) by mouth 2 (two) times daily. 28 tablet 0   No current facility-administered medications on file prior to visit.   "

## 2024-11-02 NOTE — Patient Instructions (Signed)
" ° ° ° ° °  An EKG was done.      Medications changes include :   None      "

## 2024-11-02 NOTE — Patient Instructions (Addendum)
 We have sent the following medications to your pharmacy for you to pick up at your convenience: Ciprofloxacin  500 mg twice day for 14 days  Metronidazole  500 mg twice a day for 14 days.  After your knee surgery recovery, please call to schedule your colonoscopy at the hospital.   Thank you for entrusting me with your care and for choosing [facility], Dr. Inocente Hausen  _______________________________________________________  If your blood pressure at your visit was 140/90 or greater, please contact your primary care physician to follow up on this.  _______________________________________________________  If you are age 82 or older, your body mass index should be between 23-30. Your Body mass index is 29.35 kg/m. If this is out of the aforementioned range listed, please consider follow up with your Primary Care Provider.  If you are age 82 or younger, your body mass index should be between 19-25. Your Body mass index is 29.35 kg/m. If this is out of the aformentioned range listed, please consider follow up with your Primary Care Provider.   ________________________________________________________  The [facility] providers would like to encourage you to use MYCHART to communicate with providers for non-urgent requests or questions.  Due to long hold times on the telephone, sending your provider a message by [facility] may be a faster and more efficient way to get a response.  Please allow 48 business hours for a response.  Please remember that this is for non-urgent requests.  _______________________________________________________  [facility] is using a team-based approach to care.  Your team is made up of your doctor and two to three APPS. Our APPS (Nurse Practitioners and Physician Assistants) work with your physician to ensure care continuity for you. They are fully qualified to address your health concerns and develop a treatment plan. They communicate directly with  your gastroenterologist to care for you. Seeing the Advanced Practice Practitioners on your physician's team can help you by facilitating care more promptly, often allowing for earlier appointments, access to diagnostic testing, procedures, and other specialty referrals.

## 2024-11-02 NOTE — Assessment & Plan Note (Addendum)
 Chronic medical problems stable No history or symptoms consistent with CAD Asthma currently well-controlled   EKG today normal  Low risk for low risk surgery

## 2024-11-03 ENCOUNTER — Ambulatory Visit: Admitting: Internal Medicine

## 2024-11-03 ENCOUNTER — Encounter: Payer: Self-pay | Admitting: Internal Medicine

## 2024-11-03 VITALS — BP 118/64 | HR 65 | Temp 98.1°F | Ht 65.5 in | Wt 176.0 lb

## 2024-11-03 DIAGNOSIS — E7800 Pure hypercholesterolemia, unspecified: Secondary | ICD-10-CM

## 2024-11-03 DIAGNOSIS — E038 Other specified hypothyroidism: Secondary | ICD-10-CM

## 2024-11-03 DIAGNOSIS — J4540 Moderate persistent asthma, uncomplicated: Secondary | ICD-10-CM

## 2024-11-03 DIAGNOSIS — N1831 Chronic kidney disease, stage 3a: Secondary | ICD-10-CM | POA: Diagnosis not present

## 2024-11-03 DIAGNOSIS — R7303 Prediabetes: Secondary | ICD-10-CM

## 2024-11-03 DIAGNOSIS — Z01818 Encounter for other preprocedural examination: Secondary | ICD-10-CM

## 2024-11-03 MED ORDER — CIPROFLOXACIN HCL 500 MG PO TABS: 500.0000 mg | ORAL_TABLET | Freq: Two times a day (BID) | ORAL | 0 refills | Status: AC

## 2024-11-03 MED ORDER — METRONIDAZOLE 500 MG PO TABS: 500.0000 mg | ORAL_TABLET | Freq: Two times a day (BID) | ORAL | 0 refills | Status: AC

## 2024-11-03 NOTE — Assessment & Plan Note (Signed)
 Chronic Lab Results  Component Value Date   HGBA1C 5.8 08/28/2024   Low sugar / carb diet Stressed regular exercise

## 2024-11-03 NOTE — Assessment & Plan Note (Signed)
Chronic Regular exercise and healthy diet encouraged Continue pravastatin 40 mg daily

## 2024-11-03 NOTE — Assessment & Plan Note (Signed)
 Chronic Overall well controlled Ct chest with bronchiectasis Continue Symbicort  80-4.5 mcg/act bid Continue albuterol  inhaler as needed Continue montelukast  10 mg nightly

## 2024-11-03 NOTE — Assessment & Plan Note (Signed)
Chronic  Clinically euthyroid Currently taking levothyroxine 50 mcg daily   

## 2024-11-03 NOTE — Assessment & Plan Note (Signed)
 Chronic Has seen nephrology-no obvious cause for CKD Mild Stable/improved

## 2024-11-06 ENCOUNTER — Encounter: Payer: Self-pay | Admitting: *Deleted

## 2024-11-07 ENCOUNTER — Ambulatory Visit: Admitting: Neurology

## 2024-11-13 ENCOUNTER — Encounter: Payer: Self-pay | Admitting: Pediatrics

## 2024-11-17 ENCOUNTER — Encounter: Payer: Self-pay | Admitting: Internal Medicine

## 2024-11-17 MED ORDER — NITROFURANTOIN MONOHYD MACRO 100 MG PO CAPS: 100.0000 mg | ORAL_CAPSULE | Freq: Two times a day (BID) | ORAL | 0 refills | Status: AC

## 2024-11-23 ENCOUNTER — Encounter: Payer: Self-pay | Admitting: Internal Medicine

## 2024-11-28 ENCOUNTER — Telehealth: Payer: Self-pay | Admitting: Neurology

## 2024-11-28 NOTE — Telephone Encounter (Signed)
 Melissa Lowe( Self) called in to confirm her appt on 12/05/2024. She was under the impression that her appt was scheduled at 2 pm, instead of 1 pm. She stated that is now a scheduling conflict and would like to rs. She was offered the next available and declined.   PH: [phone]

## 2024-12-05 ENCOUNTER — Ambulatory Visit: Admitting: Neurology

## 2025-02-27 ENCOUNTER — Ambulatory Visit: Admitting: Internal Medicine

## 2025-04-19 ENCOUNTER — Ambulatory Visit: Admitting: Neurology
